# Patient Record
Sex: Male | Born: 1945 | Race: Black or African American | Hispanic: No | Smoking: Former smoker
Health system: Southern US, Community
[De-identification: ages and names within clinical notes are randomized; demographics above are authoritative.]

## PROBLEM LIST (undated history)

## (undated) DIAGNOSIS — K5190 Ulcerative colitis, unspecified, without complications: Secondary | ICD-10-CM

## (undated) DIAGNOSIS — E119 Type 2 diabetes mellitus without complications: Secondary | ICD-10-CM

## (undated) DIAGNOSIS — D509 Iron deficiency anemia, unspecified: Secondary | ICD-10-CM

## (undated) DIAGNOSIS — I10 Essential (primary) hypertension: Secondary | ICD-10-CM

## (undated) DIAGNOSIS — N189 Chronic kidney disease, unspecified: Secondary | ICD-10-CM

## (undated) DIAGNOSIS — M1990 Unspecified osteoarthritis, unspecified site: Secondary | ICD-10-CM

## (undated) DIAGNOSIS — S0990XA Unspecified injury of head, initial encounter: Secondary | ICD-10-CM

## (undated) DIAGNOSIS — T63331A Toxic effect of venom of brown recluse spider, accidental (unintentional), initial encounter: Secondary | ICD-10-CM

## (undated) DIAGNOSIS — J302 Other seasonal allergic rhinitis: Secondary | ICD-10-CM

## (undated) DIAGNOSIS — H5440 Blindness, one eye, unspecified eye: Secondary | ICD-10-CM

## (undated) DIAGNOSIS — E039 Hypothyroidism, unspecified: Secondary | ICD-10-CM

## (undated) DIAGNOSIS — Z8489 Family history of other specified conditions: Secondary | ICD-10-CM

## (undated) HISTORY — PX: VASCULAR SURGERY: SHX849

## (undated) HISTORY — PX: CARDIAC CATHETERIZATION: SHX172

## (undated) HISTORY — DX: Blindness, one eye, unspecified eye: H54.40

## (undated) HISTORY — PX: BACK SURGERY: SHX140

## (undated) HISTORY — PX: NASAL SINUS SURGERY: SHX719

## (undated) HISTORY — PX: COLONOSCOPY: SHX5424

## (undated) HISTORY — PX: APPENDECTOMY: SHX54

---
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
## 2013-01-16 DIAGNOSIS — T63331A Toxic effect of venom of brown recluse spider, accidental (unintentional), initial encounter: Secondary | ICD-10-CM

## 2013-01-16 HISTORY — DX: Toxic effect of venom of brown recluse spider, accidental (unintentional), initial encounter: T63.331A

## 2014-01-22 HISTORY — PX: SUBTOTAL COLECTOMY: SHX855

## 2014-01-26 HISTORY — PX: OTHER SURGICAL HISTORY: SHX169

## 2014-02-18 ENCOUNTER — Encounter (HOSPITAL_BASED_OUTPATIENT_CLINIC_OR_DEPARTMENT_OTHER): Payer: Medicare Other | Attending: Surgery

## 2014-02-18 DIAGNOSIS — Z9049 Acquired absence of other specified parts of digestive tract: Secondary | ICD-10-CM | POA: Insufficient documentation

## 2014-02-18 DIAGNOSIS — L97822 Non-pressure chronic ulcer of other part of left lower leg with fat layer exposed: Secondary | ICD-10-CM | POA: Diagnosis present

## 2014-02-18 DIAGNOSIS — M4628 Osteomyelitis of vertebra, sacral and sacrococcygeal region: Secondary | ICD-10-CM | POA: Diagnosis not present

## 2014-02-18 DIAGNOSIS — L89154 Pressure ulcer of sacral region, stage 4: Secondary | ICD-10-CM | POA: Diagnosis not present

## 2014-02-18 DIAGNOSIS — K5190 Ulcerative colitis, unspecified, without complications: Secondary | ICD-10-CM | POA: Diagnosis not present

## 2014-02-18 DIAGNOSIS — Z932 Ileostomy status: Secondary | ICD-10-CM | POA: Diagnosis not present

## 2014-02-18 DIAGNOSIS — E119 Type 2 diabetes mellitus without complications: Secondary | ICD-10-CM | POA: Diagnosis not present

## 2014-02-18 LAB — GLUCOSE, CAPILLARY: GLUCOSE-CAPILLARY: 153 mg/dL — AB (ref 70–99)

## 2014-02-19 NOTE — H&P (Signed)
NAME:  Gabriel Lynch, Gabriel Lynch             ACCOUNT NO.:  [id number]  MEDICAL RECORD NO.:  [id number]  LOCATION:  FOOT                         FACILITY:  [facility]  PHYSICIAN:  Christin Fudge, MD       DATE OF BIRTH:  1945/09/03  DATE OF ADMISSION:  02/18/2014 DATE OF DISCHARGE:                             HISTORY & PHYSICAL   This 69 year old gentleman comes for the first time to the [facility] for a consult recently been rehabbing in [place] in [place].  The patient comes with a chief complaint of having a large decubitus ulcer on the sacral area for about 2 months and a large lower extremity wound on the shin of the left leg for about 6 months.  HISTORY OF PRESENT ILLNESS:  The patient and his sister are the main historians and no papers were sent from the nursing home nor from his previous Psychologist, sport and exercise.  From what I can understand, about 6 months ago, the patient had on the left lower extremity what started off like a small insect bite or a spider bite, which rapidly progressed to a necrotic ulcer.  He was admitted to a hospital and had extensive debridement done of this area.  The patient was then seen in the [facility] locally and this may have been in [place], [place].  The patient continued to get wound care treatment as an outpatient and had hyperbaric treatment and was almost closed.  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  He also had some sacral decubitus problems for which he was operated, but none of these details are available.  All the surgery was approximately in the last month and a half.  The patient says he is now sent for rehab to a nursing home in [place] because his sister stays close by.  Other than that,  the patient is wheelchair bound and does not have any problems except some pain in his back and sides.  PAST MEDICAL HISTORY:  Suggestive of: 1. Ulcerative colitis. 2. Diabetes mellitus type 2. 3. Hypertension. 4. Iron-deficiency anemia. 5. Protein-calorie malnutrition. 6. Hypothyroidism.  PAST SURGICAL HISTORY:  He had a left leg stent with revascularization done in July 2015.  He also had his ileostomy and total colectomy done about a month and a half ago in [place].  ALLERGIES:  TO FLU VACCINES.  MEDICATIONS:  I have reviewed the list of his medications and they include Flomax, furosemide, glipizide, potassium, levothyroxine, amlodipine, metoprolol, MVI, Glucophage, ferrous gluconate, and oxycodone.  SOCIAL HISTORY:  Does not smoke or drink alcohol.  FAMILY HISTORY:  Nothing of significance.  REVIEW OF SYSTEMS:  Reveals that I have done a 12-point review of system and the only positive findings are that of having a painful wound on the left lower extremity and a large wound on the sacral region.  Other than that, he has no problems with eating and drinking and he passes his bowel movements okay in his ileostomy.  He does have some  mucus discharge from the anal canal.  He still has staples in the abdomen. Review of all other systems has been negative.  PHYSICAL EXAMINATION:  GENERAL:  On clinical examination, this is an elderly gentleman awake and alert, lying comfortably in bed. VITAL SIGNS:  Stable and he is afebrile.  He is 6 feet 1 inch in height, weight 151 pounds, afebrile, pulse 110, respirations 20, blood pressure of 141/78, and his blood glucose is 153. HEENT:  Oral cavity is normal.  Sclerae are normal. NECK:  Supple. CVS:  Normal heart sounds. RS:  Air entry equal at both bases. ABDOMEN:  Examination of the abdomen reveals that he still has plenty of staples in his midline wound and a few from laparoscopic trocar entry sites.  On the right lower  quadrant, he has an ileostomy, which is functioning well.  He has a Foley catheter into his penis and this is straining urine.  Other than that, abdomen is soft, nontender. RECTAL:  Deferred. EXTREMITIES:  Examination of the extremities reveals that on his left medial leg, he has an ulcer 4.8 x 1.7 x 0.1 cm in size.  On his leg lower down, he has got a small ulcer 0.6 x 0.8 x 0.1.  This is clean. The large ulcer has significant hypergranulation tissue.  On examination of the sacral region, he has a large pressure ulcer 7.5 x 6.5 x 4.4, and this is a stage IV pressure ulcer with palpable bone and necrotic slough at the base of the ulcer.  The rest of the ulcer has good granulation tissue and this is a nonhealing postsurgical scar.  No details of dressings are available.  The patient has had no other significant wounds open.  PROCEDURE NOTE:  As described to the patient, I have recommended the hypergranulation tissue should be treated with silver nitrate cauterization and he is agreeable.  Using a silver nitrate stick, I have cauterized the entire length of the ulcer on the left lower extremity and I have recommended a silver alginate with foam dressing for this wound.  As far as his sacral wound goes, there is no possibility of debridement as it is extremely tender and there is a lot of slough and this will be need to be debrided in the operating room.  I have recommended application of Kerlix at the present time and we will also apply Santyl to the base packet with Kerlix and recommend a negative pressure vacuum system drainage to be applied to this wound.  No other investigations are available at present time.  ASSESSMENT: 1. Hypergranulation tissue, left lower extremity from a large open     ulcer, which is possibly a nonhealing surgical wound.  I have     recommended silver nitrate and applied this and local dressing will     be applied there. 2. Large sacral decubitus ulcer stage  IV with bone and necrotic debris     at the base, possibly with osteomyelitis.  I have recommended that     the patient should be debrided in the operating room under general     anesthesia and this can be arranged either in [place] or at his     surgeon in [place]. 3. Status post recent surgery about a month ago with a total colectomy     and Hartmann's procedure with a right lower quadrant end-ileostomy.     The patient has several sutures and staples still intact in his     abdomen and these  have not been removed by his primary surgeon.  I     have recommended that they get in touch with his surgeons as soon     as possible and get this taken care of. 4. Ulcerative colitis. 5. Diabetes mellitus. The patient and his sister have had a thorough discussion with me.  They prefer to go back to their surgeon in [facility] for the debridement of his sacral decubitus ulcer.  I have also recommended that till that gets done at the nursing home, a wound VAC to be applied to the wound so that appropriate treatment can be done with the application of Santyl prior to application of the wound VAC.  The dressing for the left lower extremity has also been discussed with them.  If in any case they cannot get back to [facility], we can make arrangements for them to have this procedure done in our hospital at [facility], possibly with a referral to our plastic surgeons, Dr. Migdalia Dk or Dr. Iran Planas.  The patient and his sister have had all questions answered and they are agreeable about the treatment plan.          ______________________________ Christin Fudge, MD     EB/MEDQ  D:  02/18/2014  T:  02/19/2014  Job:  [id number]  cc:   [place]

## 2014-02-25 ENCOUNTER — Other Ambulatory Visit: Payer: Self-pay | Admitting: Certified Registered Nurse Anesthetist

## 2014-02-25 DIAGNOSIS — L89154 Pressure ulcer of sacral region, stage 4: Secondary | ICD-10-CM | POA: Diagnosis not present

## 2014-02-26 NOTE — H&P (Signed)
NAME:  Gabriel Lynch, Gabriel Lynch             ACCOUNT NO.:  [id number]  MEDICAL RECORD NO.:  [id number]  LOCATION:  FOOT                         FACILITY:  [facility]  PHYSICIAN:  Christin Fudge, MD       DATE OF BIRTH:  06/17/1945  DATE OF ADMISSION:  02/18/2014 DATE OF DISCHARGE:                             HISTORY & PHYSICAL   HISTORY OF PRESENT ILLNESS:  This pleasant 69 year old gentleman returns to see [place] with a left lower extremity ulceration and a large stage IV sacral decubitus ulceration of the sacral area.  We have some additional information since last week and most importantly, the patient has gone back for a visit to his surgeon, Dr. Carollee Sires at the [facility] at [place], [place].  Doctor saw him, removed his staples, and has agreed to release him from the present care.  The patient is going to follow up at the [facility] here.  Of note, the patient was admitted at that hospital between January 09, 2014, and January 29, 2014.  During that procedure, he had a flare-up of his ulcerative colitis and had a hand-assisted laparoscopic total colectomy with ileostomy done on January 22, 2014.  He also had a large infected sacral decubitus ulcer and was taken for debridement on January 26, 2014.  He has had transfusion and other supportive care as per the notes and details have been noted.  Other than that, the patient continues to rehab at the nursing home in [place] and is going to follow up with [place] in the [facility] here.  PHYSICAL EXAMINATION:  GENERAL:  On clinical examination today, he is awake and alert, lying comfortably in the bed. VITAL SIGNS:  He has a temperature of 99.3, pulse of 88, respirations of 17, blood pressure of 125/67, and his blood glucose was 114.  He is 6 feet 1 inch in height and 152 pounds in weight. ABDOMEN:  Examination reveals that his abdominal wound is now without staples and he has a functioning ileostomy.  He also has a  Foley catheter in situ. EXTREMITIES:  Examination of his extremities reveal that on the left lower extremity, he has an area 3.4 x 1.9 x 0.1 which is an open wound and shows hypergranulation.  He will need a procedure to be done on this.  On the sacral decubitus area, he has a large open wound 8.1 x 6.0 x 4.4, and has minimal slough and bone at the base of this.  He will need a procedure done here too.  PROCEDURE NOTE:  Under adequate local anesthesia, an appropriate time- out done, the left lower extremity hypergranulation tissue was treated with silver nitrate, chemical cauterization, and the dressing plans were for silver alginate and foam dressing and a light wrap.  As far as a sacral decubitus ulcer goes, the patient has bone visible and in view of that, I have done a debridement of the bone and done this with a rongeur.  The bone pieces have been sent both for culture and for pathology.  Hemostasis was achieved in this area with pressure and with silver nitrate.  Today, we will continue applying Santyl and a packing, but the patient  has a wound VAC available at the nursing home.  ASSESSMENT: 1. Sacral decubitus ulcer stage IV with bone at the base of it,     suspect osteomyelitis. 2. Left lower extremity ulceration with a large open wound nonhealing     with hypergranulation tissue. 3. Status post recent total colectomy and ileostomy. 4. Ulcerative colitis. 5. Diabetes mellitus.  PLAN AND RECOMMENDATIONS:  As noted above, we will continue applying silver alginate and a local dressing to his left lower extremity.  As far as his sacral decubitus goes, we will apply Santyl and then apply white foam over the bone and appropriate foam for the rest of his wound and apply a wound VAC.  He will continue to see [place] in the [facility] and when it is an appropriate time, we will refer him to the plastic surgeons.  The patient and his sister who is his caregiver understand the  treatment plan and agreeable.          ______________________________ Christin Fudge, MD     EB/MEDQ  D:  02/25/2014  T:  02/26/2014  Job:  [id number]  cc:   Wound Care Office

## 2014-03-04 DIAGNOSIS — A419 Sepsis, unspecified organism: Secondary | ICD-10-CM | POA: Diagnosis not present

## 2014-03-04 DIAGNOSIS — E872 Acidosis: Secondary | ICD-10-CM | POA: Diagnosis not present

## 2014-03-05 NOTE — H&P (Signed)
NAME:  Gabriel Lynch, Gabriel Lynch             ACCOUNT NO.:  [id number]  MEDICAL RECORD NO.:  [id number]  LOCATION:  FOOT                         FACILITY:  [facility]  PHYSICIAN:  Christin Fudge, MD       DATE OF BIRTH:  Jun 12, 1945  DATE OF ADMISSION:  02/18/2014 DATE OF DISCHARGE:                             HISTORY & PHYSICAL   This 69 year old gentleman comes for a followup regarding his sacral decubitus wound and his wound on his left lower extremity which has been treated by me a couple of weeks.  He is in a nursing home and as per his sister he is getting a little weak and sleeping a lot and she finds him less alert.  Other than that, details of his previous procedures have been noted in my dictation of February 25, 2014 and no fresh reports are available.  The patient does complain that it is difficult for him to sleep on his sides but he continues to do what he requires to do.  On clinical examination, he is awake and fairly alert but seems to be a little disoriented in his conversation.  He is 6 feet, 152 pounds.  He is afebrile, pulse is 102, respirations 20, and blood pressure 122/67. His blood glucose was 124.  On examination of his left lower extremity, he has a couple of areas and the cluster is 1.2 x 0.8 x 0.1. Superiorly, he has another which is 0.5 x 0.5 x 0.1.  This does not need debridement on his left lower extremity.  His sacral decubitus ulcer is 8.8 x 5.6 x 4.1, and has a lot of slough at the depths and lot of necrotic tissue.  This needs sharp debridement.  PROCEDURE:  After an appropriate time-out and application of lidocaine, all the necrotic tissue excised and this was approximately 3 cm x 2 cm. The post debridement measurement is 8.8 x 5.6 x 4.1.  There was brisk bleeding from some of the cut edges and this was controlled with pressure and with silver nitrate.  The patient tolerated the procedure with some complaints of pain on retracting his skin.  Other than  that, there is no other change.  Of note, last week, when he was here, the following investigations were done.  A bone biopsy from the region of his sacral wound showed acute osteomyelitis.  This was reported date of February 27, 2014.  As far as the culture taken from the wound, it did not grow any significant culture and sensitivity.  CLINICAL IMPRESSION: 1. 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 2. Left lower extremity open ulcer from nonhealing surgical wound     which now looks clean. 3. Status post total colectomy with end ileostomy. 4. Diabetes mellitus.  PLAN AND RECOMMENDATION:  We will continue dressing his left lower extremity with silver alginate and a light wrap.  As far as his sacral decubitus ulcer goes, we will continue with Santyl and then with appropriate white foam, we can use a wound VAC as before.  The patient also needs to be referred to an Infectious Disease specialist regarding his osteomyelitis of the sacrum.  This will be done and I have asked him to followup with [place] in 2 week's time after appropriate management of his Infectious Disease recommendation and continue with the wound VAC.  All questions have been answered, and he will see [place] back in 2 weeks.          ______________________________ Christin Fudge, MD     EB/MEDQ  D:  03/04/2014  T:  03/05/2014  Job:  [id number]

## 2014-03-06 ENCOUNTER — Inpatient Hospital Stay (HOSPITAL_COMMUNITY): Payer: Medicare Other

## 2014-03-06 ENCOUNTER — Encounter (HOSPITAL_COMMUNITY): Payer: Self-pay | Admitting: *Deleted

## 2014-03-06 ENCOUNTER — Inpatient Hospital Stay (HOSPITAL_COMMUNITY)
Admission: EM | Admit: 2014-03-06 | Discharge: 2014-03-10 | DRG: 853 | Disposition: A | Discharge status: Other Acute Inpatient Hospital | Payer: Medicare Other | Attending: Internal Medicine | Admitting: Internal Medicine

## 2014-03-06 ENCOUNTER — Emergency Department (HOSPITAL_COMMUNITY): Payer: Medicare Other

## 2014-03-06 DIAGNOSIS — E11649 Type 2 diabetes mellitus with hypoglycemia without coma: Secondary | ICD-10-CM | POA: Diagnosis not present

## 2014-03-06 DIAGNOSIS — R680 Hypothermia, not associated with low environmental temperature: Secondary | ICD-10-CM | POA: Diagnosis not present

## 2014-03-06 DIAGNOSIS — K8000 Calculus of gallbladder with acute cholecystitis without obstruction: Secondary | ICD-10-CM | POA: Diagnosis present

## 2014-03-06 DIAGNOSIS — K5190 Ulcerative colitis, unspecified, without complications: Secondary | ICD-10-CM | POA: Diagnosis present

## 2014-03-06 DIAGNOSIS — E039 Hypothyroidism, unspecified: Secondary | ICD-10-CM | POA: Diagnosis present

## 2014-03-06 DIAGNOSIS — L89154 Pressure ulcer of sacral region, stage 4: Secondary | ICD-10-CM | POA: Diagnosis present

## 2014-03-06 DIAGNOSIS — M869 Osteomyelitis, unspecified: Secondary | ICD-10-CM | POA: Diagnosis present

## 2014-03-06 DIAGNOSIS — E860 Dehydration: Secondary | ICD-10-CM | POA: Diagnosis present

## 2014-03-06 DIAGNOSIS — E875 Hyperkalemia: Secondary | ICD-10-CM

## 2014-03-06 DIAGNOSIS — T8089XA Other complications following infusion, transfusion and therapeutic injection, initial encounter: Secondary | ICD-10-CM | POA: Diagnosis not present

## 2014-03-06 DIAGNOSIS — L723 Sebaceous cyst: Secondary | ICD-10-CM | POA: Diagnosis present

## 2014-03-06 DIAGNOSIS — Y841 Kidney dialysis as the cause of abnormal reaction of the patient, or of later complication, without mention of misadventure at the time of the procedure: Secondary | ICD-10-CM | POA: Diagnosis not present

## 2014-03-06 DIAGNOSIS — N390 Urinary tract infection, site not specified: Secondary | ICD-10-CM | POA: Diagnosis present

## 2014-03-06 DIAGNOSIS — N179 Acute kidney failure, unspecified: Secondary | ICD-10-CM

## 2014-03-06 DIAGNOSIS — J9601 Acute respiratory failure with hypoxia: Secondary | ICD-10-CM | POA: Diagnosis present

## 2014-03-06 DIAGNOSIS — Z22322 Carrier or suspected carrier of Methicillin resistant Staphylococcus aureus: Secondary | ICD-10-CM

## 2014-03-06 DIAGNOSIS — W19XXXA Unspecified fall, initial encounter: Secondary | ICD-10-CM

## 2014-03-06 DIAGNOSIS — Z79899 Other long term (current) drug therapy: Secondary | ICD-10-CM | POA: Diagnosis not present

## 2014-03-06 DIAGNOSIS — A419 Sepsis, unspecified organism: Secondary | ICD-10-CM | POA: Diagnosis present

## 2014-03-06 DIAGNOSIS — G9340 Encephalopathy, unspecified: Secondary | ICD-10-CM | POA: Diagnosis present

## 2014-03-06 DIAGNOSIS — E872 Acidosis, unspecified: Secondary | ICD-10-CM | POA: Insufficient documentation

## 2014-03-06 DIAGNOSIS — Z932 Ileostomy status: Secondary | ICD-10-CM | POA: Diagnosis not present

## 2014-03-06 DIAGNOSIS — Z993 Dependence on wheelchair: Secondary | ICD-10-CM

## 2014-03-06 DIAGNOSIS — R6521 Severe sepsis with septic shock: Secondary | ICD-10-CM | POA: Diagnosis present

## 2014-03-06 DIAGNOSIS — D638 Anemia in other chronic diseases classified elsewhere: Secondary | ICD-10-CM | POA: Diagnosis present

## 2014-03-06 DIAGNOSIS — Y848 Other medical procedures as the cause of abnormal reaction of the patient, or of later complication, without mention of misadventure at the time of the procedure: Secondary | ICD-10-CM | POA: Diagnosis not present

## 2014-03-06 DIAGNOSIS — T829XXA Unspecified complication of cardiac and vascular prosthetic device, implant and graft, initial encounter: Secondary | ICD-10-CM | POA: Insufficient documentation

## 2014-03-06 DIAGNOSIS — R4182 Altered mental status, unspecified: Secondary | ICD-10-CM

## 2014-03-06 DIAGNOSIS — T82818A Embolism of vascular prosthetic devices, implants and grafts, initial encounter: Secondary | ICD-10-CM | POA: Diagnosis not present

## 2014-03-06 DIAGNOSIS — K810 Acute cholecystitis: Secondary | ICD-10-CM | POA: Diagnosis present

## 2014-03-06 DIAGNOSIS — T68XXXA Hypothermia, initial encounter: Secondary | ICD-10-CM

## 2014-03-06 DIAGNOSIS — N170 Acute kidney failure with tubular necrosis: Secondary | ICD-10-CM | POA: Diagnosis present

## 2014-03-06 DIAGNOSIS — M7989 Other specified soft tissue disorders: Secondary | ICD-10-CM | POA: Diagnosis present

## 2014-03-06 DIAGNOSIS — I10 Essential (primary) hypertension: Secondary | ICD-10-CM | POA: Diagnosis present

## 2014-03-06 HISTORY — DX: Ulcerative colitis, unspecified, without complications: K51.90

## 2014-03-06 HISTORY — DX: Iron deficiency anemia, unspecified: D50.9

## 2014-03-06 HISTORY — DX: Essential (primary) hypertension: I10

## 2014-03-06 HISTORY — DX: Type 2 diabetes mellitus without complications: E11.9

## 2014-03-06 HISTORY — DX: Hypothyroidism, unspecified: E03.9

## 2014-03-06 LAB — POCT I-STAT 7, (LYTES, BLD GAS, ICA,H+H)
ACID-BASE DEFICIT: 17 mmol/L — AB (ref 0.0–2.0)
BICARBONATE: 10.6 meq/L — AB (ref 20.0–24.0)
Calcium, Ion: 1.45 mmol/L — ABNORMAL HIGH (ref 1.13–1.30)
HEMATOCRIT: 25 % — AB (ref 39.0–52.0)
HEMOGLOBIN: 8.5 g/dL — AB (ref 13.0–17.0)
O2 Saturation: 96 %
Patient temperature: 83
Potassium: 5.4 mmol/L — ABNORMAL HIGH (ref 3.5–5.1)
Sodium: 130 mmol/L — ABNORMAL LOW (ref 135–145)
TCO2: 12 mmol/L (ref 0–100)
pCO2 arterial: 21.8 mmHg — ABNORMAL LOW (ref 35.0–45.0)
pH, Arterial: 7.243 — ABNORMAL LOW (ref 7.350–7.450)
pO2, Arterial: 68 mmHg — ABNORMAL LOW (ref 80.0–100.0)

## 2014-03-06 LAB — DIFFERENTIAL
BASOS PCT: 0 % (ref 0–1)
Basophils Absolute: 0 10*3/uL (ref 0.0–0.1)
EOS PCT: 0 % (ref 0–5)
Eosinophils Absolute: 0 10*3/uL (ref 0.0–0.7)
LYMPHS ABS: 2.1 10*3/uL (ref 0.7–4.0)
Lymphocytes Relative: 4 % — ABNORMAL LOW (ref 12–46)
MONOS PCT: 8 % (ref 3–12)
Monocytes Absolute: 4.2 10*3/uL — ABNORMAL HIGH (ref 0.1–1.0)
NEUTROS ABS: 46.3 10*3/uL — AB (ref 1.7–7.7)
Neutrophils Relative %: 88 % — ABNORMAL HIGH (ref 43–77)

## 2014-03-06 LAB — CBC
HCT: 23.1 % — ABNORMAL LOW (ref 39.0–52.0)
HEMATOCRIT: 30.8 % — AB (ref 39.0–52.0)
HEMOGLOBIN: 8.8 g/dL — AB (ref 13.0–17.0)
Hemoglobin: 7 g/dL — ABNORMAL LOW (ref 13.0–17.0)
MCH: 27.2 pg (ref 26.0–34.0)
MCH: 27.8 pg (ref 26.0–34.0)
MCHC: 28.6 g/dL — ABNORMAL LOW (ref 30.0–36.0)
MCHC: 30.3 g/dL (ref 30.0–36.0)
MCV: 95.4 fL (ref 78.0–100.0)
MCV: 91.7 fL (ref 78.0–100.0)
PLATELETS: 699 10*3/uL — AB (ref 150–400)
Platelets: 1036 10*3/uL (ref 150–400)
RBC: 3.23 MIL/uL — ABNORMAL LOW (ref 4.22–5.81)
RBC: 2.52 MIL/uL — ABNORMAL LOW (ref 4.22–5.81)
RDW: 17.4 % — ABNORMAL HIGH (ref 11.5–15.5)
RDW: 17 % — ABNORMAL HIGH (ref 11.5–15.5)
WBC: 52.6 10*3/uL (ref 4.0–10.5)
WBC: 49 10*3/uL — AB (ref 4.0–10.5)

## 2014-03-06 LAB — RAPID URINE DRUG SCREEN, HOSP PERFORMED
Amphetamines: NOT DETECTED
BENZODIAZEPINES: NOT DETECTED
Barbiturates: NOT DETECTED
Cocaine: NOT DETECTED
Opiates: NOT DETECTED
TETRAHYDROCANNABINOL: NOT DETECTED

## 2014-03-06 LAB — URINE MICROSCOPIC-ADD ON

## 2014-03-06 LAB — MRSA PCR SCREENING: MRSA BY PCR: POSITIVE — AB

## 2014-03-06 LAB — GLUCOSE, CAPILLARY
GLUCOSE-CAPILLARY: 154 mg/dL — AB (ref 70–99)
GLUCOSE-CAPILLARY: 121 mg/dL — AB (ref 70–99)
Glucose-Capillary: 303 mg/dL — ABNORMAL HIGH (ref 70–99)
Glucose-Capillary: 201 mg/dL — ABNORMAL HIGH (ref 70–99)
Glucose-Capillary: 103 mg/dL — ABNORMAL HIGH (ref 70–99)
Glucose-Capillary: 115 mg/dL — ABNORMAL HIGH (ref 70–99)
Glucose-Capillary: 81 mg/dL (ref 70–99)
Glucose-Capillary: 71 mg/dL (ref 70–99)

## 2014-03-06 LAB — RENAL FUNCTION PANEL
ALBUMIN: 2 g/dL — AB (ref 3.5–5.2)
Anion gap: 24 — ABNORMAL HIGH (ref 5–15)
BUN: 94 mg/dL — ABNORMAL HIGH (ref 6–23)
CO2: 13 mmol/L — ABNORMAL LOW (ref 19–32)
Calcium: 9.8 mg/dL (ref 8.4–10.5)
Chloride: 96 mmol/L (ref 96–112)
Creatinine, Ser: 6.07 mg/dL — ABNORMAL HIGH (ref 0.50–1.35)
GFR calc Af Amer: 10 mL/min — ABNORMAL LOW (ref >90)
GFR calc non Af Amer: 9 mL/min — ABNORMAL LOW (ref >90)
Glucose, Bld: 236 mg/dL — ABNORMAL HIGH (ref 70–99)
PHOSPHORUS: 7.6 mg/dL — AB (ref 2.3–4.6)
Potassium: 5.5 mmol/L — ABNORMAL HIGH (ref 3.5–5.1)
SODIUM: 133 mmol/L — AB (ref 135–145)

## 2014-03-06 LAB — BLOOD GAS, ARTERIAL
Acid-base deficit: 14 mmol/L — ABNORMAL HIGH (ref 0.0–2.0)
BICARBONATE: 11.6 meq/L — AB (ref 20.0–24.0)
Drawn by: 39899
FIO2: 0.3 %
MECHVT: 600 mL
O2 Saturation: 97.2 %
PEEP: 5 cmH2O
PH ART: 7.268 — AB (ref 7.350–7.450)
PO2 ART: 113 mmHg — AB (ref 80.0–100.0)
Patient temperature: 97.9
RATE: 18 resp/min
TCO2: 12.4 mmol/L (ref 0–100)
pCO2 arterial: 26.2 mmHg — ABNORMAL LOW (ref 35.0–45.0)

## 2014-03-06 LAB — COMPREHENSIVE METABOLIC PANEL
ALT: 20 U/L (ref 0–53)
AST: 27 U/L (ref 0–37)
Albumin: 2.6 g/dL — ABNORMAL LOW (ref 3.5–5.2)
Alkaline Phosphatase: 296 U/L — ABNORMAL HIGH (ref 39–117)
Anion gap: 25 — ABNORMAL HIGH (ref 5–15)
BUN: 118 mg/dL — ABNORMAL HIGH (ref 6–23)
CALCIUM: 10.7 mg/dL — AB (ref 8.4–10.5)
CO2: 7 mmol/L — CL (ref 19–32)
CREATININE: 8.59 mg/dL — AB (ref 0.50–1.35)
Chloride: 96 mmol/L (ref 96–112)
GFR calc Af Amer: 7 mL/min — ABNORMAL LOW (ref >90)
GFR, EST NON AFRICAN AMERICAN: 6 mL/min — AB (ref >90)
Glucose, Bld: 185 mg/dL — ABNORMAL HIGH (ref 70–99)
Potassium: >7.5 mmol/L (ref 3.5–5.1)
SODIUM: 128 mmol/L — AB (ref 135–145)
Total Bilirubin: 0.3 mg/dL (ref 0.3–1.2)
Total Protein: 7.6 g/dL (ref 6.0–8.3)

## 2014-03-06 LAB — I-STAT ARTERIAL BLOOD GAS, ED
ACID-BASE DEFICIT: 28 mmol/L — AB (ref 0.0–2.0)
Acid-base deficit: 28 mmol/L — ABNORMAL HIGH (ref 0.0–2.0)
BICARBONATE: 4.5 meq/L — AB (ref 20.0–24.0)
Bicarbonate: 5 mEq/L — ABNORMAL LOW (ref 20.0–24.0)
O2 SAT: 100 %
O2 SAT: 99 %
PCO2 ART: 33.4 mmHg — AB (ref 35.0–45.0)
PH ART: 6.785 — AB (ref 7.350–7.450)
PO2 ART: 540 mmHg — AB (ref 80.0–100.0)
TCO2: 6 mmol/L (ref 0–100)
TCO2: 5 mmol/L (ref 0–100)
pCO2 arterial: 21.9 mmHg — ABNORMAL LOW (ref 35.0–45.0)
pH, Arterial: 6.87 — CL (ref 7.350–7.450)
pO2, Arterial: 195 mmHg — ABNORMAL HIGH (ref 80.0–100.0)

## 2014-03-06 LAB — BASIC METABOLIC PANEL
ANION GAP: 19 — AB (ref 5–15)
Anion gap: 23 — ABNORMAL HIGH (ref 5–15)
BUN: 105 mg/dL — ABNORMAL HIGH (ref 6–23)
BUN: 70 mg/dL — AB (ref 6–23)
CO2: 9 mmol/L — AB (ref 19–32)
CO2: 19 mmol/L (ref 19–32)
Calcium: 10.8 mg/dL — ABNORMAL HIGH (ref 8.4–10.5)
Calcium: 8.6 mg/dL (ref 8.4–10.5)
Chloride: 105 mmol/L (ref 96–112)
Chloride: 90 mmol/L — ABNORMAL LOW (ref 96–112)
Creatinine, Ser: 6.75 mg/dL — ABNORMAL HIGH (ref 0.50–1.35)
Creatinine, Ser: 4.39 mg/dL — ABNORMAL HIGH (ref 0.50–1.35)
GFR calc Af Amer: 9 mL/min — ABNORMAL LOW (ref >90)
GFR calc Af Amer: 15 mL/min — ABNORMAL LOW (ref >90)
GFR calc non Af Amer: 8 mL/min — ABNORMAL LOW (ref >90)
GFR calc non Af Amer: 13 mL/min — ABNORMAL LOW (ref >90)
GLUCOSE: 439 mg/dL — AB (ref 70–99)
Glucose, Bld: 67 mg/dL — ABNORMAL LOW (ref 70–99)
Potassium: 6.2 mmol/L (ref 3.5–5.1)
Potassium: 4.2 mmol/L (ref 3.5–5.1)
SODIUM: 133 mmol/L — AB (ref 135–145)
SODIUM: 132 mmol/L — AB (ref 135–145)

## 2014-03-06 LAB — ETHANOL: Alcohol, Ethyl (B): <5 mg/dL (ref 0–9)

## 2014-03-06 LAB — LACTIC ACID, PLASMA
LACTIC ACID, VENOUS: 8.9 mmol/L — AB (ref 0.5–2.0)
Lactic Acid, Venous: 8.6 mmol/L (ref 0.5–2.0)

## 2014-03-06 LAB — I-STAT TROPONIN, ED: TROPONIN I, POC: 0.03 ng/mL (ref 0.00–0.08)

## 2014-03-06 LAB — URINALYSIS, ROUTINE W REFLEX MICROSCOPIC
BILIRUBIN URINE: NEGATIVE
Glucose, UA: 250 mg/dL — AB
KETONES UR: 15 mg/dL — AB
NITRITE: NEGATIVE
Protein, ur: >300 mg/dL — AB
Specific Gravity, Urine: 1.036 — ABNORMAL HIGH (ref 1.005–1.030)
UROBILINOGEN UA: 0.2 mg/dL (ref 0.0–1.0)
pH: 7 (ref 5.0–8.0)

## 2014-03-06 LAB — CBG MONITORING, ED
GLUCOSE-CAPILLARY: 254 mg/dL — AB (ref 70–99)
Glucose-Capillary: 358 mg/dL — ABNORMAL HIGH (ref 70–99)

## 2014-03-06 LAB — CARBOXYHEMOGLOBIN
Carboxyhemoglobin: 0.7 % (ref 0.5–1.5)
Methemoglobin: 1.6 % — ABNORMAL HIGH (ref 0.0–1.5)
O2 SAT: 72.2 %
TOTAL HEMOGLOBIN: 8.9 g/dL — AB (ref 13.5–18.0)

## 2014-03-06 LAB — I-STAT CG4 LACTIC ACID, ED
LACTIC ACID, VENOUS: 12.3 mmol/L — AB (ref 0.5–2.0)
LACTIC ACID, VENOUS: 10.7 mmol/L — AB (ref 0.5–2.0)

## 2014-03-06 LAB — TSH: TSH: 1.174 u[IU]/mL (ref 0.350–4.500)

## 2014-03-06 LAB — PROTIME-INR
INR: 1.54 — AB (ref 0.00–1.49)
Prothrombin Time: 18.6 seconds — ABNORMAL HIGH (ref 11.6–15.2)

## 2014-03-06 LAB — APTT: aPTT: 42 seconds — ABNORMAL HIGH (ref 24–37)

## 2014-03-06 LAB — ABO/RH: ABO/RH(D): A POS

## 2014-03-06 LAB — TROPONIN I
TROPONIN I: 0.05 ng/mL — AB (ref <0.031)
Troponin I: <0.03 ng/mL (ref <0.031)
Troponin I: 0.05 ng/mL — ABNORMAL HIGH (ref <0.031)

## 2014-03-06 LAB — PROCALCITONIN: PROCALCITONIN: 8.31 ng/mL

## 2014-03-06 MED ORDER — VASOPRESSIN 20 UNIT/ML IV SOLN
0.0300 [IU]/min | INTRAVENOUS | Status: DC
  Administered 2014-03-06 (×2): 0.03 [IU]/min via INTRAVENOUS
  Filled 2014-03-06 (×2): qty 2

## 2014-03-06 MED ORDER — DEXTROSE 50 % IV SOLN
50.0000 mL | Freq: Once | INTRAVENOUS | Status: AC
  Administered 2014-03-06: 50 mL via INTRAVENOUS
  Filled 2014-03-06: qty 50

## 2014-03-06 MED ORDER — INSULIN ASPART 100 UNIT/ML ~~LOC~~ SOLN
10.0000 [IU] | Freq: Once | SUBCUTANEOUS | Status: AC
  Administered 2014-03-06: 10 [IU] via INTRAVENOUS
  Filled 2014-03-06: qty 1

## 2014-03-06 MED ORDER — ROCURONIUM BROMIDE 50 MG/5ML IV SOLN
INTRAVENOUS | Status: DC | PRN
  Administered 2014-03-06: 80 mg via INTRAVENOUS

## 2014-03-06 MED ORDER — HEPARIN BOLUS VIA INFUSION (CRRT)
1000.0000 [IU] | INTRAVENOUS | Status: DC | PRN
  Administered 2014-03-06 (×6): 1000 [IU] via INTRAVENOUS_CENTRAL
  Filled 2014-03-06 (×7): qty 1000

## 2014-03-06 MED ORDER — INSULIN ASPART 100 UNIT/ML ~~LOC~~ SOLN
2.0000 [IU] | SUBCUTANEOUS | Status: DC
  Administered 2014-03-06: 6 [IU] via SUBCUTANEOUS

## 2014-03-06 MED ORDER — VANCOMYCIN HCL IN DEXTROSE 750-5 MG/150ML-% IV SOLN
750.0000 mg | INTRAVENOUS | Status: DC
  Administered 2014-03-07 – 2014-03-09 (×3): 750 mg via INTRAVENOUS
  Filled 2014-03-06 (×3): qty 150

## 2014-03-06 MED ORDER — PRISMASOL BGK 4/2.5 32-4-2.5 MEQ/L IV SOLN
INTRAVENOUS | Status: DC
  Administered 2014-03-07 – 2014-03-08 (×8): via INTRAVENOUS_CENTRAL
  Filled 2014-03-06 (×25): qty 5000

## 2014-03-06 MED ORDER — PIPERACILLIN-TAZOBACTAM 3.375 G IVPB: 3.3750 g | Freq: Once | INTRAVENOUS | Status: DC

## 2014-03-06 MED ORDER — DEXTROSE 50 % IV SOLN
50.0000 mL | Freq: Once | INTRAVENOUS | Status: AC
  Administered 2014-03-06: 50 mL via INTRAVENOUS

## 2014-03-06 MED ORDER — HEPARIN SODIUM (PORCINE) 5000 UNIT/ML IJ SOLN
5000.0000 [IU] | Freq: Three times a day (TID) | INTRAMUSCULAR | Status: DC
  Administered 2014-03-06 – 2014-03-07 (×5): 5000 [IU] via SUBCUTANEOUS
  Filled 2014-03-06 (×10): qty 1

## 2014-03-06 MED ORDER — CHLORHEXIDINE GLUCONATE 0.12 % MT SOLN
15.0000 mL | Freq: Two times a day (BID) | OROMUCOSAL | Status: DC
  Administered 2014-03-06 – 2014-03-08 (×4): 15 mL via OROMUCOSAL
  Filled 2014-03-06 (×4): qty 15

## 2014-03-06 MED ORDER — SODIUM CHLORIDE 0.9 % IV BOLUS (SEPSIS)
2000.0000 mL | Freq: Once | INTRAVENOUS | Status: AC
  Administered 2014-03-06: 2000 mL via INTRAVENOUS

## 2014-03-06 MED ORDER — SODIUM CHLORIDE 0.9 % IV SOLN: 250.0000 mL | INTRAVENOUS | Status: DC | PRN

## 2014-03-06 MED ORDER — SODIUM CHLORIDE 0.9 % IJ SOLN
250.0000 [IU]/h | INTRAMUSCULAR | Status: DC
  Administered 2014-03-06: 450 [IU]/h via INTRAVENOUS_CENTRAL
  Administered 2014-03-06: 1550 [IU]/h via INTRAVENOUS_CENTRAL
  Administered 2014-03-07: 2150 [IU]/h via INTRAVENOUS_CENTRAL
  Administered 2014-03-07 (×2): 1650 [IU]/h via INTRAVENOUS_CENTRAL
  Administered 2014-03-07: 1750 [IU]/h via INTRAVENOUS_CENTRAL
  Administered 2014-03-08: 2200 [IU]/h via INTRAVENOUS_CENTRAL
  Filled 2014-03-06 (×10): qty 2

## 2014-03-06 MED ORDER — VASOPRESSIN 20 UNIT/ML IV SOLN
0.2000 [IU]/min | INTRAVENOUS | Status: DC
  Filled 2014-03-06: qty 5

## 2014-03-06 MED ORDER — HEPARIN SODIUM (PORCINE) 1000 UNIT/ML DIALYSIS
1000.0000 [IU] | INTRAMUSCULAR | Status: DC | PRN
  Filled 2014-03-06: qty 6
  Filled 2014-03-06: qty 3

## 2014-03-06 MED ORDER — DEXTROSE 50 % IV SOLN
INTRAVENOUS | Status: AC
  Administered 2014-03-06: 50 mL via INTRAVENOUS
  Filled 2014-03-06: qty 50

## 2014-03-06 MED ORDER — SODIUM BICARBONATE 8.4 % IV SOLN
100.0000 meq | Freq: Once | INTRAVENOUS | Status: AC
  Administered 2014-03-06: 100 meq via INTRAVENOUS

## 2014-03-06 MED ORDER — LIDOCAINE HCL 1 % IJ SOLN
INTRAMUSCULAR | Status: AC
  Filled 2014-03-06: qty 20

## 2014-03-06 MED ORDER — PIPERACILLIN-TAZOBACTAM 3.375 G IVPB 30 MIN
3.3750 g | Freq: Once | INTRAVENOUS | Status: AC
Start: 2014-03-06 — End: 2014-03-06
  Administered 2014-03-06: 3.375 g via INTRAVENOUS
  Filled 2014-03-06: qty 50

## 2014-03-06 MED ORDER — ETOMIDATE 2 MG/ML IV SOLN
INTRAVENOUS | Status: DC | PRN
  Administered 2014-03-06: 10 mg via INTRAVENOUS

## 2014-03-06 MED ORDER — SODIUM CHLORIDE 0.9 % IV BOLUS (SEPSIS): 1000.0000 mL | Freq: Once | INTRAVENOUS | Status: DC

## 2014-03-06 MED ORDER — SODIUM BICARBONATE 8.4 % IV SOLN
50.0000 meq | Freq: Once | INTRAVENOUS | Status: AC
  Administered 2014-03-06: 50 meq via INTRAVENOUS

## 2014-03-06 MED ORDER — VASOPRESSIN 20 UNIT/ML IV SOLN: 0.0300 [IU]/min | INTRAVENOUS | Status: DC

## 2014-03-06 MED ORDER — SODIUM CHLORIDE 0.9 % IV BOLUS (SEPSIS)
1000.0000 mL | Freq: Once | INTRAVENOUS | Status: AC
  Administered 2014-03-06: 1000 mL via INTRAVENOUS

## 2014-03-06 MED ORDER — CEFEPIME HCL 2 G IJ SOLR
2.0000 g | Freq: Two times a day (BID) | INTRAMUSCULAR | Status: DC
  Administered 2014-03-06 – 2014-03-09 (×6): 2 g via INTRAVENOUS
  Filled 2014-03-06 (×7): qty 2

## 2014-03-06 MED ORDER — STERILE WATER FOR INJECTION IV SOLN
INTRAVENOUS | Status: DC
  Administered 2014-03-06 – 2014-03-07 (×3): via INTRAVENOUS_CENTRAL
  Filled 2014-03-06 (×7): qty 150

## 2014-03-06 MED ORDER — MUPIROCIN 2 % EX OINT
1.0000 "application " | TOPICAL_OINTMENT | Freq: Two times a day (BID) | CUTANEOUS | Status: DC
  Administered 2014-03-06 – 2014-03-10 (×9): 1 via NASAL
  Filled 2014-03-06 (×3): qty 22

## 2014-03-06 MED ORDER — PANTOPRAZOLE SODIUM 40 MG IV SOLR
40.0000 mg | Freq: Every day | INTRAVENOUS | Status: DC
  Administered 2014-03-06 – 2014-03-07 (×2): 40 mg via INTRAVENOUS
  Filled 2014-03-06 (×3): qty 40

## 2014-03-06 MED ORDER — HEPARIN SODIUM (PORCINE) 1000 UNIT/ML DIALYSIS
1000.0000 [IU] | INTRAMUSCULAR | Status: DC | PRN
  Filled 2014-03-06: qty 6

## 2014-03-06 MED ORDER — SODIUM CHLORIDE 0.9 % IV SOLN
INTRAVENOUS | Status: DC
  Administered 2014-03-06: 1.4 [IU]/h via INTRAVENOUS
  Filled 2014-03-06: qty 2.5

## 2014-03-06 MED ORDER — CHLORHEXIDINE GLUCONATE CLOTH 2 % EX PADS
6.0000 | MEDICATED_PAD | Freq: Every day | CUTANEOUS | Status: DC
  Administered 2014-03-06 – 2014-03-10 (×4): 6 via TOPICAL

## 2014-03-06 MED ORDER — HYDROCORTISONE NA SUCCINATE PF 100 MG IJ SOLR
100.0000 mg | Freq: Once | INTRAMUSCULAR | Status: AC
  Administered 2014-03-06: 100 mg via INTRAVENOUS
  Filled 2014-03-06: qty 2

## 2014-03-06 MED ORDER — CETYLPYRIDINIUM CHLORIDE 0.05 % MT LIQD
7.0000 mL | Freq: Four times a day (QID) | OROMUCOSAL | Status: DC
  Administered 2014-03-07 – 2014-03-08 (×7): 7 mL via OROMUCOSAL

## 2014-03-06 MED ORDER — STERILE WATER FOR INJECTION IV SOLN
INTRAVENOUS | Status: DC
  Administered 2014-03-06 – 2014-03-07 (×5): via INTRAVENOUS_CENTRAL
  Filled 2014-03-06 (×9): qty 150

## 2014-03-06 MED ORDER — SODIUM CHLORIDE 0.9 % IV SOLN: INTRAVENOUS | Status: DC

## 2014-03-06 MED ORDER — HEPARIN (PORCINE) 2000 UNITS/L FOR CRRT
INTRAVENOUS_CENTRAL | Status: DC | PRN
  Administered 2014-03-06: 16:00:00 via INTRAVENOUS_CENTRAL
  Filled 2014-03-06 (×2): qty 1000

## 2014-03-06 MED ORDER — PRISMASOL BGK 4/2.5 32-4-2.5 MEQ/L IV SOLN
INTRAVENOUS | Status: DC
Start: 2014-03-06 — End: 2014-03-06
  Administered 2014-03-06: via INTRAVENOUS_CENTRAL
  Filled 2014-03-06 (×4): qty 5000

## 2014-03-06 MED ORDER — SODIUM POLYSTYRENE SULFONATE 15 GM/60ML PO SUSP: 60.0000 g | Freq: Three times a day (TID) | ORAL | Status: AC

## 2014-03-06 MED ORDER — SODIUM CHLORIDE 0.9 % IV SOLN
1250.0000 mg | Freq: Once | INTRAVENOUS | Status: AC
  Administered 2014-03-06: 1250 mg via INTRAVENOUS
  Filled 2014-03-06: qty 1250

## 2014-03-06 MED ORDER — INSULIN ASPART 100 UNIT/ML ~~LOC~~ SOLN
10.0000 [IU] | Freq: Once | SUBCUTANEOUS | Status: AC
  Administered 2014-03-06: 10 [IU] via SUBCUTANEOUS

## 2014-03-06 MED ORDER — DEXTROSE 50 % IV SOLN
2.0000 | Freq: Once | INTRAVENOUS | Status: AC
  Administered 2014-03-06: 100 mL via INTRAVENOUS

## 2014-03-06 MED ORDER — DEXTROSE 50 % IV SOLN
INTRAVENOUS | Status: AC
  Filled 2014-03-06: qty 50

## 2014-03-06 MED ORDER — FENTANYL CITRATE 0.05 MG/ML IJ SOLN
50.0000 ug | INTRAMUSCULAR | Status: AC | PRN
Start: 2014-03-06 — End: 2014-03-06
  Administered 2014-03-06 (×3): 50 ug via INTRAVENOUS
  Filled 2014-03-06 (×2): qty 2

## 2014-03-06 MED ORDER — SODIUM BICARBONATE 8.4 % IV SOLN
INTRAVENOUS | Status: DC
  Administered 2014-03-06: 09:00:00 via INTRAVENOUS
  Filled 2014-03-06 (×3): qty 150

## 2014-03-06 MED ORDER — FENTANYL CITRATE 0.05 MG/ML IJ SOLN
50.0000 ug | INTRAMUSCULAR | Status: DC | PRN
  Administered 2014-03-06 – 2014-03-09 (×7): 50 ug via INTRAVENOUS
  Filled 2014-03-06 (×8): qty 2

## 2014-03-06 MED ORDER — CALCIUM CHLORIDE 10 % IV SOLN
1.0000 g | Freq: Once | INTRAVENOUS | Status: AC
  Administered 2014-03-06: 1 g via INTRAVENOUS

## 2014-03-06 MED ORDER — PRISMASOL BGK 0/2.5 32-2.5 MEQ/L IV SOLN
INTRAVENOUS | Status: DC
  Administered 2014-03-06 (×4): via INTRAVENOUS_CENTRAL
  Filled 2014-03-06 (×9): qty 5000

## 2014-03-06 MED ORDER — SODIUM BICARBONATE 8.4 % IV SOLN
INTRAVENOUS | Status: AC
  Administered 2014-03-06: 100 meq via INTRAVENOUS
  Filled 2014-03-06: qty 100

## 2014-03-06 MED ORDER — DEXTROSE 5 % IV SOLN
1.0000 g | Freq: Once | INTRAVENOUS | Status: AC
  Administered 2014-03-06: 1 g via INTRAVENOUS
  Filled 2014-03-06: qty 1

## 2014-03-06 MED ORDER — CALCIUM CHLORIDE 10 % IV SOLN
INTRAVENOUS | Status: DC | PRN
  Administered 2014-03-06: 2 g via INTRAVENOUS

## 2014-03-06 MED ORDER — NOREPINEPHRINE BITARTRATE 1 MG/ML IV SOLN
5.0000 ug/min | INTRAVENOUS | Status: DC
  Administered 2014-03-06 (×2): 20 ug/min via INTRAVENOUS
  Administered 2014-03-06: 8 ug/min via INTRAVENOUS
  Filled 2014-03-06 (×4): qty 4

## 2014-03-06 MED ORDER — HEPARIN (PORCINE) 2000 UNITS/L FOR CRRT
INTRAVENOUS_CENTRAL | Status: DC | PRN
  Administered 2014-03-06: 12:00:00 via INTRAVENOUS_CENTRAL
  Filled 2014-03-06 (×2): qty 1000

## 2014-03-06 MED ORDER — NOREPINEPHRINE BITARTRATE 1 MG/ML IV SOLN
0.0000 ug/min | Freq: Once | INTRAVENOUS | Status: AC
  Administered 2014-03-06: 4 ug/min via INTRAVENOUS

## 2014-03-06 MED ORDER — INSULIN ASPART 100 UNIT/ML ~~LOC~~ SOLN
0.0000 [IU] | SUBCUTANEOUS | Status: DC
  Administered 2014-03-07 – 2014-03-08 (×2): 2 [IU] via SUBCUTANEOUS
  Administered 2014-03-08: 3 [IU] via SUBCUTANEOUS
  Administered 2014-03-09: 5 [IU] via SUBCUTANEOUS
  Administered 2014-03-09: 3 [IU] via SUBCUTANEOUS
  Administered 2014-03-09 – 2014-03-10 (×3): 2 [IU] via SUBCUTANEOUS
  Administered 2014-03-10: 3 [IU] via SUBCUTANEOUS

## 2014-03-06 MED FILL — Medication: Qty: 1 | Status: AC

## 2014-03-06 NOTE — ED Notes (Signed)
Cancelled Code Stroke @ 0600

## 2014-03-06 NOTE — Progress Notes (Signed)
Vent changes made per CCM order. Will wait 15 minutes and obtain ABG.

## 2014-03-06 NOTE — ED Notes (Signed)
Code Stroke cancelled.  

## 2014-03-06 NOTE — Progress Notes (Signed)
Patients wound vac from home given to son Ruthann Cancer to take home

## 2014-03-06 NOTE — Progress Notes (Signed)
CRITICAL VALUE ALERT  Critical value received:  K 6.2   CO2 9  Date of notification:  03/06/2014  Time of notification:  4034  Critical value read back: yes  Nurse who received alert:  Audie Pinto  MD notified (1st page):  Elsworth Soho at bedsdie  Time of first page:    MD notified (2nd page):  Time of second page:  Responding MD:  Elsworth Soho at bedside  Time MD responded:  1100

## 2014-03-06 NOTE — Consult Note (Signed)
Gabriel Lynch [birth year]  [id number].   Primary Care MD: unknown Requesting MD: Dr. Kara Mead Chief Complaint/Reason for Consult: sacral decubitus ulcer and cholecystitis HPI: This is a 69 yo black male who is very critically ill.  He is intubated and unresponsive.  Therefore, most of the history is obtained from the various family members that are at the bedside.  The patient has a h/o UC for around 7 years.  He was seen by a GI doctor and placed on medications.  He has not followed up well with his GI doctor, per his son, but he has continued his medications.  In December, he got very sick with copious amounts of bloody stool.  He was admitted to his local hospital where he underwent a partial colectomy.  It sounds like he had a transverse and left colectomy with a right sided colostomy.  The son states, "they removed 2/3 of his colon and gave him a colostomy with hopes to reverse it at [facility] later."  However, his CT scan here reports this is a right-sided ileostomy and he is s/p subtotal colectomy (which would be more appropriate for UC).  This surgery was on 01/22/14.  4 days later he went back to the OR for debridement of his sacral decubitus ulcer.  On 01-30-14, he was transferred from his outside hospital to [place] for rehab.  He sounds like he was initially improving at rehab, but the sister states she thinks he has been a little confused for at least the last week.  His urine has been dark and smelling.  He was finally started on Cipro yesterday for a UTI.  He saw the wound clinic about a week ago and he had a VAC put on his sacral wound.  The sister states that evan at this time, his wound smelt and was "dark" looking.    The patient essentially became more disoriented today and then unresponsive.  He was brought to [facility] where he is intubated with a core temp of 87 degrees, septic shock, severely acidotic, etc.  He has essentially been pan scanned.  His CT of the abdomen reveals acute  cholecystitis.  The patient is no longer making urine and is now on CRRT.  He has elevated T-waves from severe hyperkalemia.  He also has a Stage IV sacral decubitus ulcer.  We have been asked to evaluate the patient for further recommendations.  ROS : unable to obtain due to unresponsive nature and intubation  History reviewed. No pertinent family history.  Past Medical History  Diagnosis Date  . Ulcerative colitis   . Diabetes mellitus without complication   . Hypertension   . Iron deficiency anemia   . Hypothyroidism     Past Surgical History  Procedure Laterality Date  . Back surgery      multiple surgeries per family  . 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 Appendectomy    . Debridement of sacral decubitus ulcer  01-26-14    Social History:  reports that he does not drink alcohol or use illicit drugs. His tobacco history is not on file.  Allergies:  Allergies  Allergen Reactions  . Influenza Vaccines Other (See Comments)    ON MAR    Medications Prior to Admission  Medication Sig Dispense Refill  . acetaminophen (TYLENOL) 325 MG tablet Take 650 mg by mouth every 4 (four) hours as needed for mild pain, fever or headache.    . Amino Acids-Protein Hydrolys (FEEDING SUPPLEMENT,  PRO-STAT SUGAR FREE 64,) LIQD Take 30 mLs by mouth 2 (two) times daily.    Marland Kitchen amLODipine (NORVASC) 5 MG tablet Take 5 mg by mouth daily.    . feeding supplement, GLUCERNA SHAKE, (GLUCERNA SHAKE) LIQD Take 237 mLs by mouth 2 (two) times daily between meals.    . ferrous gluconate (FERGON) 324 MG tablet Take 324 mg by mouth 3 (three) times daily.    . furosemide (LASIX) 20 MG tablet Take 20 mg by mouth daily.    Marland Kitchen glipiZIDE (GLUCOTROL) 10 MG tablet Take 10 mg by mouth daily before breakfast.    . levothyroxine (SYNTHROID, LEVOTHROID) 100 MCG tablet Take 100 mcg by mouth daily before breakfast.    . metFORMIN (GLUCOPHAGE) 1000 MG tablet Take 1,000 mg by mouth 2 (two) times daily with a meal.     . metoprolol succinate (TOPROL-XL) 100 MG 24 hr tablet Take 100 mg by mouth daily. Take with or immediately following a meal.    . Multiple Vitamin (MULTIVITAMIN WITH MINERALS) TABS tablet Take 1 tablet by mouth daily.    . Oxycodone HCl 10 MG TABS Take 1 mg by mouth every 4 (four) hours.    . potassium chloride (K-DUR,KLOR-CON) 10 MEQ tablet Take 10 mEq by mouth daily.    . tamsulosin (FLOMAX) 0.4 MG CAPS capsule Take 0.4 mg by mouth daily after breakfast.      Blood pressure 118/45, pulse 72, temperature 90.1 F (32.3 C), temperature source Oral, resp. rate 17, weight 133 lb 2.5 oz (60.4 kg), SpO2 100 %. Physical Exam: General: malnourished, very ill-appearing black male who is unresponsive HEENT: head is normocephalic, atraumatic.  Ears and nose without any masses or lesions.  Mouth is pink.  ETT in place Heart: regular, rate, and rhythm, but brady down in the 30s earlier in the day.  Normal s1,s2. No obvious murmurs, gallops, or rubs noted.  Palpable radial and pedal pulses bilaterally Lungs: CTAB, no wheezes, rhonchi, or rales noted.  Respiratory effort nonlabored Abd: soft, NT, ND, absent BS, no masses, hernias, or organomegaly, right sided ileostomy in place with no output, bag just changed though.  Stoma is pink and viable MS: all 4 extremities are symmetrical with no cyanosis, clubbing, or edema.  He does have some wounds on his left leg.  Dressings were not removed by myself Skin: cool and dry with no masses, lesions, or rashes, stage IV sacral decubitus ulcer that is about 9x8cm.  His sacrum is exposed.  This wound is 100% necrotic.  The muscle is necrotic, the tissue at the base and around his bone is all necrotic.  It is malodorous.   Psych: unresponsive, unsedated on the vent    Results for orders placed or performed during the hospital encounter of 03/06/14 (from the past 48 hour(s))  Ethanol     Status: None   Collection Time: 03/06/14  5:54 AM  Result Value Ref Range    Alcohol, Ethyl (B) <5 0 - 9 mg/dL    Comment:        LOWEST DETECTABLE LIMIT FOR SERUM ALCOHOL IS 11 mg/dL FOR MEDICAL PURPOSES ONLY   I-Stat Troponin, ED (not at [facility])     Status: None   Collection Time: 03/06/14  5:54 AM  Result Value Ref Range   Troponin i, poc 0.03 0.00 - 0.08 ng/mL   Comment 3            Comment: Due to the release kinetics of cTnI, a negative result  within the first hours of the onset of symptoms does not rule out myocardial infarction with certainty. If myocardial infarction is still suspected, repeat the test at appropriate intervals.   Protime-INR     Status: Abnormal   Collection Time: 03/06/14  5:54 AM  Result Value Ref Range   Prothrombin Time 18.6 (H) 11.6 - 15.2 seconds   INR 1.54 (H) 0.00 - 1.49  APTT     Status: Abnormal   Collection Time: 03/06/14  5:54 AM  Result Value Ref Range   aPTT 42 (H) 24 - 37 seconds    Comment:        IF BASELINE aPTT IS ELEVATED, SUGGEST PATIENT RISK ASSESSMENT BE USED TO DETERMINE APPROPRIATE ANTICOAGULANT THERAPY.   CBC     Status: Abnormal   Collection Time: 03/06/14  5:54 AM  Result Value Ref Range   WBC 52.6 (HH) 4.0 - 10.5 K/uL    Comment: WHITE COUNT CONFIRMED ON SMEAR CRITICAL RESULT CALLED TO, READ BACK BY AND VERIFIED WITH: JAMES CAMP,RN AT 3790 03/06/14 BY ZBEECH.    RBC 3.23 (L) 4.22 - 5.81 MIL/uL   Hemoglobin 8.8 (L) 13.0 - 17.0 g/dL   HCT 30.8 (L) 39.0 - 52.0 %   MCV 95.4 78.0 - 100.0 fL   MCH 27.2 26.0 - 34.0 pg   MCHC 28.6 (L) 30.0 - 36.0 g/dL   RDW 17.4 (H) 11.5 - 15.5 %   Platelets 1036 (HH) 150 - 400 K/uL    Comment: PLATELET COUNT CONFIRMED BY SMEAR CRITICAL RESULT CALLED TO, READ BACK BY AND VERIFIED WITH: JAMES CAMP,RN AT 2409 03/06/14 BY ZBEECH.   Differential     Status: Abnormal   Collection Time: 03/06/14  5:54 AM  Result Value Ref Range   Neutrophils Relative % 88 (H) 43 - 77 %   Lymphocytes Relative 4 (L) 12 - 46 %   Monocytes Relative 8 3 - 12 %   Eosinophils Relative 0 0  - 5 %   Basophils Relative 0 0 - 1 %   Neutro Abs 46.3 (H) 1.7 - 7.7 K/uL   Lymphs Abs 2.1 0.7 - 4.0 K/uL   Monocytes Absolute 4.2 (H) 0.1 - 1.0 K/uL   Eosinophils Absolute 0.0 0.0 - 0.7 K/uL   Basophils Absolute 0.0 0.0 - 0.1 K/uL   RBC Morphology POLYCHROMASIA PRESENT    WBC Morphology MILD LEFT SHIFT (1-5% METAS, OCC MYELO, OCC BANDS)   Comprehensive metabolic panel     Status: Abnormal   Collection Time: 03/06/14  5:54 AM  Result Value Ref Range   Sodium 128 (L) 135 - 145 mmol/L   Potassium >7.5 (HH) 3.5 - 5.1 mmol/L    Comment: NO VISIBLE HEMOLYSIS REPEATED TO VERIFY CRITICAL RESULT CALLED TO, READ BACK BY AND VERIFIED WITH: B.MICHAELSON,RN 03/06/14 0712 BY BSLADE    Chloride 96 96 - 112 mmol/L   CO2 7 (LL) 19 - 32 mmol/L    Comment: REPEATED TO VERIFY CRITICAL RESULT CALLED TO, READ BACK BY AND VERIFIED WITH: B.MICHAELSON,RN 03/06/14 0712 BY BSLADE    Glucose, Bld 185 (H) 70 - 99 mg/dL   BUN 118 (H) 6 - 23 mg/dL   Creatinine, Ser 8.59 (H) 0.50 - 1.35 mg/dL   Calcium 10.7 (H) 8.4 - 10.5 mg/dL   Total Protein 7.6 6.0 - 8.3 g/dL   Albumin 2.6 (L) 3.5 - 5.2 g/dL   AST 27 0 - 37 U/L   ALT 20 0 - 53 U/L  Alkaline Phosphatase 296 (H) 39 - 117 U/L   Total Bilirubin 0.3 0.3 - 1.2 mg/dL   GFR calc non Af Amer 6 (L) >90 mL/min   GFR calc Af Amer 7 (L) >90 mL/min    Comment: (NOTE) The eGFR has been calculated using the CKD EPI equation. This calculation has not been validated in all clinical situations. eGFR's persistently <90 mL/min signify possible Chronic Kidney Disease.    Anion gap 25 (H) 5 - 15  Type and screen     Status: None   Collection Time: 03/06/14  5:54 AM  Result Value Ref Range   ABO/RH(D) A POS    Antibody Screen NEG    Sample Expiration 03/09/2014   ABO/Rh     Status: None   Collection Time: 03/06/14  5:54 AM  Result Value Ref Range   ABO/RH(D) A POS   I-Stat CG4 Lactic Acid, ED     Status: Abnormal   Collection Time: 03/06/14  5:55 AM  Result Value  Ref Range   Lactic Acid, Venous 12.30 (HH) 0.5 - 2.0 mmol/L   Comment NOTIFIED PHYSICIAN   CBG monitoring, ED     Status: Abnormal   Collection Time: 03/06/14  6:47 AM  Result Value Ref Range   Glucose-Capillary 254 (H) 70 - 99 mg/dL  I-Stat arterial blood gas, ED     Status: Abnormal   Collection Time: 03/06/14  6:49 AM  Result Value Ref Range   pH, Arterial 6.785 (LL) 7.350 - 7.450   pCO2 arterial 33.4 (L) 35.0 - 45.0 mmHg   pO2, Arterial 540.0 (H) 80.0 - 100.0 mmHg   Bicarbonate 5.0 (L) 20.0 - 24.0 mEq/L   TCO2 6 0 - 100 mmol/L   O2 Saturation 100.0 %   Acid-base deficit 28.0 (H) 0.0 - 2.0 mmol/L   Sample type ARTERIAL    Comment NOTIFIED PHYSICIAN   TSH     Status: None   Collection Time: 03/06/14  7:21 AM  Result Value Ref Range   TSH 1.174 0.350 - 4.500 uIU/mL  CBC     Status: Abnormal   Collection Time: 03/06/14  7:38 AM  Result Value Ref Range   WBC 49.0 (H) 4.0 - 10.5 K/uL    Comment: REPEATED TO VERIFY   RBC 2.52 (L) 4.22 - 5.81 MIL/uL   Hemoglobin 7.0 (L) 13.0 - 17.0 g/dL    Comment: REPEATED TO VERIFY   HCT 23.1 (L) 39.0 - 52.0 %   MCV 91.7 78.0 - 100.0 fL   MCH 27.8 26.0 - 34.0 pg   MCHC 30.3 30.0 - 36.0 g/dL   RDW 17.0 (H) 11.5 - 15.5 %   Platelets 699 (H) 150 - 400 K/uL    Comment: SPECIMEN CHECKED FOR CLOTS REPEATED TO VERIFY   Basic metabolic panel     Status: Abnormal   Collection Time: 03/06/14  7:38 AM  Result Value Ref Range   Sodium 133 (L) 135 - 145 mmol/L   Potassium 6.2 (HH) 3.5 - 5.1 mmol/L    Comment: REPEATED TO VERIFY CRITICAL RESULT CALLED TO, READ BACK BY AND VERIFIED WITH: S.HARDY,RN 03/06/14 1040 BY BSLADE    Chloride 105 96 - 112 mmol/L   CO2 9 (LL) 19 - 32 mmol/L    Comment: REPEATED TO VERIFY CRITICAL RESULT CALLED TO, READ BACK BY AND VERIFIED WITH: S.HARDY,RN 03/06/14 1040 BY BSLADE    Glucose, Bld 439 (H) 70 - 99 mg/dL   BUN 105 (H) 6 - 23  mg/dL   Creatinine, Ser 6.75 (H) 0.50 - 1.35 mg/dL   Calcium 10.8 (H) 8.4 - 10.5  mg/dL   GFR calc non Af Amer 8 (L) >90 mL/min   GFR calc Af Amer 9 (L) >90 mL/min    Comment: (NOTE) The eGFR has been calculated using the CKD EPI equation. This calculation has not been validated in all clinical situations. eGFR's persistently <90 mL/min signify possible Chronic Kidney Disease.    Anion gap 19 (H) 5 - 15  Troponin I     Status: None   Collection Time: 03/06/14  7:38 AM  Result Value Ref Range   Troponin I <0.03 <0.031 ng/mL    Comment:        NO INDICATION OF MYOCARDIAL INJURY.   Lactic acid, plasma     Status: Abnormal   Collection Time: 03/06/14  7:39 AM  Result Value Ref Range   Lactic Acid, Venous 8.6 (HH) 0.5 - 2.0 mmol/L    Comment: REPEATED TO VERIFY CRITICAL RESULT CALLED TO, READ BACK BY AND VERIFIED WITH: S.HARDY,RN 03/06/14 1122 BY BSLADE RESULTS CONFIRMED BY MANUAL DILUTION   I-Stat CG4 Lactic Acid, ED     Status: Abnormal   Collection Time: 03/06/14  7:44 AM  Result Value Ref Range   Lactic Acid, Venous 10.70 (HH) 0.5 - 2.0 mmol/L   Comment NOTIFIED PHYSICIAN   Procalcitonin     Status: None   Collection Time: 03/06/14  8:00 AM  Result Value Ref Range   Procalcitonin 8.31 ng/mL    Comment:        Interpretation: PCT > 2 ng/mL: Systemic infection (sepsis) is likely, unless other causes are known. (NOTE)         ICU PCT Algorithm               Non ICU PCT Algorithm    ----------------------------     ------------------------------         PCT < 0.25 ng/mL                 PCT < 0.1 ng/mL     Stopping of antibiotics            Stopping of antibiotics       strongly encouraged.               strongly encouraged.    ----------------------------     ------------------------------       PCT level decrease by               PCT < 0.25 ng/mL       >= 80% from peak PCT       OR PCT 0.25 - 0.5 ng/mL          Stopping of antibiotics                                             encouraged.     Stopping of antibiotics           encouraged.     ----------------------------     ------------------------------       PCT level decrease by              PCT >= 0.25 ng/mL       < 80% from peak PCT        AND PCT >= 0.5 ng/mL  Continuing antibiotics                                               encouraged.       Continuing antibiotics            encouraged.    ----------------------------     ------------------------------     PCT level increase compared          PCT > 0.5 ng/mL         with peak PCT AND          PCT >= 0.5 ng/mL             Escalation of antibiotics                                          strongly encouraged.      Escalation of antibiotics        strongly encouraged.   I-Stat arterial blood gas, ED     Status: Abnormal   Collection Time: 03/06/14  8:11 AM  Result Value Ref Range   pH, Arterial 6.870 (LL) 7.350 - 7.450   pCO2 arterial 21.9 (L) 35.0 - 45.0 mmHg   pO2, Arterial 195.0 (H) 80.0 - 100.0 mmHg   Bicarbonate 4.5 (L) 20.0 - 24.0 mEq/L   TCO2 5 0 - 100 mmol/L   O2 Saturation 99.0 %   Acid-base deficit 28.0 (H) 0.0 - 2.0 mmol/L   Patient temperature 30.5 C    Collection site RADIAL, ALLEN'S TEST ACCEPTABLE    Drawn by RT    Sample type ARTERIAL    Comment NOTIFIED PHYSICIAN   CBG monitoring, ED     Status: Abnormal   Collection Time: 03/06/14  8:12 AM  Result Value Ref Range   Glucose-Capillary 358 (H) 70 - 99 mg/dL  MRSA PCR Screening     Status: Abnormal   Collection Time: 03/06/14  9:41 AM  Result Value Ref Range   MRSA by PCR POSITIVE (A) NEGATIVE    Comment:        The GeneXpert MRSA Assay (FDA approved for NASAL specimens only), is one component of a comprehensive MRSA colonization surveillance program. It is not intended to diagnose MRSA infection nor to guide or monitor treatment for MRSA infections. RESULT CALLED TO, READ BACK BY AND VERIFIED WITH: S. HARDY RN 11:40 03/06/14 ([email or web address])   Urine Drug Screen     Status: None   Collection Time: 03/06/14 11:00 AM  Result  Value Ref Range   Opiates NONE DETECTED NONE DETECTED   Cocaine NONE DETECTED NONE DETECTED   Benzodiazepines NONE DETECTED NONE DETECTED   Amphetamines NONE DETECTED NONE DETECTED   Tetrahydrocannabinol NONE DETECTED NONE DETECTED   Barbiturates NONE DETECTED NONE DETECTED    Comment:        DRUG SCREEN FOR MEDICAL PURPOSES ONLY.  IF CONFIRMATION IS NEEDED FOR ANY PURPOSE, NOTIFY LAB WITHIN 5 DAYS.        LOWEST DETECTABLE LIMITS FOR URINE DRUG SCREEN Drug Class       Cutoff (ng/mL) Amphetamine      1000 Barbiturate      200 Benzodiazepine   779 Tricyclics       390 Opiates  300 Cocaine          300 THC              50   Urinalysis, Routine w reflex microscopic     Status: Abnormal   Collection Time: 03/06/14 11:00 AM  Result Value Ref Range   Color, Urine YELLOW YELLOW    Comment: URINALYSIS PERFORMED ON SUPERNATANT LESS THAN 10 mL OF URINE SUBMITTED URINE IS GROSSLY BLOODY    APPearance CLOUDY (A) CLEAR   Specific Gravity, Urine 1.036 (H) 1.005 - 1.030   pH 7.0 5.0 - 8.0   Glucose, UA 250 (A) NEGATIVE mg/dL   Hgb urine dipstick LARGE (A) NEGATIVE   Bilirubin Urine NEGATIVE NEGATIVE   Ketones, ur 15 (A) NEGATIVE mg/dL   Protein, ur >300 (A) NEGATIVE mg/dL   Urobilinogen, UA 0.2 0.0 - 1.0 mg/dL   Nitrite NEGATIVE NEGATIVE   Leukocytes, UA SMALL (A) NEGATIVE  Urine microscopic-add on     Status: Abnormal   Collection Time: 03/06/14 11:00 AM  Result Value Ref Range   Squamous Epithelial / LPF RARE RARE   WBC, UA TOO NUMEROUS TO COUNT <3 WBC/hpf   RBC / HPF TOO NUMEROUS TO COUNT <3 RBC/hpf   Bacteria, UA MANY (A) RARE  Glucose, capillary     Status: Abnormal   Collection Time: 03/06/14 11:16 AM  Result Value Ref Range   Glucose-Capillary 303 (H) 70 - 99 mg/dL   Ct Abdomen Pelvis Wo Contrast  03/06/2014   CLINICAL DATA:  Patient unresponsive and intubated. Concern for sepsis with low body temperature. History of ulcerative colitis.  EXAM: CT CHEST, ABDOMEN  AND PELVIS WITHOUT CONTRAST  TECHNIQUE: Multidetector CT imaging of the chest, abdomen and pelvis was performed following the standard protocol without IV contrast.  COMPARISON:  Chest x-ray ant [phone] hr  FINDINGS: CT CHEST FINDINGS  Endotracheal tube extends into the mid trachea. A nasogastric tube extends into the stomach. There is no evidence of pneumothorax with lungs demonstrating mild COPD and bleb formation in the peripheral lungs bilaterally. Lower lung zones demonstrate atelectasis and probable component of bilateral lower lobe mucous plugging in the posterior lower lobe bronchi. No pleural fluid is identified.  The heart size is normal. No pericardial effusion. The thoracic aorta shows atherosclerotic plaque without aneurysmal disease. A tiny amount of calcified plaque is suspected in the distribution of the LAD and distal left circumflex coronary arteries. The thyroid gland is unremarkable. No evidence of pneumomediastinum or mediastinal mass. No lymphadenopathy identified. Bony structures are unremarkable.  CT ABDOMEN AND PELVIS FINDINGS  Acute cholecystitis is suspected with significant amount of fluid surrounding the gallbladder. Although the gallbladder is not overtly distended, there may be subtle underlying gallstones. Some free fluid tracks around the medial edge of the liver and extends up to the liver dome. There are inflammatory changes in the region of the gastrohepatic ligament and also in the left upper quadrant immediately inferior to the stomach. This appearance is nonspecific. Recommend correlation with laboratory evidence of pancreatitis. The pancreas itself appears fairly unremarkable by unenhanced CT.  There is evidence of an ileostomy in the right lower abdomen. The patient appears to be status post prior subtotal colectomy with only a rectum remaining. There is no evidence of bowel perforation or focal abscess. No free intraperitoneal air identified.  Small bowel loops are of normal  caliber. No masses or lymphadenopathy identified in the abdomen or pelvis. No hernias are seen. The bladder contains a Foley catheter  with some increased density seen surrounding the Foley catheter balloon. This may be consistent with some hole line in the bladder. Correlation suggested with urinary tract. Underlying bladder lesion cannot be excluded by unenhanced CT.  IMPRESSION: 1. Chest demonstrates mucous plug formation and atelectasis in both posterior lower lobes. There is underlying COPD. No pneumothorax is identified. 2. Mild coronary atherosclerotic disease suspected with calcified plaque in the distal LAD and left circumflex territory. 3. Evidence of probable acute cholecystitis with free fluid surrounding the gallbladder. Cholelithiasis is suspected. Ultrasound correlation may be helpful. Some free fluid tracks around the liver. 4. Nonspecific inflammatory changes in the region of the gastrohepatic ligament and inferior to the stomach. Some of this may be postoperative in nature given evidence of prior subtotal colectomy. Recommend correlation with any laboratory evidence of pancreatitis. 5. No evidence of bowel perforation or obstruction. Status post subtotal colectomy. Right-sided ileostomy site appears uncomplicated. 6. High density in the bladder adjacent to the Foley catheter balloon. This may be reflective of blood given that IV contrast was not administered. Underlying bladder lesion cannot be excluded.   Electronically Signed   By: Aletta Edouard M.D.   On: 03/06/2014 09:22   Ct Head Wo Contrast  03/06/2014   ADDENDUM REPORT: 03/06/2014 09:18  ADDENDUM: On axial slice 56 series 902, there is a 1.6 x 1.3 cm mass in the superficial soft tissues at the level of the angle of the mandible on the left. Suspect sebaceous cyst, although a focal lymph node could present in this manner.   Electronically Signed   By: Lowella Grip III M.D.   On: 03/06/2014 09:18   03/06/2014   CLINICAL DATA:   Patient unresponsive  EXAM: CT HEAD WITHOUT CONTRAST  CT CERVICAL SPINE WITHOUT CONTRAST  TECHNIQUE: Multidetector CT imaging of the head and cervical spine was performed following the standard protocol without intravenous contrast. Multiplanar CT image reconstructions of the cervical spine were also generated.  COMPARISON:  None.  FINDINGS: CT HEAD FINDINGS  The ventricles are normal in size and configuration. There is no appreciable intracranial mass, hemorrhage, extra-axial fluid collection, or midline shift. There is a probable prominent prevascular space just lateral to the inferior aspect of the posterior limb of the left internal capsule. Gray-white compartments appear normal. No acute infarct apparent. Bony calvarium appears intact. The mastoid air cells are clear. The patient has had an antrostomy on the left. There is mucosal thickening in the inferior left maxillary antrum. There is a well-circumscribed rounded bony defect in the upper anterior hard palate. 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 There is evidence of previous surgery involving the anterior aspect of the right maxillary antrum wall.  CT CERVICAL SPINE FINDINGS  There is no fracture or spondylolisthesis. Prevertebral soft tissues and predental space regions are normal. Patient is intubated.  There is moderate disc space narrowing at C4-5, C5-6, and C6-7. There is mild narrowing at C3-4. There are central disc protrusions causing impression on the ventral cord at C3-4, C4-5, and C5-6. A smaller central disc protrusion is noted at C2-3. There is a degree of spinal stenosis due to the central disc protrusions at C3-4, C4-5, and C5-6. No erosive change or bony destruction.  IMPRESSION: CT head: No intracranial mass, hemorrhage, or extra-axial fluid collection. No focal gray-white compartment lesions/acute appearing infarct. Postoperative change in the face. Prosthetic right globe. Well-circumscribed bony defect anterior hard palate. Etiology  for this defect is uncertain. It should be amenable to visual inspection.  CT cervical spine: Multifocal osteoarthritic change. There is a degree of spinal stenosis due to central disc protrusion at C3-4, C4-5, and C5-6. No fracture or spondylolisthesis. No erosive change or bony destruction.  Electronically Signed: By: Lowella Grip III M.D. On: 03/06/2014 09:14   Ct Chest Wo Contrast  03/06/2014   CLINICAL DATA:  Patient unresponsive and intubated. Concern for sepsis with low body temperature. History of ulcerative colitis.  EXAM: CT CHEST, ABDOMEN AND PELVIS WITHOUT CONTRAST  TECHNIQUE: Multidetector CT imaging of the chest, abdomen and pelvis was performed following the standard protocol without IV contrast.  COMPARISON:  Chest x-ray ant [phone] [id number]  FINDINGS: CT CHEST FINDINGS  Endotracheal tube extends into the mid trachea. A nasogastric tube extends into the stomach. There is no evidence of pneumothorax with lungs demonstrating mild COPD and bleb formation in the peripheral lungs bilaterally. Lower lung zones demonstrate atelectasis and probable component of bilateral lower lobe mucous plugging in the posterior lower lobe bronchi. No pleural fluid is identified.  The heart size is normal. No pericardial effusion. The thoracic aorta shows atherosclerotic plaque without aneurysmal disease. A tiny amount of calcified plaque is suspected in the distribution of the LAD and distal left circumflex coronary arteries. The thyroid gland is unremarkable. No evidence of pneumomediastinum or mediastinal mass. No lymphadenopathy identified. Bony structures are unremarkable.  CT ABDOMEN AND PELVIS FINDINGS  Acute cholecystitis is suspected with significant amount of fluid surrounding the gallbladder. Although the gallbladder is not overtly distended, there may be subtle underlying gallstones. Some free fluid tracks around the medial edge of the liver and extends up to the liver dome. There are inflammatory changes in the  region of the gastrohepatic ligament and also in the left upper quadrant immediately inferior to the stomach. This appearance is nonspecific. Recommend correlation with laboratory evidence of pancreatitis. The pancreas itself appears fairly unremarkable by unenhanced CT.  There is evidence of an ileostomy in the right lower abdomen. The patient appears to be status post prior subtotal colectomy with only a rectum remaining. There is no evidence of bowel perforation or focal abscess. No free intraperitoneal air identified.  Small bowel loops are of normal caliber. No masses or lymphadenopathy identified in the abdomen or pelvis. No hernias are seen. The bladder contains a Foley catheter with some increased density seen surrounding the Foley catheter balloon. This may be consistent with some hole line in the bladder. Correlation suggested with urinary tract. Underlying bladder lesion cannot be excluded by unenhanced CT.  IMPRESSION: 1. Chest demonstrates mucous plug formation and atelectasis in both posterior lower lobes. There is underlying COPD. No pneumothorax is identified. 2. Mild coronary atherosclerotic disease suspected with calcified plaque in the distal LAD and left circumflex territory. 3. Evidence of probable acute cholecystitis with free fluid surrounding the gallbladder. Cholelithiasis is suspected. Ultrasound correlation may be helpful. Some free fluid tracks around the liver. 4. Nonspecific inflammatory changes in the region of the gastrohepatic ligament and inferior to the stomach. Some of this may be postoperative in nature given evidence of prior subtotal colectomy. Recommend correlation with any laboratory evidence of pancreatitis. 5. No evidence of bowel perforation or obstruction. Status post subtotal colectomy. Right-sided ileostomy site appears uncomplicated. 6. High density in the bladder adjacent to the Foley catheter balloon. This may be reflective of blood given that IV contrast was not  administered. Underlying bladder lesion cannot be excluded.   Electronically Signed   By: Aletta Edouard M.D.   On: 03/06/2014 09:22  Ct Cervical Spine Wo Contrast  03/06/2014   ADDENDUM REPORT: 03/06/2014 09:18  ADDENDUM: On axial slice 56 series 373, there is a 1.6 x 1.3 cm mass in the superficial soft tissues at the level of the angle of the mandible on the left. Suspect sebaceous cyst, although a focal lymph node could present in this manner.   Electronically Signed   By: Lowella Grip III M.D.   On: 03/06/2014 09:18   03/06/2014   CLINICAL DATA:  Patient unresponsive  EXAM: CT HEAD WITHOUT CONTRAST  CT CERVICAL SPINE WITHOUT CONTRAST  TECHNIQUE: Multidetector CT imaging of the head and cervical spine was performed following the standard protocol without intravenous contrast. Multiplanar CT image reconstructions of the cervical spine were also generated.  COMPARISON:  None.  FINDINGS: CT HEAD FINDINGS  The ventricles are normal in size and configuration. There is no appreciable intracranial mass, hemorrhage, extra-axial fluid collection, or midline shift. There is a probable prominent prevascular space just lateral to the inferior aspect of the posterior limb of the left internal capsule. Gray-white compartments appear normal. No acute infarct apparent. Bony calvarium appears intact. The mastoid air cells are clear. The patient has had an antrostomy on the left. There is mucosal thickening in the inferior left maxillary antrum. There is a well-circumscribed rounded bony defect in the upper anterior hard palate. 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 There is evidence of previous surgery involving the anterior aspect of the right maxillary antrum wall.  CT CERVICAL SPINE FINDINGS  There is no fracture or spondylolisthesis. Prevertebral soft tissues and predental space regions are normal. Patient is intubated.  There is moderate disc space narrowing at C4-5, C5-6, and C6-7. There is mild narrowing at  C3-4. There are central disc protrusions causing impression on the ventral cord at C3-4, C4-5, and C5-6. A smaller central disc protrusion is noted at C2-3. There is a degree of spinal stenosis due to the central disc protrusions at C3-4, C4-5, and C5-6. No erosive change or bony destruction.  IMPRESSION: CT head: No intracranial mass, hemorrhage, or extra-axial fluid collection. No focal gray-white compartment lesions/acute appearing infarct. Postoperative change in the face. Prosthetic right globe. Well-circumscribed bony defect anterior hard palate. Etiology for this defect is uncertain. It should be amenable to visual inspection.  CT cervical spine: Multifocal osteoarthritic change. There is a degree of spinal stenosis due to central disc protrusion at C3-4, C4-5, and C5-6. No fracture or spondylolisthesis. No erosive change or bony destruction.  Electronically Signed: By: Lowella Grip III M.D. On: 03/06/2014 09:14   Dg Chest Port 1 View  03/06/2014   CLINICAL DATA:  Central catheter placement  EXAM: PORTABLE CHEST - 1 VIEW  COMPARISON:  Chest radiograph and chest CT obtained earlier in the day  FINDINGS: Central catheter tip is in the superior vena cava. Endotracheal tube tip is 4.7 cm above the carina. Nasogastric tube tip and side port are in the stomach. No pneumothorax. There is patchy bibasilar atelectatic change. Elsewhere lungs are clear. Heart size and pulmonary vascularity are normal. No adenopathy.  IMPRESSION: Tube and catheter positions as described without pneumothorax. Patchy bibasilar atelectatic change. Lungs elsewhere clear. No change in cardiac silhouette.   Electronically Signed   By: Lowella Grip III M.D.   On: 03/06/2014 10:01   Dg Chest Portable 1 View  03/06/2014   CLINICAL DATA:  ET and OG tube placement.  EXAM: PORTABLE CHEST - 1 VIEW  COMPARISON:  None.  FINDINGS: Endotracheal  tube is at the level of the clavicular heads. Orogastric tube extends into the stomach. The  lungs are expanded. There is a deep costophrenic angle on the right, suggesting a small pneumothorax but this is not confirmed by identification of the pleural line. No large pneumothorax is evident. No effusion is evident.  IMPRESSION: ET and OG tube as described.  Questionable small right pneumothorax.   Electronically Signed   By: Andreas Newport M.D.   On: 03/06/2014 06:44       Assessment/Plan 1. Septic shock, likely multifactorial 2. Acute cholecystitis 3. ? UTI (per sister from results at rehab) 4. 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 ARF, on CRRT, no urine output 6. Hyperkalemia 7. H/o UC, recent surgery 5 weeks ago 8. H/o HTN 9. DM 10. Hypothermia  11. POD 43, s/p subtotal colectomy with ileostomy 12. Lower extremity wound from spider bite  Plan: 1. This patient is critically ill with hypothermia, temp 87.  He likely has multiple sources of infection.  He was just recently diagnosed with a UTI at the nursing facility.  He has acute cholecystitis as well as necrotic sacral wound with presumed osteomyelitis secondary to exposed bone.  All of these things are likely contributing his his overall poor state.  IR has been consulted to see if they can place a percutaneous cholecystostomy drain.  Agree with broad spectrum abx therapy given the multiple possible sources.  His is severely acidotic and his K is finally starting to come down.  When he is stable enough, per CCM, he can have a perc chole drain placed, unless IR felt this could be done at the bedside.  I discussed this with the family.  I also explained to them that this may not make him 100% better because of the possibility of multiple sources.  They understand that.   2. At some point, the patient may benefit from debridement of his sacral wound.   However, he is so critically ill at this point, that will have to be addressed later if he survives.  It is impossible to tell if just the  visible tissue is necrotic or if he may have more necrosis that tracks further.  For now, just WD dressing changes to this wound.  A VAC should NOT be replaced back to this wound! 3. Thank you for this consultation.  We will follow along with you.  I think this patient has a very poor prognosis based off of how critically ill he is currently, but the family would like to continue resuscitative efforts at this time to see if he responds.  I think this is appropriate.    Sterling Ucci E 03/06/2014, 12:35 PM Pager: [phone]

## 2014-03-06 NOTE — Progress Notes (Signed)
Stopped by to see how things were going with the CRRT- already clotted times one- getting bicarb pre and post filter as well as peripherally- will stop peripheral bicarb drip.  ABG improved- K improved also.  will check labs this afternoon and see if any adjustments are needed in prescription.    Gabriel Lynch A

## 2014-03-06 NOTE — Progress Notes (Signed)
Code stroke called by EMS.   LKW was Wednesday morning, however presumably was seen Thursday by someone who administered medications.   On arrival, patient was hypotensive, bradycardic. He moved bilateral arms purposefully. R eye prosthetic, left eye with full eom and small but reactive pupil.    Markedly elevated lactate and potassium. Low SaO2 prior to being placed on NRB. At this time, the patient is not a tpa candidate nor IR candidate. He is currently being intubated. I feel that his AMS is likely due to his general medical condition.  If there remains concern for his neurological status following his initial assessment, please call for formal consult.   Roland Rack, MD [facility] [phone]  If 7pm- 7am, please page neurology on call as listed in [place].

## 2014-03-06 NOTE — ED Notes (Signed)
Pt. Is from [facility] was unresponsive and facility called EMS. On EMS arrival pt. Was hypotensive 76/52. HR was NSR and SB. CBG 183. Spo2 72% on RA, pt put on NRB Spo2 93%.

## 2014-03-06 NOTE — Progress Notes (Addendum)
CRITICAL VALUE ALERT  Critical value received:  Lactic Acid 8.6  Date of notification:  03/06/2014  Time of notification:  1122  Critical value read back: yes  Nurse who received alert:  Audie Pinto  MD notified (1st page):  Elsworth Soho at bedside  Time of first page:    MD notified (2nd page):  Time of second page:  Responding MD:  Elsworth Soho at bedside  Time MD responded:  1130

## 2014-03-06 NOTE — Progress Notes (Signed)
Hypoglycemic Event  CBG: cbg-44  Treatment: D50 IV 50 mL  Symptoms: None  Follow-up CBG: Time:2320 CBG Result:[id number]-107  Possible Reasons for Event: Medication regimen: pt was on insulin gtt  Comments/MD notified:per protocol    Dillard Essex  Remember to initiate Hypoglycemia Order Set & complete

## 2014-03-06 NOTE — Progress Notes (Addendum)
Clinical Social Work Department BRIEF PSYCHOSOCIAL ASSESSMENT 03/06/2014  Patient:  BAXTER, GONZALEZ     Account Number:  [id number]     Admit date:  03/06/2014  Clinical Social Worker:  Forest Gleason  Date/Time:  03/06/2014 07:50 AM  Referred by:  Physician  Date Referred:  03/06/2014 Referred for  Psychosocial assessment  SNF Placement   Other Referral:   Patient is a resident at [facility] type:  Family Other interview type:   Review of chart and chaplain    PSYCHOSOCIAL DATA Living Status:  FACILITY Admitted from facility:  [facility] Level of care:  [facility] Primary support name:  Son: Legrand Como, sister Kennyth Lose Primary support relationship to patient:  FAMILY Degree of support available:   Multiple family members in the ED with patient.  Has a family who is employed with [employer].  Strong support of patient as all present at time of patient arriving to ED. Aware of his health and care.    CURRENT CONCERNS Current Concerns  Post-Acute Placement   Other Concerns:   If patient will return to SNF?    SOCIAL WORK ASSESSMENT / PLAN LCSW was made aware by staff in ED that patient is from a SNF. Patient has resided at [facility] per family and was brought into ED after being unresponsive with ?stroke.    Patient is currently intubated and was unable to participate in assessment. Family gave all history and met with chaplian early this morning. Review chart and notes and will contact [facility] to alert day staff of patient in hospital and being admitted.  Patient originally from [place], had multiple medical problems to which he was moved to [place] to be closer to family. Place at [facility] in January 2016.   Family reported frustrations of facility not providing quick efficent care for patient and wonder if this could have been avoided. LCSW will follow up with facility. patient being admitted to ICU from ED. LCSW  will transfer case to CSW on unit once bed assigned and patient transfered.   Assessment/plan status:  Psychosocial Support/Ongoing Assessment of Needs Other assessment/ plan:   ?return to current SNF or follow up with new SNF do to family concerns.   Information/referral to community resources:   FL2 updated    PATIENT'S/FAMILY'S RESPONSE TO PLAN OF CARE: Patient and family agreeable with plan of care and per report happy to know patient is being admitted and taken care of in ICU. Family voices frustration with SNF and adequate care given as they feel patient has been progressively getting worse and SNF did not act quick enough. Emotional support given to family and discussion of new bed search could be completed for patient if meeting criteria for insurance benefits.  Patient has medicare and needs a three day qualifying stay.  Family appreciative of support and information.  Will follow up with unit CSW.     Lane Hacker, MSW Clinical Social Work: Emergency Room [phone]

## 2014-03-06 NOTE — Procedures (Signed)
Successful perc cholecystostomy No comp Stable Gs/cx sent Keep to gravity bag

## 2014-03-06 NOTE — Progress Notes (Signed)
Patient transported from ED to 2M08 on vent. No complications noted.

## 2014-03-06 NOTE — Progress Notes (Signed)
   03/06/14 0600  Clinical Encounter Type  Visited With Patient  Visit Type Initial;Critical Care;ED  Stress Factors  Family Stress Factors Health changes;Lack of knowledge   Chaplain was paged to the ED at 6:19 AM. Chaplain was notified that the family of the patient were in the ED lobby. Chaplain introduced himself to the family. One of the patient's family members, a sister-in-law identified herself as a Chartered certified accountant at [employer]. The patient's family was anxious to know about the condition of the patient. Chaplain escorted family to a consultation room. Chaplain was present while physician consulted with family regarding the condition of the patient. Patient is critically ill and family was informed that he will be transferred to an ICU. Patient's family was relieved to have knowledge about the patient's condition and became much more calm. Family was thankful that the patient is being provided the intensive care that he needs. Family did express a little frustration with the prior facility that had been caring for the patient and they think the facility waited until things got to bad to transfer the patient to the hospital. Family is now in consult B waiting to visit with the patient. Chaplain will continue to provide emotional and spiritual support for patient and patient's family as needed. Dajanay Northrup R, Chaplain  7:00 AM

## 2014-03-06 NOTE — ED Notes (Signed)
Claudine Mouton, MD notified of abnormal lab test results

## 2014-03-06 NOTE — ED Provider Notes (Signed)
CSN: [id number]     Arrival date & time 03/06/14  0548 History   First MD Initiated Contact with Patient 03/06/14 [phone]     Chief Complaint  Patient presents with  . Code Stroke     (Consider location/radiation/quality/duration/timing/severity/associated sxs/prior Treatment) HPI Gabriel Lynch is a 69yo male PMH of ulceratice colitis, DC, HTN, hypothyroid presenting today with AMS.  Patient was last seen normal 48 hours ago at nursing facility.  Unable to provide any further history to EMS.  Patient can not provide his own history due to AMS.   Past Medical History  Diagnosis Date  . Ulcerative colitis   . Diabetes mellitus without complication   . Hypertension   . Iron deficiency anemia   . Hypothyroidism    History reviewed. No pertinent past surgical history. History reviewed. No pertinent family history. History  Substance Use Topics  . Smoking status: Not on file  . Smokeless tobacco: Not on file  . Alcohol Use: Not on file    Review of Systems  Unable to perform ROS: Mental status change      Allergies  Influenza vaccines  Home Medications   Prior to Admission medications   Medication Sig Start Date End Date Taking? Authorizing Provider  acetaminophen (TYLENOL) 325 MG tablet Take 650 mg by mouth every 4 (four) hours as needed for mild pain, fever or headache.   Yes Historical Provider, MD  Amino Acids-Protein Hydrolys (FEEDING SUPPLEMENT, PRO-STAT SUGAR FREE 64,) LIQD Take 30 mLs by mouth 2 (two) times daily.   Yes Historical Provider, MD  amLODipine (NORVASC) 5 MG tablet Take 5 mg by mouth daily.   Yes Historical Provider, MD  feeding supplement, GLUCERNA SHAKE, (GLUCERNA SHAKE) LIQD Take 237 mLs by mouth 2 (two) times daily between meals.   Yes Historical Provider, MD  ferrous gluconate (FERGON) 324 MG tablet Take 324 mg by mouth 3 (three) times daily.   Yes Historical Provider, MD  furosemide (LASIX) 20 MG tablet Take 20 mg by mouth daily.   Yes Historical  Provider, MD  glipiZIDE (GLUCOTROL) 10 MG tablet Take 10 mg by mouth daily before breakfast.   Yes Historical Provider, MD  levothyroxine (SYNTHROID, LEVOTHROID) 100 MCG tablet Take 100 mcg by mouth daily before breakfast.   Yes Historical Provider, MD  metFORMIN (GLUCOPHAGE) 1000 MG tablet Take 1,000 mg by mouth 2 (two) times daily with a meal.   Yes Historical Provider, MD  metoprolol succinate (TOPROL-XL) 100 MG 24 hr tablet Take 100 mg by mouth daily. Take with or immediately following a meal.   Yes Historical Provider, MD  Multiple Vitamin (MULTIVITAMIN WITH MINERALS) TABS tablet Take 1 tablet by mouth daily.   Yes Historical Provider, MD  Oxycodone HCl 10 MG TABS Take 1 mg by mouth every 4 (four) hours.   Yes Historical Provider, MD  potassium chloride (K-DUR,KLOR-CON) 10 MEQ tablet Take 10 mEq by mouth daily.   Yes Historical Provider, MD  tamsulosin (FLOMAX) 0.4 MG CAPS capsule Take 0.4 mg by mouth daily after breakfast.   Yes Historical Provider, MD   BP 114/41 mmHg  Pulse 60  Temp(Src)  (Rectal)  Resp 15  Wt 139 lb (63.05 kg)  SpO2 100% Physical Exam  Constitutional: He is oriented to person, place, and time. Vital signs are normal. He appears well-developed and well-nourished.  Non-toxic appearance. He does not appear ill. No distress.  HENT:  Head: Normocephalic and atraumatic.  Nose: Nose normal.  Mouth/Throat: Oropharynx is clear and moist.  No oropharyngeal exudate.  Eyes: Conjunctivae and EOM are normal. Pupils are equal, round, and reactive to light. No scleral icterus.  Neck: Normal range of motion. Neck supple. No tracheal deviation, no edema, no erythema and normal range of motion present. No thyroid mass and no thyromegaly present.  Cardiovascular: Normal rate, regular rhythm, S1 normal, S2 normal, normal heart sounds, intact distal pulses and normal pulses.  Exam reveals no gallop and no friction rub.   No murmur heard. Pulses:      Radial pulses are 2+ on the right  side, and 2+ on the left side.       Dorsalis pedis pulses are 2+ on the right side, and 2+ on the left side.  Pulmonary/Chest: Effort normal and breath sounds normal. No respiratory distress. He has no wheezes. He has no rhonchi. He has no rales.  Abdominal: Soft. Normal appearance and bowel sounds are normal. He exhibits no distension, no ascites and no mass. There is no hepatosplenomegaly. There is no tenderness. There is no rebound, no guarding and no CVA tenderness.  Musculoskeletal: Normal range of motion. He exhibits no edema or tenderness.  Lymphadenopathy:    He has no cervical adenopathy.  Neurological: He is alert and oriented to person, place, and time. He has normal strength. No cranial nerve deficit or sensory deficit.  Skin: Skin is warm, dry and intact. No petechiae and no rash noted. He is not diaphoretic. No erythema. No pallor.  Psychiatric: He has a normal mood and affect. His behavior is normal. Judgment normal.  Nursing note and vitals reviewed.   ED Course  Procedures (including critical care time) Labs Review Labs Reviewed  PROTIME-INR - Abnormal; Notable for the following:    Prothrombin Time 18.6 (*)    INR 1.54 (*)    All other components within normal limits  APTT - Abnormal; Notable for the following:    aPTT 42 (*)    All other components within normal limits  CBC - Abnormal; Notable for the following:    WBC 52.6 (*)    RBC 3.23 (*)    Hemoglobin 8.8 (*)    HCT 30.8 (*)    MCHC 28.6 (*)    RDW 17.4 (*)    Platelets 1036 (*)    All other components within normal limits  DIFFERENTIAL - Abnormal; Notable for the following:    Neutrophils Relative % 88 (*)    Lymphocytes Relative 4 (*)    Neutro Abs 46.3 (*)    Monocytes Absolute 4.2 (*)    All other components within normal limits  COMPREHENSIVE METABOLIC PANEL - Abnormal; Notable for the following:    Sodium 128 (*)    Potassium >7.5 (*)    CO2 7 (*)    Glucose, Bld 185 (*)    BUN 118 (*)     Creatinine, Ser 8.59 (*)    Calcium 10.7 (*)    Albumin 2.6 (*)    Alkaline Phosphatase 296 (*)    GFR calc non Af Amer 6 (*)    GFR calc Af Amer 7 (*)    Anion gap 25 (*)    All other components within normal limits  I-STAT CG4 LACTIC ACID, ED - Abnormal; Notable for the following:    Lactic Acid, Venous 12.30 (*)    All other components within normal limits  CBG MONITORING, ED - Abnormal; Notable for the following:    Glucose-Capillary 254 (*)    All other components within normal  limits  I-STAT ARTERIAL BLOOD GAS, ED - Abnormal; Notable for the following:    pH, Arterial 6.785 (*)    pCO2 arterial 33.4 (*)    pO2, Arterial 540.0 (*)    Bicarbonate 5.0 (*)    Acid-base deficit 28.0 (*)    All other components within normal limits  CULTURE, BLOOD (ROUTINE X 2)  CULTURE, BLOOD (ROUTINE X 2)  ETHANOL  URINE RAPID DRUG SCREEN (HOSP PERFORMED)  URINALYSIS, ROUTINE W REFLEX MICROSCOPIC  PATHOLOGIST SMEAR REVIEW  I-STAT CHEM 8, ED  I-STAT TROPOININ, ED  I-STAT TROPOININ, ED  I-STAT CHEM 8, ED  I-STAT CG4 LACTIC ACID, ED  TYPE AND SCREEN    Imaging Review Dg Chest Portable 1 View  03/06/2014   CLINICAL DATA:  ET and OG tube placement.  EXAM: PORTABLE CHEST - 1 VIEW  COMPARISON:  None.  FINDINGS: Endotracheal tube is at the level of the clavicular heads. Orogastric tube extends into the stomach. The lungs are expanded. There is a deep costophrenic angle on the right, suggesting a small pneumothorax but this is not confirmed by identification of the pleural line. No large pneumothorax is evident. No effusion is evident.  IMPRESSION: ET and OG tube as described.  Questionable small right pneumothorax.   Electronically Signed   By: Andreas Newport M.D.   On: 03/06/2014 06:44     EKG Interpretation   Date/Time:  Friday March 06 2014 06:02:51 EST Ventricular Rate:  51 PR Interval:  235 QRS Duration: 146 QT Interval:  486 QTC Calculation: 448 R Axis:   76 Text  Interpretation:  Hyperkalemia Confirmed by Glynn Octave  [phone]) on 03/06/2014 6:15:44 AM      MDM   Final diagnoses:  Fall  Hyperkalemia    Patient presents to the ED with AMS.  He was initially taken to the resus bay for intubation prior to CT.  Monitor reveals HR in the 20s, wide complex, peaked Twaves, consistent with hyperkalemia.  He was immediately given calcium, insulin, dextrose for treatment.  2L IVF given, HR rose to 60s.  At this time I believe intubation is safe.  He was intubated without difficulty, broad spectrum antibiotics given.  ICU and nephrology were called for admission and consultation.  Bear hugger applied for hypothermia to 27.2 C.  PH is 6.7, lactate is >12, WBC>50.  I spoke with family that the patient is in a very critical place and may not survive this admission.  Levophed and vasopressin was added for BP support with GOAL MAP>65.  Will repeat lactate and potassium levels after fluids.     CRITICAL CARE Performed by: Everlene Balls   Total critical care time: 65min.  Critical care time was exclusive of separately billable procedures and treating other patients.  Critical care was necessary to treat or prevent imminent or life-threatening deterioration.  Critical care was time spent personally by me on the following activities: development of treatment plan with patient and/or surrogate as well as nursing, discussions with consultants, evaluation of patient's response to treatment, examination of patient, obtaining history from patient or surrogate, ordering and performing treatments and interventions, ordering and review of laboratory studies, ordering and review of radiographic studies, pulse oximetry and re-evaluation of patient's condition.    INTUBATION Performed by: Everlene Balls  Required items: required blood products, implants, devices, and special equipment available Patient identity confirmed: provided demographic data and hospital-assigned  identification number Time out: Immediately prior to procedure a "time out" was called to verify the correct  patient, procedure, equipment, support staff and site/side marked as required.  Indications: Altered Mental Status  Intubation method: Direct Laryngoscopy   Preoxygenation: 100% BVM  and 15L [place] Sedatives: 10mg  Etomidate Paralytic: 100mg  rocuronium  Tube Size: 8-0 cuffed  Post-procedure assessment: chest rise and ETCO2 monitor Breath sounds: equal and absent over the epigastrium Tube secured with: ETT holder Chest x-ray interpreted by radiologist and me.  Chest x-ray findings: endotracheal tube in appropriate position  Patient tolerated the procedure well with no immediate complications.     Everlene Balls, MD 03/06/14 [phone]

## 2014-03-06 NOTE — H&P (Addendum)
PULMONARY / CRITICAL CARE MEDICINE   Name: Gabriel Lynch MRN: [id number] DOB: [birth year]    ADMISSION DATE:  03/06/2014  REFERRING MD :  EDP  CHIEF COMPLAINT:  Brought in unresponsive  INITIAL PRESENTATION: 69 year old, nursing home resident, with ulcerative colitis/colostomy and wound VAC to sacral wound, BIBEMS unresponsive, with septic shock and AKI  STUDIES:  CT abdomen 2/19  SIGNIFICANT EVENTS: 2/19 ETT >>   HISTORY OF PRESENT ILLNESS:  69 year old, diabetic, hypertensive, ulcerative colitis-had multiple surgeries in [facility] ultimately requiring colostomy, also had a spider bite with skin and soft tissue wound requiring wound VAC,Was transferred to [facility] care in January 2016-to be closer to family. He also has history of left LE brown recluse spider bite 06/2013 necessitating wound vac/hyperbaric treatment. Was apparently wheelchair bound and participating in rehabilitation, became lethargic over the past week, diagnosed with UTI and Cipro started 2/17 but became unresponsive on 2/19 and brought by EMS to ED. Was extremely hypertensive requiring Levophed, initial labs showed hyperkalemia, bradycardia-treated with calcium gluconate, D50 insulin and sodium bicarbonate, repeat labs pending, lactate of 13. History obtained from speaking to family members   PAST MEDICAL HISTORY :   has a past medical history of Ulcerative colitis; Diabetes mellitus without complication; Hypertension; Iron deficiency anemia; and Hypothyroidism.  has no past surgical history on file. Prior to Admission medications   Medication Sig Start Date End Date Taking? Authorizing Provider  acetaminophen (TYLENOL) 325 MG tablet Take 650 mg by mouth every 4 (four) hours as needed for mild pain, fever or headache.   Yes Historical Provider, MD  Amino Acids-Protein Hydrolys (FEEDING SUPPLEMENT, PRO-STAT SUGAR FREE 64,) LIQD Take 30 mLs by mouth 2 (two) times daily.   Yes Historical Provider,  MD  amLODipine (NORVASC) 5 MG tablet Take 5 mg by mouth daily.   Yes Historical Provider, MD  feeding supplement, GLUCERNA SHAKE, (GLUCERNA SHAKE) LIQD Take 237 mLs by mouth 2 (two) times daily between meals.   Yes Historical Provider, MD  ferrous gluconate (FERGON) 324 MG tablet Take 324 mg by mouth 3 (three) times daily.   Yes Historical Provider, MD  furosemide (LASIX) 20 MG tablet Take 20 mg by mouth daily.   Yes Historical Provider, MD  glipiZIDE (GLUCOTROL) 10 MG tablet Take 10 mg by mouth daily before breakfast.   Yes Historical Provider, MD  levothyroxine (SYNTHROID, LEVOTHROID) 100 MCG tablet Take 100 mcg by mouth daily before breakfast.   Yes Historical Provider, MD  metFORMIN (GLUCOPHAGE) 1000 MG tablet Take 1,000 mg by mouth 2 (two) times daily with a meal.   Yes Historical Provider, MD  metoprolol succinate (TOPROL-XL) 100 MG 24 hr tablet Take 100 mg by mouth daily. Take with or immediately following a meal.   Yes Historical Provider, MD  Multiple Vitamin (MULTIVITAMIN WITH MINERALS) TABS tablet Take 1 tablet by mouth daily.   Yes Historical Provider, MD  Oxycodone HCl 10 MG TABS Take 1 mg by mouth every 4 (four) hours.   Yes Historical Provider, MD  potassium chloride (K-DUR,KLOR-CON) 10 MEQ tablet Take 10 mEq by mouth daily.   Yes Historical Provider, MD  tamsulosin (FLOMAX) 0.4 MG CAPS capsule Take 0.4 mg by mouth daily after breakfast.   Yes Historical Provider, MD   Allergies  Allergen Reactions  . Influenza Vaccines Other (See Comments)    ON MAR    FAMILY HISTORY:  has no family status information on file.  SOCIAL HISTORY:    REVIEW OF SYSTEMS:  Unable to  obtain  SUBJECTIVE:   VITAL SIGNS: Pulse Rate:  [60-72] 72 (02/19 0749) Resp:  [13-33] 19 (02/19 0745) BP: (86-119)/(37-99) 112/41 mmHg (02/19 0749) SpO2:  [94 %-100 %] 100 % (02/19 0749) FiO2 (%):  [40 %-100 %] 40 % (02/19 0749) Weight:  [63.05 kg (139 lb)] 63.05 kg (139 lb) (02/19 0652) HEMODYNAMICS:    VENTILATOR SETTINGS: Vent Mode:  [-] PRVC FiO2 (%):  [40 %-100 %] 40 % Set Rate:  [15 bmp-18 bmp] 18 bmp Vt Set:  [500 mL-600 mL] 600 mL PEEP:  [5 cmH20] 5 cmH20 Plateau Pressure:  [16 cmH20-17 cmH20] 17 cmH20 INTAKE / OUTPUT:  Intake/Output Summary (Last 24 hours) at 03/06/14 0805 Last data filed at 03/06/14 1027  Gross per 24 hour  Intake   2050 ml  Output      0 ml  Net   2050 ml    PHYSICAL EXAMINATION: General:  Chronically ill-appearing , intubated , sedated  Neuro:  Unresponsive  HEENT:  Right glass eye , left pupil 3 mm not reactive to light , no JVD  Cardiovascular:  S1-S2 bradycardia  Lungs:  Decreased breath sounds bilateral , air entry in bilateral lung fields  Abdomen:  Soft , colostomy with dark brown output  Musculoskeletal:  Wound VAC over left thigh , left shin with chronic appearing clean wound , some erythema , no discharge  Skin:  Flaky scaling over both feet  LABS:  CBC  Recent Labs Lab 03/06/14 0554  WBC 52.6*  HGB 8.8*  HCT 30.8*  PLT 1036*   Coag's  Recent Labs Lab 03/06/14 0554  APTT 42*  INR 1.54*   BMET  Recent Labs Lab 03/06/14 0554  NA 128*  K >7.5*  CL 96  CO2 7*  BUN 118*  CREATININE 8.59*  GLUCOSE 185*   Electrolytes  Recent Labs Lab 03/06/14 0554  CALCIUM 10.7*   Sepsis Markers  Recent Labs Lab 03/06/14 0555 03/06/14 0744  LATICACIDVEN 12.30* 10.70*   ABG  Recent Labs Lab 03/06/14 0649  PHART 6.785*  PCO2ART 33.4*  PO2ART 540.0*   Liver Enzymes  Recent Labs Lab 03/06/14 0554  AST 27  ALT 20  ALKPHOS 296*  BILITOT 0.3  ALBUMIN 2.6*   Cardiac Enzymes No results for input(s): TROPONINI, PROBNP in the last 168 hours. Glucose  Recent Labs Lab 03/06/14 0647  GLUCAP 254*    Imaging No results found.   ASSESSMENT / PLAN:  PULMONARY OETT2/19  A:Acute respiratory failure due to shock  P:   vent bundle  Repeat ABG   CARDIOVASCULAR CVL2/19 >> A: Septic shock  P:  3 L fluid  given in ED  Repeat lactate for clearance  Add vasopressin to Levophed drip  Central line will be obtained   RENAL A:  AKI Hyperkalemia  Severe metabolic acidosis  P:   O53 insulin /calcium /sodium bicarbonate given  Repeat potassium pending  Start Kayexalate  Bicarbonate drip at 125 an hour   GASTROINTESTINAL A:  Ulcerative colitis , colostomy  P:   Nothing by mouth  HEMATOLOGIC A:  Leukemoid reaction  Anemia of chronic illness  P:  Follow  INFECTIOUS A:  Septic shock -source is possibly UTI , wounds  CT abdomen pending  P:   BCx2 2/19  UC 2/19  Sputum2/19  Abx: Cefepime/vancomycin, start date2/19 >>  ENDOCRINE A:  Risk for hypoglycemia   P:   SSI order set  NEUROLOGIC A:  Acute encephalopathy -related to shock and metabolic  P:  RASS goal: 0 intermittent fentanyl  FAMILY  - Updates: Brother and sister at bedside, they desire for resuscitation  - Inter-disciplinary family meet or Palliative Care meeting due by:  2/26    TODAY'S SUMMARY: Septic shock, AKI with hyperkalemia- source could be UTI, abdomen or wounds-CT scan pending    The patient is critically ill with multiple organ systems failure and requires high complexity decision making for assessment and support, frequent evaluation and titration of therapies, application of advanced monitoring technologies and extensive interpretation of multiple databases. Critical Care Time devoted to patient care services described in this note independent of APP time is 55 minutes.    Rigoberto Noel MD  03/06/2014, 8:05 AM

## 2014-03-06 NOTE — Progress Notes (Addendum)
INITIAL NUTRITION ASSESSMENT  DOCUMENTATION CODES Per approved criteria  -Not Applicable   INTERVENTION:  If unable to extubate patient within next 24 hours and supportive care to continue, recommend initiate TF via OGT with Vital AF 1.2 at 25 ml/h and Prostat 30 ml BID on day 1; on day 2, d/c Prostat and increase to goal rate of 60 ml/h (1440 ml per day) to provide 1728 kcals, 108 gm protein, 1168 ml free water daily.  NUTRITION DIAGNOSIS: Inadequate oral intake related to inability to eat as evidenced by NPO status.   Goal: Intake to meet >90% of estimated nutrition needs.  Monitor:  TF initiation/tolerance, weight trend, labs, vent status.  Reason for Assessment: VDRF  69 y.o. male  Admitting Dx: septic shock and AKI  ASSESSMENT: Patient brought to ED by EMS on 2/19 unresponsive, with septic shock, AKI, and cholecystitis. He has a history of ulcerative colitis, colostomy, spider bite in June 2015 necessitating wound VAC. Wheelchair bound at [facility].  Discussed patient in ICU rounds today. Patient is not stable enough to start TF at this time. Unable to complete nutrition focused physical exam at this time. Patient is at nutrition risk given 80% of ideal weight and wounds requiring increased protein and calorie needs to support healing. Currently on CRRT. S/P percutaneous cholecystostomy today.   Patient is currently intubated on ventilator support MV: 12.8 L/min Temp (24hrs), Avg:90.1 F (32.3 C), Min:90.1 F (32.3 C), Max:90.1 F (32.3 C)  Propofol: none   Height: Ht Readings from Last 1 Encounters:  03/06/14 5\' 10"  (1.778 m)    Weight: Wt Readings from Last 1 Encounters:  03/06/14 133 lb 2.5 oz (60.4 kg)    Ideal Body Weight: 75.5 kg  % Ideal Body Weight: 80%  Wt Readings from Last 10 Encounters:  03/06/14 133 lb 2.5 oz (60.4 kg)    Usual Body Weight: unknown  % Usual Body Weight: N/A  BMI:  Body mass index is 19.11 kg/(m^2).  Estimated Nutritional  Needs: Kcal: 1700 Protein: 90-110 gm Fluid: 2 L  Skin: stage 4 sacral pressure ulcer, spider bite to LLE   Diet Order: Diet NPO time specified  EDUCATION NEEDS: -Education not appropriate at this time   Intake/Output Summary (Last 24 hours) at 03/06/14 1326 Last data filed at 03/06/14 1300  Gross per 24 hour  Intake   2050 ml  Output    101 ml  Net   1949 ml    Last BM: unknown   Labs:   Recent Labs Lab 03/06/14 0554 03/06/14 0738 03/06/14 1157  NA 128* 133* 130*  K >7.5* 6.2* 5.4*  CL 96 105  --   CO2 7* 9*  --   BUN 118* 105*  --   CREATININE 8.59* 6.75*  --   CALCIUM 10.7* 10.8*  --   GLUCOSE 185* 439*  --     CBG (last 3)   Recent Labs  03/06/14 0647 03/06/14 0812 03/06/14 1116  GLUCAP 254* 358* 303*    Scheduled Meds: . ceFEPime (MAXIPIME) IV  2 g Intravenous Q12H  . Chlorhexidine Gluconate Cloth  6 each Topical Q0600  . heparin  5,000 Units Subcutaneous 3 times per day  . insulin aspart  2-6 Units Subcutaneous 6 times per day  . lidocaine      . mupirocin ointment  1 application Nasal BID  . pantoprazole (PROTONIX) IV  40 mg Intravenous QHS  . sodium chloride  1,000 mL Intravenous Once  . sodium polystyrene  60 g Oral TID  . [START ON 03/07/2014] vancomycin  750 mg Intravenous Q24H    Continuous Infusions: . sodium chloride    . sodium chloride    . norepinephrine (LEVOPHED) Adult infusion 15 mcg/min (03/06/14 1245)  . dialysate (PRISMASATE) 2,000 mL/hr at 03/06/14 1230  . sodium bicarbonate (isotonic) 1000 mL infusion 125 mL/hr at 03/06/14 1230  . sodium bicarbonate (isotonic) 1000 mL infusion 200 mL/hr at 03/06/14 1230  . vasopressin (PITRESSIN) infusion - *FOR SHOCK* 0.03 Units/min (03/06/14 1000)    Past Medical History  Diagnosis Date  . Ulcerative colitis   . Diabetes mellitus without complication   . Hypertension   . Iron deficiency anemia   . Hypothyroidism     Past Surgical History  Procedure Laterality Date  . Back  surgery      multiple surgeries per family  . 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 Appendectomy    . Debridement of sacral decubitus ulcer  01-26-14    Molli Barrows, RD, LDN, [place] Pager [phone] After Hours Pager [phone]

## 2014-03-06 NOTE — Care Management Note (Addendum)
    Page 1 of 2   03/11/2014     8:24:43 AM CARE MANAGEMENT NOTE 03/11/2014  Patient:  Gabriel Lynch, Gabriel Lynch   Account Number:  [id number]  Date Initiated:  03/06/2014  Documentation initiated by:  Elissa Hefty  Subjective/Objective Assessment:   adm w unresponsive, vent     Action/Plan:   from nsg facility   Anticipated DC Date:  03/10/2014   Anticipated DC Plan:  SKILLED NURSING FACILITY  In-house referral  Clinical Social Worker      DC Planning Services  CM consult      Choice offered to / List presented to:             Status of service:  Completed, signed off Medicare Important Message given?  YES (If response is "NO", the following Medicare IM given date fields will be blank) Date Medicare IM given:  03/09/2014 Medicare IM given by:  [facility] Date Additional Medicare IM given:   Additional Medicare IM given by:    Discharge Disposition:  LONG TERM ACUTE CARE (LTAC)  Per UR Regulation:  Reviewed for med. necessity/level of care/duration of stay  If discussed at [place] of Stay Meetings, dates discussed:    Comments:  Contact:  Korey, Prashad Son [phone]      Juliet Rude [phone]  03/10/14 [place], BSN [phone] NCM spoke with patients wife , informed her [facility] and [facility] has bed offers for her spouse, she stated she wants him to go to [facility].  NCM informed Tommy with [facility], he states he can take patient today.  Patient is for dc to [facility] today.  03-09-14 2:15pm Luz Lex, RNBSN [phone] From SNF.  SW aware.  Sister does not want to return to this facility.  is eligible for [facility] benefits. Has benefits for both [facility] and [facility].  Surgery wants to see patient again tomorrow to access sacral wound.  [facility] has a Psychologist, sport and exercise on site 3 days a week and available for consult.  Talked with sister about options, would like her brother to go to an [facility] but agreeable to return to SNF as second choice, but a different SNF.  SW  updated on this also.

## 2014-03-06 NOTE — Progress Notes (Signed)
   03/06/14 0800  Clinical Encounter Type  Visited With Patient and family together;Health care provider  Visit Type Follow-up;Critical Care;ED  Stress Factors  Family Stress Factors Health changes   Chaplain was paged to the ED at 7:34 AM and notified that the patient's family was at bedside and that additional support was recommended. When chaplain arrived, patient's family were talking through the past week with the nurse. Patient remains critically ill but it appears will be transferred to an ICU bed soon. Patient went to a CT scan. Family is still in the patient's room. Family is still upset about the situation and noted that the patient was functioning pretty normally over a week ago. Patient's sister is having the hardest time at the moment and seems overwhelmed. Chaplain is going to ask one of the day chaplains to follow up with family. Gar Ponto, Chaplain  8:33 AM

## 2014-03-06 NOTE — Progress Notes (Signed)
Inpatient Diabetes Program Recommendations  AACE/ADA: New Consensus Statement on Inpatient Glycemic Control (2013)  Target Ranges:  Prepandial:   less than 140 mg/dL      Peak postprandial:   less than 180 mg/dL (1-2 hours)      Critically ill patients:  140 - 180 mg/dL   Results for REYNOLD, MANTELL (MRN [id number]) as of 03/06/2014 14:33  Ref. Range 03/06/2014 06:47 03/06/2014 08:12 03/06/2014 11:16  Glucose-Capillary Latest Range: 70-99 mg/dL 254 (H) 358 (H) 303 (H)    Diabetes history: DM Outpatient Diabetes medications: Glipizide 10 mg Daily, Metformin 1,000 mg BID Current orders for Inpatient glycemic control: Novolog 2-6 units Q4hrs  Inpatient Diabetes Program Recommendations Insulin - IV drip/GlucoStabilizer: Patient's glucose this am 358 mg/dl and the latest 303 at 1116. Please start Phase 2 of the ICU Glycemic Control order set.  Thanks,  Tama Headings RN, MSN, [facility] Inpatient Diabetes Coordinator Team Pager [phone]

## 2014-03-06 NOTE — ED Notes (Signed)
Unable to obtain temperature at this time, pt intubated, rectal temp reading 87.9. Will place a temp foley.

## 2014-03-06 NOTE — Progress Notes (Signed)
He was transferred to the ICU, hemodialysis catheter was placed and CRRT initiated. Potassium improved to 6.2 -hyperkalemic T waves persist on monitor. CT abdomen was suggestive of acute cholecystitis. No pneumothorax  Sacral wound was examined-appeared to be bone deep with necrotic edges. Wound care and surgery consulted. Based on discussion, he will need percutaneous cholecystostomy. Interventional radiology was involved. Would like his metabolic parameters to improve somewhat before transport to radiology. Detailed update given to family including sons- high mortality explained, they would like to continue resuscitative measures for now.  Additional critical care time x 35 minutes  Rigoberto Noel. MD

## 2014-03-06 NOTE — ED Notes (Signed)
MD at bedside. 

## 2014-03-06 NOTE — ED Notes (Signed)
Accompanying pt to ct

## 2014-03-06 NOTE — Consult Note (Addendum)
HPI: I was asked by Dr. Claudine Mouton to see Gabriel Lynch who is a 69 y.o. male brought in from [facility] with altered mental status and found to have be hypothermic, hypotensive, bradycardic to 20, wide complex rhythm and peaked T waves; was treated with calcium, insulin, dextrose and bicarbonate with improvement in HR to 60.  Potassium was reported at>7.8 and creatinine 8.59, pH 6.78.  Renal requested to assist in care.  Of note, pt has a history of ulcerative colitis with treatment including colostomy, wounds requiring wound vac and recent brown recluse spider bite to LLE.    Past Medical History  Diagnosis Date  . Ulcerative colitis   . Diabetes mellitus without complication   . Hypertension   . Iron deficiency anemia   . Hypothyroidism    History reviewed. No pertinent past surgical history. Social History:  has no tobacco, alcohol, and drug history on file. Allergies:  Allergies  Allergen Reactions  . Influenza Vaccines Other (See Comments)    ON MAR   History reviewed. No pertinent family history.  Medications: Not available   ROS: Cannot obtain Blood pressure 110/41, pulse 60, resp. rate 15, weight 63.05 kg (139 lb), SpO2 100 %.  General appearance: unresponsive and intubated  thin  Head: Normocephalic, without obvious abnormality, atraumatic Resp: clear to auscultation bilaterally Chest wall: no tenderness Cardio: regular rate and rhythm, S1, S2 normal, no murmur, click, rub or gallop GI: NE Extremities: thin, bandage at LLE Skin: reduced skin turgor Neurologic:unresponsive  Results for orders placed or performed during the hospital encounter of 03/06/14 (from the past 48 hour(s))  Ethanol     Status: None   Collection Time: 03/06/14  5:54 AM  Result Value Ref Range   Alcohol, Ethyl (B) <5 0 - 9 mg/dL    Comment:        LOWEST DETECTABLE LIMIT FOR SERUM ALCOHOL IS 11 mg/dL FOR MEDICAL PURPOSES ONLY   I-Stat Troponin, ED (not at [facility])     Status: None   Collection Time: 03/06/14  5:54 AM  Result Value Ref Range   Troponin i, poc 0.03 0.00 - 0.08 ng/mL   Comment 3            Comment: Due to the release kinetics of cTnI, a negative result within the first hours of the onset of symptoms does not rule out myocardial infarction with certainty. If myocardial infarction is still suspected, repeat the test at appropriate intervals.   Protime-INR     Status: Abnormal   Collection Time: 03/06/14  5:54 AM  Result Value Ref Range   Prothrombin Time 18.6 (H) 11.6 - 15.2 seconds   INR 1.54 (H) 0.00 - 1.49  APTT     Status: Abnormal   Collection Time: 03/06/14  5:54 AM  Result Value Ref Range   aPTT 42 (H) 24 - 37 seconds    Comment:        IF BASELINE aPTT IS ELEVATED, SUGGEST PATIENT RISK ASSESSMENT BE USED TO DETERMINE APPROPRIATE ANTICOAGULANT THERAPY.   CBC     Status: Abnormal   Collection Time: 03/06/14  5:54 AM  Result Value Ref Range   WBC 52.6 (HH) 4.0 - 10.5 K/uL    Comment: WHITE COUNT CONFIRMED ON SMEAR CRITICAL RESULT CALLED TO, READ BACK BY AND VERIFIED WITH: JAMES CAMP,RN AT 4193 03/06/14 BY ZBEECH.    RBC 3.23 (L) 4.22 - 5.81 MIL/uL   Hemoglobin 8.8 (L) 13.0 - 17.0 g/dL   HCT 30.8 (  L) 39.0 - 52.0 %   MCV 95.4 78.0 - 100.0 fL   MCH 27.2 26.0 - 34.0 pg   MCHC 28.6 (L) 30.0 - 36.0 g/dL   RDW 17.4 (H) 11.5 - 15.5 %   Platelets 1036 (HH) 150 - 400 K/uL    Comment: PLATELET COUNT CONFIRMED BY SMEAR CRITICAL RESULT CALLED TO, READ BACK BY AND VERIFIED WITH: JAMES CAMP,RN AT 3500 03/06/14 BY ZBEECH.   Differential     Status: Abnormal   Collection Time: 03/06/14  5:54 AM  Result Value Ref Range   Neutrophils Relative % 88 (H) 43 - 77 %   Lymphocytes Relative 4 (L) 12 - 46 %   Monocytes Relative 8 3 - 12 %   Eosinophils Relative 0 0 - 5 %   Basophils Relative 0 0 - 1 %   Neutro Abs 46.3 (H) 1.7 - 7.7 K/uL   Lymphs Abs 2.1 0.7 - 4.0 K/uL   Monocytes Absolute 4.2 (H) 0.1 - 1.0 K/uL   Eosinophils Absolute 0.0 0.0 - 0.7  K/uL   Basophils Absolute 0.0 0.0 - 0.1 K/uL   RBC Morphology POLYCHROMASIA PRESENT    WBC Morphology MILD LEFT SHIFT (1-5% METAS, OCC MYELO, OCC BANDS)   Comprehensive metabolic panel     Status: Abnormal   Collection Time: 03/06/14  5:54 AM  Result Value Ref Range   Sodium 128 (L) 135 - 145 mmol/L   Potassium >7.5 (HH) 3.5 - 5.1 mmol/L    Comment: NO VISIBLE HEMOLYSIS REPEATED TO VERIFY CRITICAL RESULT CALLED TO, READ BACK BY AND VERIFIED WITH: B.MICHAELSON,RN 03/06/14 0712 BY BSLADE    Chloride 96 96 - 112 mmol/L   CO2 7 (LL) 19 - 32 mmol/L    Comment: REPEATED TO VERIFY CRITICAL RESULT CALLED TO, READ BACK BY AND VERIFIED WITH: B.MICHAELSON,RN 03/06/14 0712 BY BSLADE    Glucose, Bld 185 (H) 70 - 99 mg/dL   BUN 118 (H) 6 - 23 mg/dL   Creatinine, Ser 8.59 (H) 0.50 - 1.35 mg/dL   Calcium 10.7 (H) 8.4 - 10.5 mg/dL   Total Protein 7.6 6.0 - 8.3 g/dL   Albumin 2.6 (L) 3.5 - 5.2 g/dL   AST 27 0 - 37 U/L   ALT 20 0 - 53 U/L   Alkaline Phosphatase 296 (H) 39 - 117 U/L   Total Bilirubin 0.3 0.3 - 1.2 mg/dL   GFR calc non Af Amer 6 (L) >90 mL/min   GFR calc Af Amer 7 (L) >90 mL/min    Comment: (NOTE) The eGFR has been calculated using the CKD EPI equation. This calculation has not been validated in all clinical situations. eGFR's persistently <90 mL/min signify possible Chronic Kidney Disease.    Anion gap 25 (H) 5 - 15  Type and screen     Status: None   Collection Time: 03/06/14  5:54 AM  Result Value Ref Range   ABO/RH(D) A POS    Antibody Screen NEG    Sample Expiration 03/09/2014   I-Stat CG4 Lactic Acid, ED     Status: Abnormal   Collection Time: 03/06/14  5:55 AM  Result Value Ref Range   Lactic Acid, Venous 12.30 (HH) 0.5 - 2.0 mmol/L   Comment NOTIFIED PHYSICIAN   CBG monitoring, ED     Status: Abnormal   Collection Time: 03/06/14  6:47 AM  Result Value Ref Range   Glucose-Capillary 254 (H) 70 - 99 mg/dL  I-Stat arterial blood gas, ED  Status: Abnormal    Collection Time: 03/06/14  6:49 AM  Result Value Ref Range   pH, Arterial 6.785 (LL) 7.350 - 7.450   pCO2 arterial 33.4 (L) 35.0 - 45.0 mmHg   pO2, Arterial 540.0 (H) 80.0 - 100.0 mmHg   Bicarbonate 5.0 (L) 20.0 - 24.0 mEq/L   TCO2 6 0 - 100 mmol/L   O2 Saturation 100.0 %   Acid-base deficit 28.0 (H) 0.0 - 2.0 mmol/L   Sample type ARTERIAL    Comment NOTIFIED PHYSICIAN    Dg Chest Portable 1 View  03/06/2014   CLINICAL DATA:  ET and OG tube placement.  EXAM: PORTABLE CHEST - 1 VIEW  COMPARISON:  None.  FINDINGS: Endotracheal tube is at the level of the clavicular heads. Orogastric tube extends into the stomach. The lungs are expanded. There is a deep costophrenic angle on the right, suggesting a small pneumothorax but this is not confirmed by identification of the pleural line. No large pneumothorax is evident. No effusion is evident.  IMPRESSION: ET and OG tube as described.  Questionable small right pneumothorax.   Electronically Signed   By: Andreas Newport M.D.   On: 03/06/2014 06:44    Assessment:  1 Severe hyperkalemia 2  Renal Failure, Acute v chronic 3 Dehydration 4 Lactic acidosis 5 Sepsis Plan: 1 If family and team agreeable we will support with CRRT as appropriate.  CCM will need to place HD catheter 2 Kayexalate via NGT pending that decision 3 Volume expansion with bicarbonate 4 Review CT abdomen when available Jordane Hisle C   03/06/2014, 7:38 AM

## 2014-03-06 NOTE — Progress Notes (Signed)
Filter clotted X2 Dicussed with Dr Moshe Cipro. Plans to start heparin and prime with heparinized saline

## 2014-03-06 NOTE — Progress Notes (Signed)
ANTIBIOTIC CONSULT NOTE - INITIAL  Pharmacy Consult:  Vancomycin / Cefepime Indication:  Sepsis / osteomyelitis  Allergies  Allergen Reactions  . Influenza Vaccines Other (See Comments)    ON MAR    Patient Measurements: Height: 5\' 10"  (177.8 cm) Weight: 133 lb 2.5 oz (60.4 kg) IBW/kg (Calculated) : 73  Vital Signs: Temp: 90.1 F (32.3 C) (02/19 1215) Temp Source: Oral (02/19 1215) BP: 155/61 mmHg (02/19 1231) Pulse Rate: 65 (02/19 1231) Intake/Output from previous day: 02/18 0701 - 02/19 0700 In: 2000 [I.V.:2000] Out: -  Intake/Output from this shift: Total I/O In: 50 [IV Piggyback:50] Out: 67 [Other:67]  Labs:  Recent Labs  03/06/14 0554 03/06/14 0738 03/06/14 1157  WBC 52.6* 49.0*  --   HGB 8.8* 7.0* 8.5*  PLT 1036* 699*  --   CREATININE 8.59* 6.75*  --    Estimated Creatinine Clearance: 9.1 mL/min (by C-G formula based on Cr of 6.75). No results for input(s): VANCOTROUGH, VANCOPEAK, VANCORANDOM, GENTTROUGH, GENTPEAK, GENTRANDOM, TOBRATROUGH, TOBRAPEAK, TOBRARND, AMIKACINPEAK, AMIKACINTROU, AMIKACIN in the last 72 hours.   Microbiology: Recent Results (from the past 720 hour(s))  MRSA PCR Screening     Status: Abnormal   Collection Time: 03/06/14  9:41 AM  Result Value Ref Range Status   MRSA by PCR POSITIVE (A) NEGATIVE Final    Comment:        The GeneXpert MRSA Assay (FDA approved for NASAL specimens only), is one component of a comprehensive MRSA colonization surveillance program. It is not intended to diagnose MRSA infection nor to guide or monitor treatment for MRSA infections. RESULT CALLED TO, READ BACK BY AND VERIFIED WITH: S. HARDY RN 11:40 03/06/14 ([email or web address])     Medical History: Past Medical History  Diagnosis Date  . Ulcerative colitis   . Diabetes mellitus without complication   . Hypertension   . Iron deficiency anemia   . Hypothyroidism       Assessment: 88 YOM admitted after found unresponsive at nursing home.  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  Of note, he was recently diagnosed with a UTI and his infectious history consist of a spider bite that required the placement of a wound VAC to his sacrum.  He also has AKI and is initiated on CRRT today.  Noted patient received cefepime 1gm and vancomycin 1250mg  IV x 1 around 0630, and Zosyn 3.375gm IV x 1 around 1300 today.  Vanc 2/19 >> Cefepime 2/19 >>  2/19 MRSA PCR - positive 2/19 BCx x2 -   Goal of Therapy:  Vancomycin trough level 15-20 mcg/ml   Plan:  - Vanc 750mg  IV Q24H - Cefepime 2gm IV Q12H - Monitor CRRT tolerance/interruption, clinical progress, vanc level as indicated - F/U updated height and weight (data in EPIC are estimates) - F/U resume home meds (Synthroid)   Airyn Ellzey D. Mina Marble, PharmD, BCPS Pager:  [phone] 03/06/2014, 1:08 PM

## 2014-03-06 NOTE — Procedures (Signed)
Central Venous Hemodialysis Catheter Insertion Procedure Note Gabriel Lynch [id number] 1945-03-15  Procedure: Insertion of Central Venous Hemodialysis Catheter Indications: dialysis   Procedure Details Consent: Unable to obtain consent because of emergent medical necessity. Time Out: Verified patient identification, verified procedure, site/side was marked, verified correct patient position, special equipment/implants available, medications/allergies/relevent history reviewed, required imaging and test results available.  Performed Real time [place] used to ID and cannulate the vessel  Maximum sterile technique was used including antiseptics, cap, gloves, gown, hand hygiene, mask and sheet. Skin prep: Chlorhexidine; local anesthetic administered A antimicrobial bonded/coated triple lumen catheter was placed in the right internal jugular vein using the Seldinger technique.  Evaluation Blood flow good Complications: No apparent complications Patient did tolerate procedure well. Chest X-ray ordered to verify placement.  CXR: normal.  Gabriel Lynch,PETE 03/06/2014, 10:15 AM

## 2014-03-06 NOTE — Consult Note (Addendum)
WOC wound consult note Reason for Consult: Consult requested for left leg and sacrum wound and ostomy.  Pt had a brown recluse bite to left leg prior to admission, according to the EMR Left calf with dry healing full thickness wounds.  9X2X.1cm and 6X3X.1cm.  Both are red without odor or drainage. Plan: Foam dressing to protect and promote healing.  Pt was admitted with a freedom Vac and dressing intact to sacrum wound from another facility.  Large amt strong foul odor noted, even when dressing is intact.  Freedom Vac with large amt thick brown drainage with strong foul odor.   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  Very strong foul odor when wound is uncovered, and large amt brown thick drainage inside wound bed.  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  Wound bed is 10% dark red, 90% necrotic.   Pressure Ulcer POA: Yes Periwound: Intact skin surrounding. Dressing procedure/placement/frequency: Recommend surgical consult if aggressive plan of care is desired for possible debridement.  This wound is NOT appropriate for Vac therapy R/T the extensive amt nonviable tissue in the wound bed. Moist gauze dressing to assist with absorbing drainage.  CCM at bedside to assess wound and discuss plan of care.  Recommend X-ray to R/O osteomyelitis since pt has exposed bone and is septic.  Pt is on a Sport low-airloss bed to reduce pressure.  No family at bedside to discuss the plan of care. Instructed bedside nurse to have family return the Freedom Vac to patient's facility when they come to visit.  WOC ostomy consult note Stoma type/location:  Stoma to RLQ from previous surgery several months ago. Stomal assessment/size: Stoma red and viable, slightly above skin level, 1 inch. Peristomal assessment: Intact skin surrounding Output 30cc dark brown  semi-formed stool in pouch Ostomy pouching: Applied one piece pouch.  Supplies ordered to bedside for staff nurse use.  Please re-consult if further assistance is needed.  Thank-you,  Julien Girt MSN, [place], [place], [place], [place]

## 2014-03-06 NOTE — Consult Note (Signed)
Reason for consult: percutaneous cholecystostomy Referring Physician(s): CCM  History of Present Illness: Gabriel Lynch is a 69 y.o. male, nursing home resident,  with history of DM,HTN, UC with prior colostomy, sacral wound,  left LE brown recluse spider bite 06/2013 necessitating wound vac/hyperbaric treatment and recently treated UTI who is now admitted with increased lethargy, encephalopathy, resp/renal failure currently intubated/sedated and on CRRT, septic shock (WBC 49K) and imaging findings of acute cholecystitis with free fluid surrounding GB and tracking around liver. Request now received for percutaneous cholecystostomy.   Past Medical History  Diagnosis Date  . Ulcerative colitis   . Diabetes mellitus without complication   . Hypertension   . Iron deficiency anemia   . Hypothyroidism     History reviewed. No pertinent past surgical history.  Allergies: Influenza vaccines  Medications: Prior to Admission medications   Medication Sig Start Date End Date Taking? Authorizing Provider  acetaminophen (TYLENOL) 325 MG tablet Take 650 mg by mouth every 4 (four) hours as needed for mild pain, fever or headache.   Yes Historical Provider, MD  Amino Acids-Protein Hydrolys (FEEDING SUPPLEMENT, PRO-STAT SUGAR FREE 64,) LIQD Take 30 mLs by mouth 2 (two) times daily.   Yes Historical Provider, MD  amLODipine (NORVASC) 5 MG tablet Take 5 mg by mouth daily.   Yes Historical Provider, MD  feeding supplement, GLUCERNA SHAKE, (GLUCERNA SHAKE) LIQD Take 237 mLs by mouth 2 (two) times daily between meals.   Yes Historical Provider, MD  ferrous gluconate (FERGON) 324 MG tablet Take 324 mg by mouth 3 (three) times daily.   Yes Historical Provider, MD  furosemide (LASIX) 20 MG tablet Take 20 mg by mouth daily.   Yes Historical Provider, MD  glipiZIDE (GLUCOTROL) 10 MG tablet Take 10 mg by mouth daily before breakfast.   Yes Historical Provider, MD  levothyroxine (SYNTHROID, LEVOTHROID) 100  MCG tablet Take 100 mcg by mouth daily before breakfast.   Yes Historical Provider, MD  metFORMIN (GLUCOPHAGE) 1000 MG tablet Take 1,000 mg by mouth 2 (two) times daily with a meal.   Yes Historical Provider, MD  metoprolol succinate (TOPROL-XL) 100 MG 24 hr tablet Take 100 mg by mouth daily. Take with or immediately following a meal.   Yes Historical Provider, MD  Multiple Vitamin (MULTIVITAMIN WITH MINERALS) TABS tablet Take 1 tablet by mouth daily.   Yes Historical Provider, MD  Oxycodone HCl 10 MG TABS Take 1 mg by mouth every 4 (four) hours.   Yes Historical Provider, MD  potassium chloride (K-DUR,KLOR-CON) 10 MEQ tablet Take 10 mEq by mouth daily.   Yes Historical Provider, MD  tamsulosin (FLOMAX) 0.4 MG CAPS capsule Take 0.4 mg by mouth daily after breakfast.   Yes Historical Provider, MD    History reviewed. No pertinent family history.  History   Social History  . Marital Status: Unknown    Spouse Name: N/A  . Number of Children: N/A  . Years of Education: N/A   Social History Main Topics  . Smoking status: Not on file  . Smokeless tobacco: Not on file  . Alcohol Use: Not on file  . Drug Use: Not on file  . Sexual Activity: Not on file   Other Topics Concern  . None   Social History Narrative  . None      Review of Systems: A 12 point ROS discussed and pertinent positives are indicated in the HPI above.  All other systems are negative.  Review of Systems   see  above  Vital Signs: BP 118/45 mmHg  Pulse 72  Temp(Src)  (Rectal)  Resp 17  Wt 133 lb 2.5 oz (60.4 kg)  SpO2 100%  Physical Exam pt intubated/sedated, unresponsive; rt glass eye; on CRRT via rt IJ cath; chest- dim BS bases; heart- brady with spiked T waves on EKG; abd- soft,+BS, intact rt ostomy; ext- LLE wound covered with tegaderm bandage; no sig edema  Imaging: Ct Abdomen Pelvis Wo Contrast  03/06/2014   CLINICAL DATA:  Patient unresponsive and intubated. Concern for sepsis with low body  temperature. History of ulcerative colitis.  EXAM: CT CHEST, ABDOMEN AND PELVIS WITHOUT CONTRAST  TECHNIQUE: Multidetector CT imaging of the chest, abdomen and pelvis was performed following the standard protocol without IV contrast.  COMPARISON:  Chest x-ray ant [phone]  FINDINGS: CT CHEST FINDINGS  Endotracheal tube extends into the mid trachea. A nasogastric tube extends into the stomach. There is no evidence of pneumothorax with lungs demonstrating mild COPD and bleb formation in the peripheral lungs bilaterally. Lower lung zones demonstrate atelectasis and probable component of bilateral lower lobe mucous plugging in the posterior lower lobe bronchi. No pleural fluid is identified.  The heart size is normal. No pericardial effusion. The thoracic aorta shows atherosclerotic plaque without aneurysmal disease. A tiny amount of calcified plaque is suspected in the distribution of the LAD and distal left circumflex coronary arteries. The thyroid gland is unremarkable. No evidence of pneumomediastinum or mediastinal mass. No lymphadenopathy identified. Bony structures are unremarkable.  CT ABDOMEN AND PELVIS FINDINGS  Acute cholecystitis is suspected with significant amount of fluid surrounding the gallbladder. Although the gallbladder is not overtly distended, there may be subtle underlying gallstones. Some free fluid tracks around the medial edge of the liver and extends up to the liver dome. There are inflammatory changes in the region of the gastrohepatic ligament and also in the left upper quadrant immediately inferior to the stomach. This appearance is nonspecific. Recommend correlation with laboratory evidence of pancreatitis. The pancreas itself appears fairly unremarkable by unenhanced CT.  There is evidence of an ileostomy in the right lower abdomen. The patient appears to be status post prior subtotal colectomy with only a rectum remaining. There is no evidence of bowel perforation or focal abscess. No  free intraperitoneal air identified.  Small bowel loops are of normal caliber. No masses or lymphadenopathy identified in the abdomen or pelvis. No hernias are seen. The bladder contains a Foley catheter with some increased density seen surrounding the Foley catheter balloon. This may be consistent with some hole line in the bladder. Correlation suggested with urinary tract. Underlying bladder lesion cannot be excluded by unenhanced CT.  IMPRESSION: 1. Chest demonstrates mucous plug formation and atelectasis in both posterior lower lobes. There is underlying COPD. No pneumothorax is identified. 2. Mild coronary atherosclerotic disease suspected with calcified plaque in the distal LAD and left circumflex territory. 3. Evidence of probable acute cholecystitis with free fluid surrounding the gallbladder. Cholelithiasis is suspected. Ultrasound correlation may be helpful. Some free fluid tracks around the liver. 4. Nonspecific inflammatory changes in the region of the gastrohepatic ligament and inferior to the stomach. Some of this may be postoperative in nature given evidence of prior subtotal colectomy. Recommend correlation with any laboratory evidence of pancreatitis. 5. No evidence of bowel perforation or obstruction. Status post subtotal colectomy. Right-sided ileostomy site appears uncomplicated. 6. High density in the bladder adjacent to the Foley catheter balloon. This may be reflective of blood given that IV  contrast was not administered. Underlying bladder lesion cannot be excluded.   Electronically Signed   By: Aletta Edouard M.D.   On: 03/06/2014 09:22   Ct Head Wo Contrast  03/06/2014   ADDENDUM REPORT: 03/06/2014 09:18  ADDENDUM: On axial slice 56 series 366, there is a 1.6 x 1.3 cm mass in the superficial soft tissues at the level of the angle of the mandible on the left. Suspect sebaceous cyst, although a focal lymph node could present in this manner.   Electronically Signed   By: Lowella Grip  III M.D.   On: 03/06/2014 09:18   03/06/2014   CLINICAL DATA:  Patient unresponsive  EXAM: CT HEAD WITHOUT CONTRAST  CT CERVICAL SPINE WITHOUT CONTRAST  TECHNIQUE: Multidetector CT imaging of the head and cervical spine was performed following the standard protocol without intravenous contrast. Multiplanar CT image reconstructions of the cervical spine were also generated.  COMPARISON:  None.  FINDINGS: CT HEAD FINDINGS  The ventricles are normal in size and configuration. There is no appreciable intracranial mass, hemorrhage, extra-axial fluid collection, or midline shift. There is a probable prominent prevascular space just lateral to the inferior aspect of the posterior limb of the left internal capsule. Gray-white compartments appear normal. No acute infarct apparent. Bony calvarium appears intact. The mastoid air cells are clear. The patient has had an antrostomy on the left. There is mucosal thickening in the inferior left maxillary antrum. There is a well-circumscribed rounded bony defect in the upper anterior hard palate. 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 There is evidence of previous surgery involving the anterior aspect of the right maxillary antrum wall.  CT CERVICAL SPINE FINDINGS  There is no fracture or spondylolisthesis. Prevertebral soft tissues and predental space regions are normal. Patient is intubated.  There is moderate disc space narrowing at C4-5, C5-6, and C6-7. There is mild narrowing at C3-4. There are central disc protrusions causing impression on the ventral cord at C3-4, C4-5, and C5-6. A smaller central disc protrusion is noted at C2-3. There is a degree of spinal stenosis due to the central disc protrusions at C3-4, C4-5, and C5-6. No erosive change or bony destruction.  IMPRESSION: CT head: No intracranial mass, hemorrhage, or extra-axial fluid collection. No focal gray-white compartment lesions/acute appearing infarct. Postoperative change in the face. Prosthetic right  globe. Well-circumscribed bony defect anterior hard palate. Etiology for this defect is uncertain. It should be amenable to visual inspection.  CT cervical spine: Multifocal osteoarthritic change. There is a degree of spinal stenosis due to central disc protrusion at C3-4, C4-5, and C5-6. No fracture or spondylolisthesis. No erosive change or bony destruction.  Electronically Signed: By: Lowella Grip III M.D. On: 03/06/2014 09:14   Ct Chest Wo Contrast  03/06/2014   CLINICAL DATA:  Patient unresponsive and intubated. Concern for sepsis with low body temperature. History of ulcerative colitis.  EXAM: CT CHEST, ABDOMEN AND PELVIS WITHOUT CONTRAST  TECHNIQUE: Multidetector CT imaging of the chest, abdomen and pelvis was performed following the standard protocol without IV contrast.  COMPARISON:  Chest x-ray ant [phone] hr  FINDINGS: CT CHEST FINDINGS  Endotracheal tube extends into the mid trachea. A nasogastric tube extends into the stomach. There is no evidence of pneumothorax with lungs demonstrating mild COPD and bleb formation in the peripheral lungs bilaterally. Lower lung zones demonstrate atelectasis and probable component of bilateral lower lobe mucous plugging in the posterior lower lobe bronchi. No pleural fluid is identified.  The heart  size is normal. No pericardial effusion. The thoracic aorta shows atherosclerotic plaque without aneurysmal disease. A tiny amount of calcified plaque is suspected in the distribution of the LAD and distal left circumflex coronary arteries. The thyroid gland is unremarkable. No evidence of pneumomediastinum or mediastinal mass. No lymphadenopathy identified. Bony structures are unremarkable.  CT ABDOMEN AND PELVIS FINDINGS  Acute cholecystitis is suspected with significant amount of fluid surrounding the gallbladder. Although the gallbladder is not overtly distended, there may be subtle underlying gallstones. Some free fluid tracks around the medial edge of the liver  and extends up to the liver dome. There are inflammatory changes in the region of the gastrohepatic ligament and also in the left upper quadrant immediately inferior to the stomach. This appearance is nonspecific. Recommend correlation with laboratory evidence of pancreatitis. The pancreas itself appears fairly unremarkable by unenhanced CT.  There is evidence of an ileostomy in the right lower abdomen. The patient appears to be status post prior subtotal colectomy with only a rectum remaining. There is no evidence of bowel perforation or focal abscess. No free intraperitoneal air identified.  Small bowel loops are of normal caliber. No masses or lymphadenopathy identified in the abdomen or pelvis. No hernias are seen. The bladder contains a Foley catheter with some increased density seen surrounding the Foley catheter balloon. This may be consistent with some hole line in the bladder. Correlation suggested with urinary tract. Underlying bladder lesion cannot be excluded by unenhanced CT.  IMPRESSION: 1. Chest demonstrates mucous plug formation and atelectasis in both posterior lower lobes. There is underlying COPD. No pneumothorax is identified. 2. Mild coronary atherosclerotic disease suspected with calcified plaque in the distal LAD and left circumflex territory. 3. Evidence of probable acute cholecystitis with free fluid surrounding the gallbladder. Cholelithiasis is suspected. Ultrasound correlation may be helpful. Some free fluid tracks around the liver. 4. Nonspecific inflammatory changes in the region of the gastrohepatic ligament and inferior to the stomach. Some of this may be postoperative in nature given evidence of prior subtotal colectomy. Recommend correlation with any laboratory evidence of pancreatitis. 5. No evidence of bowel perforation or obstruction. Status post subtotal colectomy. Right-sided ileostomy site appears uncomplicated. 6. High density in the bladder adjacent to the Foley catheter  balloon. This may be reflective of blood given that IV contrast was not administered. Underlying bladder lesion cannot be excluded.   Electronically Signed   By: Aletta Edouard M.D.   On: 03/06/2014 09:22   Ct Cervical Spine Wo Contrast  03/06/2014   ADDENDUM REPORT: 03/06/2014 09:18  ADDENDUM: On axial slice 56 series 782, there is a 1.6 x 1.3 cm mass in the superficial soft tissues at the level of the angle of the mandible on the left. Suspect sebaceous cyst, although a focal lymph node could present in this manner.   Electronically Signed   By: Lowella Grip III M.D.   On: 03/06/2014 09:18   03/06/2014   CLINICAL DATA:  Patient unresponsive  EXAM: CT HEAD WITHOUT CONTRAST  CT CERVICAL SPINE WITHOUT CONTRAST  TECHNIQUE: Multidetector CT imaging of the head and cervical spine was performed following the standard protocol without intravenous contrast. Multiplanar CT image reconstructions of the cervical spine were also generated.  COMPARISON:  None.  FINDINGS: CT HEAD FINDINGS  The ventricles are normal in size and configuration. There is no appreciable intracranial mass, hemorrhage, extra-axial fluid collection, or midline shift. There is a probable prominent prevascular space just lateral to the inferior aspect of the  posterior limb of the left internal capsule. Gray-white compartments appear normal. No acute infarct apparent. Bony calvarium appears intact. The mastoid air cells are clear. The patient has had an antrostomy on the left. There is mucosal thickening in the inferior left maxillary antrum. There is a well-circumscribed rounded bony defect in the upper anterior hard palate. 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 There is evidence of previous surgery involving the anterior aspect of the right maxillary antrum wall.  CT CERVICAL SPINE FINDINGS  There is no fracture or spondylolisthesis. Prevertebral soft tissues and predental space regions are normal. Patient is intubated.  There is moderate  disc space narrowing at C4-5, C5-6, and C6-7. There is mild narrowing at C3-4. There are central disc protrusions causing impression on the ventral cord at C3-4, C4-5, and C5-6. A smaller central disc protrusion is noted at C2-3. There is a degree of spinal stenosis due to the central disc protrusions at C3-4, C4-5, and C5-6. No erosive change or bony destruction.  IMPRESSION: CT head: No intracranial mass, hemorrhage, or extra-axial fluid collection. No focal gray-white compartment lesions/acute appearing infarct. Postoperative change in the face. Prosthetic right globe. Well-circumscribed bony defect anterior hard palate. Etiology for this defect is uncertain. It should be amenable to visual inspection.  CT cervical spine: Multifocal osteoarthritic change. There is a degree of spinal stenosis due to central disc protrusion at C3-4, C4-5, and C5-6. No fracture or spondylolisthesis. No erosive change or bony destruction.  Electronically Signed: By: Lowella Grip III M.D. On: 03/06/2014 09:14   Dg Chest Port 1 View  03/06/2014   CLINICAL DATA:  Central catheter placement  EXAM: PORTABLE CHEST - 1 VIEW  COMPARISON:  Chest radiograph and chest CT obtained earlier in the day  FINDINGS: Central catheter tip is in the superior vena cava. Endotracheal tube tip is 4.7 cm above the carina. Nasogastric tube tip and side port are in the stomach. No pneumothorax. There is patchy bibasilar atelectatic change. Elsewhere lungs are clear. Heart size and pulmonary vascularity are normal. No adenopathy.  IMPRESSION: Tube and catheter positions as described without pneumothorax. Patchy bibasilar atelectatic change. Lungs elsewhere clear. No change in cardiac silhouette.   Electronically Signed   By: Lowella Grip III M.D.   On: 03/06/2014 10:01   Dg Chest Portable 1 View  03/06/2014   CLINICAL DATA:  ET and OG tube placement.  EXAM: PORTABLE CHEST - 1 VIEW  COMPARISON:  None.  FINDINGS: Endotracheal tube is at the level  of the clavicular heads. Orogastric tube extends into the stomach. The lungs are expanded. There is a deep costophrenic angle on the right, suggesting a small pneumothorax but this is not confirmed by identification of the pleural line. No large pneumothorax is evident. No effusion is evident.  IMPRESSION: ET and OG tube as described.  Questionable small right pneumothorax.   Electronically Signed   By: Andreas Newport M.D.   On: 03/06/2014 06:44    Labs:  CBC:  Recent Labs  03/06/14 0554 03/06/14 0738  WBC 52.6* 49.0*  HGB 8.8* 7.0*  HCT 30.8* 23.1*  PLT 1036* 699*    COAGS:  Recent Labs  03/06/14 0554  INR 1.54*  APTT 42*    BMP:  Recent Labs  03/06/14 0554 03/06/14 0738  NA 128* 133*  K >7.5* 6.2*  CL 96 105  CO2 7* 9*  GLUCOSE 185* 439*  BUN 118* 105*  CALCIUM 10.7* 10.8*  CREATININE 8.59* 6.75*  GFRNONAA 6*  8*  GFRAA 7* 9*    LIVER FUNCTION TESTS:  Recent Labs  03/06/14 0554  BILITOT 0.3  AST 27  ALT 20  ALKPHOS 296*  PROT 7.6  ALBUMIN 2.6*    TUMOR MARKERS: No results for input(s): AFPTM, CEA, CA199, CHROMGRNA in the last 8760 hours. Assessment/Plan:  Gabriel Lynch is a 69 y.o. male, nursing home resident,  with history of DM,HTN, UC with prior colostomy, sacral wound, left LE brown recluse spider bite 06/2013 necessitating wound vac/hyperbaric treatment and recently treated UTI who is now admitted with increased lethargy, anemia, encephalopathy, resp/renal failure currently intubated/sedated and on CRRT, septic shock (WBC 49K/HGB 7.0) and imaging findings of acute cholecystitis with free fluid surrounding GB and tracking around liver. Request now received for percutaneous cholecystostomy.  Imaging studies have been reviewed by Dr. Annamaria Boots. Details/risks of procedure (including but not limited to internal bleeding, worsening infection/sepsis, and death) d/w pt's family with their understanding and consent. Paracentesis may be performed if needed.   Due to critical condition of pt may be necessary to perform procedure bedside once cleared by CCM.                                              Signed: Autumn Messing 03/06/2014, 11:37 AM   I spent a total of 20 minutes face to face in clinical consultation, greater than 50% of which was counseling/coordinating care for percutaneous cholecystostomy

## 2014-03-07 ENCOUNTER — Inpatient Hospital Stay (HOSPITAL_COMMUNITY): Payer: Medicare Other

## 2014-03-07 DIAGNOSIS — E872 Acidosis: Secondary | ICD-10-CM

## 2014-03-07 DIAGNOSIS — K810 Acute cholecystitis: Secondary | ICD-10-CM

## 2014-03-07 DIAGNOSIS — N179 Acute kidney failure, unspecified: Secondary | ICD-10-CM

## 2014-03-07 LAB — RENAL FUNCTION PANEL
ALBUMIN: 2 g/dL — AB (ref 3.5–5.2)
ALBUMIN: 2 g/dL — AB (ref 3.5–5.2)
ANION GAP: 25 — AB (ref 5–15)
Anion gap: 15 (ref 5–15)
BUN: 55 mg/dL — AB (ref 6–23)
BUN: 41 mg/dL — ABNORMAL HIGH (ref 6–23)
CO2: 19 mmol/L (ref 19–32)
CO2: 25 mmol/L (ref 19–32)
CREATININE: 3.66 mg/dL — AB (ref 0.50–1.35)
Calcium: 8 mg/dL — ABNORMAL LOW (ref 8.4–10.5)
Calcium: 7.3 mg/dL — ABNORMAL LOW (ref 8.4–10.5)
Chloride: 91 mmol/L — ABNORMAL LOW (ref 96–112)
Chloride: 93 mmol/L — ABNORMAL LOW (ref 96–112)
Creatinine, Ser: 2.75 mg/dL — ABNORMAL HIGH (ref 0.50–1.35)
GFR calc Af Amer: 18 mL/min — ABNORMAL LOW (ref >90)
GFR calc Af Amer: 26 mL/min — ABNORMAL LOW (ref >90)
GFR calc non Af Amer: 22 mL/min — ABNORMAL LOW (ref >90)
GFR, EST NON AFRICAN AMERICAN: 16 mL/min — AB (ref >90)
Glucose, Bld: 49 mg/dL — ABNORMAL LOW (ref 70–99)
Glucose, Bld: 116 mg/dL — ABNORMAL HIGH (ref 70–99)
PHOSPHORUS: 4.6 mg/dL (ref 2.3–4.6)
PHOSPHORUS: 3.5 mg/dL (ref 2.3–4.6)
Potassium: 4.2 mmol/L (ref 3.5–5.1)
Potassium: 4 mmol/L (ref 3.5–5.1)
SODIUM: 135 mmol/L (ref 135–145)
SODIUM: 133 mmol/L — AB (ref 135–145)

## 2014-03-07 LAB — CBC
HCT: 23.5 % — ABNORMAL LOW (ref 39.0–52.0)
HEMOGLOBIN: 7.6 g/dL — AB (ref 13.0–17.0)
MCH: 27.4 pg (ref 26.0–34.0)
MCHC: 32.3 g/dL (ref 30.0–36.0)
MCV: 84.8 fL (ref 78.0–100.0)
PLATELETS: 602 10*3/uL — AB (ref 150–400)
RBC: 2.77 MIL/uL — ABNORMAL LOW (ref 4.22–5.81)
RDW: 16.6 % — ABNORMAL HIGH (ref 11.5–15.5)
WBC: 37 10*3/uL — ABNORMAL HIGH (ref 4.0–10.5)

## 2014-03-07 LAB — BLOOD GAS, ARTERIAL
ACID-BASE DEFICIT: 7.8 mmol/L — AB (ref 0.0–2.0)
BICARBONATE: 15.9 meq/L — AB (ref 20.0–24.0)
FIO2: 0.3 %
O2 Saturation: 97.6 %
PATIENT TEMPERATURE: 98.6
PCO2 ART: 25.1 mmHg — AB (ref 35.0–45.0)
PEEP/CPAP: 5 cmH2O
RATE: 18 resp/min
TCO2: 16.6 mmol/L (ref 0–100)
VT: 600 mL
pH, Arterial: 7.416 (ref 7.350–7.450)
pO2, Arterial: 122 mmHg — ABNORMAL HIGH (ref 80.0–100.0)

## 2014-03-07 LAB — POCT ACTIVATED CLOTTING TIME
ACTIVATED CLOTTING TIME: 134 s
ACTIVATED CLOTTING TIME: 147 s
ACTIVATED CLOTTING TIME: 171 s
ACTIVATED CLOTTING TIME: 190 s
ACTIVATED CLOTTING TIME: 190 s
ACTIVATED CLOTTING TIME: 190 s
ACTIVATED CLOTTING TIME: 202 s
Activated Clotting Time: 140 seconds
Activated Clotting Time: 128 seconds
Activated Clotting Time: 140 seconds
Activated Clotting Time: 159 seconds
Activated Clotting Time: 146 seconds
Activated Clotting Time: 208 seconds
Activated Clotting Time: 190 seconds
Activated Clotting Time: 165 seconds
Activated Clotting Time: 171 seconds
Activated Clotting Time: 165 seconds
Activated Clotting Time: 165 seconds
Activated Clotting Time: 171 seconds

## 2014-03-07 LAB — GLUCOSE, CAPILLARY
GLUCOSE-CAPILLARY: 107 mg/dL — AB (ref 70–99)
GLUCOSE-CAPILLARY: 44 mg/dL — AB (ref 70–99)
GLUCOSE-CAPILLARY: 192 mg/dL — AB (ref 70–99)
GLUCOSE-CAPILLARY: 120 mg/dL — AB (ref 70–99)
GLUCOSE-CAPILLARY: 118 mg/dL — AB (ref 70–99)
Glucose-Capillary: 105 mg/dL — ABNORMAL HIGH (ref 70–99)
Glucose-Capillary: 47 mg/dL — ABNORMAL LOW (ref 70–99)
Glucose-Capillary: 84 mg/dL (ref 70–99)
Glucose-Capillary: 118 mg/dL — ABNORMAL HIGH (ref 70–99)

## 2014-03-07 LAB — APTT: aPTT: >200 seconds (ref 24–37)

## 2014-03-07 LAB — LACTIC ACID, PLASMA: LACTIC ACID, VENOUS: 9.3 mmol/L — AB (ref 0.5–2.0)

## 2014-03-07 LAB — PHOSPHORUS: PHOSPHORUS: 4.4 mg/dL (ref 2.3–4.6)

## 2014-03-07 LAB — MAGNESIUM: Magnesium: 2.1 mg/dL (ref 1.5–2.5)

## 2014-03-07 LAB — T4, FREE: Free T4: 0.66 ng/dL — ABNORMAL LOW (ref 0.80–1.80)

## 2014-03-07 LAB — LIPASE, BLOOD: LIPASE: 193 U/L — AB (ref 11–59)

## 2014-03-07 MED ORDER — WHITE PETROLATUM GEL
Status: AC
Start: 2014-03-07 — End: 2014-03-07
  Administered 2014-03-07: 0.2
  Filled 2014-03-07: qty 1

## 2014-03-07 MED ORDER — DEXTROSE 50 % IV SOLN
INTRAVENOUS | Status: AC
  Filled 2014-03-07: qty 50

## 2014-03-07 MED ORDER — SODIUM CHLORIDE 0.9 % IV BOLUS (SEPSIS): 500.0000 mL | Freq: Once | INTRAVENOUS | Status: DC

## 2014-03-07 MED ORDER — PRISMASOL BGK 4/2.5 32-4-2.5 MEQ/L IV SOLN
INTRAVENOUS | Status: DC
  Administered 2014-03-07 – 2014-03-08 (×3): via INTRAVENOUS_CENTRAL
  Filled 2014-03-07 (×4): qty 5000

## 2014-03-07 MED ORDER — SODIUM CHLORIDE 0.9 % IV SOLN
INTRAVENOUS | Status: AC
  Administered 2014-03-07: 08:00:00 via INTRAVENOUS

## 2014-03-07 MED ORDER — SODIUM CHLORIDE 0.9 % IV SOLN
25.0000 ug/h | INTRAVENOUS | Status: DC
  Administered 2014-03-07: 50 ug/h via INTRAVENOUS
  Filled 2014-03-07: qty 50

## 2014-03-07 MED ORDER — METRONIDAZOLE IN NACL 5-0.79 MG/ML-% IV SOLN
500.0000 mg | Freq: Three times a day (TID) | INTRAVENOUS | Status: DC
  Administered 2014-03-07 – 2014-03-09 (×6): 500 mg via INTRAVENOUS
  Filled 2014-03-07 (×8): qty 100

## 2014-03-07 MED ORDER — DEXTROSE 50 % IV SOLN
50.0000 mL | Freq: Once | INTRAVENOUS | Status: AC
  Administered 2014-03-07: 50 mL via INTRAVENOUS

## 2014-03-07 MED ORDER — PRISMASOL BGK 4/2.5 32-4-2.5 MEQ/L IV SOLN
INTRAVENOUS | Status: DC
  Administered 2014-03-07 – 2014-03-08 (×2): via INTRAVENOUS_CENTRAL
  Filled 2014-03-07 (×3): qty 5000

## 2014-03-07 MED ORDER — STERILE WATER FOR INJECTION IV SOLN
INTRAVENOUS | Status: DC
  Administered 2014-03-07: 17:00:00 via INTRAVENOUS_CENTRAL
  Filled 2014-03-07 (×4): qty 150

## 2014-03-07 NOTE — Progress Notes (Signed)
Hypoglycemic Event  CBG: cbg-47  Treatment: D50 IV 50 mL  Symptoms: None  Follow-up CBG: [id number] CBG Result:[id number]-192  Possible Reasons for Event: Inadequate meal intake  Comments/MD notified:per protocol    Dillard Essex  Remember to initiate Hypoglycemia Order Set & complete

## 2014-03-07 NOTE — Progress Notes (Signed)
Subjective:  Events noted- diagnosed with cholecystitis- s/p perc cholecystostomy tube- has actually made pretty good strides overnight- off pressors/acidosis improved- minimal UOP still   Objective Vital signs in last 24 hours: Filed Vitals:   03/07/14 0430 03/07/14 0500 03/07/14 0600 03/07/14 0700  BP: 153/65 104/63 117/56 108/63  Pulse: 116   87  Temp: 97.2 F (36.2 C) 97.4 F (36.3 C) 96.7 F (35.9 C) 96.3 F (35.7 C)  TempSrc:      Resp: 23 18 18 18   Height:      Weight:      SpO2: 97% 100% 100% 100%   Weight change: -2.65 kg (-5 lb 13.5 oz)  Intake/Output Summary (Last 24 hours) at 03/07/14 4562 Last data filed at 03/07/14 0700  Gross per 24 hour  Intake 2736.34 ml  Output   1726 ml  Net 1010.34 ml    Assessment/ Plan: Pt is a 69 y.o. yo male NH resident with ulcerative colitis/colostomy and wound vac to deep sacral decub who was admitted on 03/06/2014 with  Septic shock- cholecystitis and AKI  Assessment/Plan: 1. Renal- AKI- oliguric- due to septic shock/ATN- requiring CRRT - will change fluid pre filter to dialysate but continue post filter bicarb for now but may be able to stop soon- hyperkalemia resolved and metabolic acidosis much better 2. Sepsis- appears to be due to cholecystitis- s/p chole tube and maxipime/zosyn and vanc 3. Anemia- consider transfusion- per CCM 4. HTN/volume- actually feel like is a little dry- will run some IVF outside of CRRT  Orian Amberg A    Labs: Basic Metabolic Panel:  Recent Labs Lab 03/06/14 1500 03/06/14 2220 03/07/14 0404  NA 133* 132* 135  K 5.5* 4.2 4.2  CL 96 90* 91*  CO2 13* 19 19  GLUCOSE 236* 67* 49*  BUN 94* 70* 55*  CREATININE 6.07* 4.39* 3.66*  CALCIUM 9.8 8.6 8.0*  PHOS 7.6*  --  4.4  4.6   Liver Function Tests:  Recent Labs Lab 03/06/14 0554 03/06/14 1500 03/07/14 0404  AST 27  --   --   ALT 20  --   --   ALKPHOS 296*  --   --   BILITOT 0.3  --   --   PROT 7.6  --   --   ALBUMIN 2.6*  2.0* 2.0*   No results for input(s): LIPASE, AMYLASE in the last 168 hours. No results for input(s): AMMONIA in the last 168 hours. CBC:  Recent Labs Lab 03/06/14 0554 03/06/14 0738 03/06/14 1157 03/07/14 0404  WBC 52.6* 49.0*  --  37.0*  NEUTROABS 46.3*  --   --   --   HGB 8.8* 7.0* 8.5* 7.6*  HCT 30.8* 23.1* 25.0* 23.5*  MCV 95.4 91.7  --  84.8  PLT 1036* 699*  --  602*   Cardiac Enzymes:  Recent Labs Lab 03/06/14 0738 03/06/14 1339 03/06/14 2220  TROPONINI <0.03 0.05* 0.05*   CBG:  Recent Labs Lab 03/06/14 2315 03/06/14 2319 03/07/14 03/07/14 0353 03/07/14 0417  GLUCAP 38* 107* 105* 47* 192*    Iron Studies: No results for input(s): IRON, TIBC, TRANSFERRIN, FERRITIN in the last 72 hours. Studies/Results: Ct Abdomen Pelvis Wo Contrast  03/06/2014   CLINICAL DATA:  Patient unresponsive and intubated. Concern for sepsis with low body temperature. History of ulcerative colitis.  EXAM: CT CHEST, ABDOMEN AND PELVIS WITHOUT CONTRAST  TECHNIQUE: Multidetector CT imaging of the chest, abdomen and pelvis was performed following the standard protocol without IV contrast.  COMPARISON:  Chest x-ray ant [phone]  FINDINGS: CT CHEST FINDINGS  Endotracheal tube extends into the mid trachea. A nasogastric tube extends into the stomach. There is no evidence of pneumothorax with lungs demonstrating mild COPD and bleb formation in the peripheral lungs bilaterally. Lower lung zones demonstrate atelectasis and probable component of bilateral lower lobe mucous plugging in the posterior lower lobe bronchi. No pleural fluid is identified.  The heart size is normal. No pericardial effusion. The thoracic aorta shows atherosclerotic plaque without aneurysmal disease. A tiny amount of calcified plaque is suspected in the distribution of the LAD and distal left circumflex coronary arteries. The thyroid gland is unremarkable. No evidence of pneumomediastinum or mediastinal mass. No lymphadenopathy  identified. Bony structures are unremarkable.  CT ABDOMEN AND PELVIS FINDINGS  Acute cholecystitis is suspected with significant amount of fluid surrounding the gallbladder. Although the gallbladder is not overtly distended, there may be subtle underlying gallstones. Some free fluid tracks around the medial edge of the liver and extends up to the liver dome. There are inflammatory changes in the region of the gastrohepatic ligament and also in the left upper quadrant immediately inferior to the stomach. This appearance is nonspecific. Recommend correlation with laboratory evidence of pancreatitis. The pancreas itself appears fairly unremarkable by unenhanced CT.  There is evidence of an ileostomy in the right lower abdomen. The patient appears to be status post prior subtotal colectomy with only a rectum remaining. There is no evidence of bowel perforation or focal abscess. No free intraperitoneal air identified.  Small bowel loops are of normal caliber. No masses or lymphadenopathy identified in the abdomen or pelvis. No hernias are seen. The bladder contains a Foley catheter with some increased density seen surrounding the Foley catheter balloon. This may be consistent with some hole line in the bladder. Correlation suggested with urinary tract. Underlying bladder lesion cannot be excluded by unenhanced CT.  IMPRESSION: 1. Chest demonstrates mucous plug formation and atelectasis in both posterior lower lobes. There is underlying COPD. No pneumothorax is identified. 2. Mild coronary atherosclerotic disease suspected with calcified plaque in the distal LAD and left circumflex territory. 3. Evidence of probable acute cholecystitis with free fluid surrounding the gallbladder. Cholelithiasis is suspected. Ultrasound correlation may be helpful. Some free fluid tracks around the liver. 4. Nonspecific inflammatory changes in the region of the gastrohepatic ligament and inferior to the stomach. Some of this may be  postoperative in nature given evidence of prior subtotal colectomy. Recommend correlation with any laboratory evidence of pancreatitis. 5. No evidence of bowel perforation or obstruction. Status post subtotal colectomy. Right-sided ileostomy site appears uncomplicated. 6. High density in the bladder adjacent to the Foley catheter balloon. This may be reflective of blood given that IV contrast was not administered. Underlying bladder lesion cannot be excluded.   Electronically Signed   By: Aletta Edouard M.D.   On: 03/06/2014 09:22   Ct Head Wo Contrast  03/06/2014   ADDENDUM REPORT: 03/06/2014 09:18  ADDENDUM: On axial slice 56 series 349, there is a 1.6 x 1.3 cm mass in the superficial soft tissues at the level of the angle of the mandible on the left. Suspect sebaceous cyst, although a focal lymph node could present in this manner.   Electronically Signed   By: Lowella Grip III M.D.   On: 03/06/2014 09:18   03/06/2014   CLINICAL DATA:  Patient unresponsive  EXAM: CT HEAD WITHOUT CONTRAST  CT CERVICAL SPINE WITHOUT CONTRAST  TECHNIQUE: Multidetector CT  imaging of the head and cervical spine was performed following the standard protocol without intravenous contrast. Multiplanar CT image reconstructions of the cervical spine were also generated.  COMPARISON:  None.  FINDINGS: CT HEAD FINDINGS  The ventricles are normal in size and configuration. There is no appreciable intracranial mass, hemorrhage, extra-axial fluid collection, or midline shift. There is a probable prominent prevascular space just lateral to the inferior aspect of the posterior limb of the left internal capsule. Gray-white compartments appear normal. No acute infarct apparent. Bony calvarium appears intact. The mastoid air cells are clear. The patient has had an antrostomy on the left. There is mucosal thickening in the inferior left maxillary antrum. There is a well-circumscribed rounded bony defect in the upper anterior hard palate.  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 There is evidence of previous surgery involving the anterior aspect of the right maxillary antrum wall.  CT CERVICAL SPINE FINDINGS  There is no fracture or spondylolisthesis. Prevertebral soft tissues and predental space regions are normal. Patient is intubated.  There is moderate disc space narrowing at C4-5, C5-6, and C6-7. There is mild narrowing at C3-4. There are central disc protrusions causing impression on the ventral cord at C3-4, C4-5, and C5-6. A smaller central disc protrusion is noted at C2-3. There is a degree of spinal stenosis due to the central disc protrusions at C3-4, C4-5, and C5-6. No erosive change or bony destruction.  IMPRESSION: CT head: No intracranial mass, hemorrhage, or extra-axial fluid collection. No focal gray-white compartment lesions/acute appearing infarct. Postoperative change in the face. Prosthetic right globe. Well-circumscribed bony defect anterior hard palate. Etiology for this defect is uncertain. It should be amenable to visual inspection.  CT cervical spine: Multifocal osteoarthritic change. There is a degree of spinal stenosis due to central disc protrusion at C3-4, C4-5, and C5-6. No fracture or spondylolisthesis. No erosive change or bony destruction.  Electronically Signed: By: Lowella Grip III M.D. On: 03/06/2014 09:14   Ct Chest Wo Contrast  03/06/2014   CLINICAL DATA:  Patient unresponsive and intubated. Concern for sepsis with low body temperature. History of ulcerative colitis.  EXAM: CT CHEST, ABDOMEN AND PELVIS WITHOUT CONTRAST  TECHNIQUE: Multidetector CT imaging of the chest, abdomen and pelvis was performed following the standard protocol without IV contrast.  COMPARISON:  Chest x-ray ant [phone]  FINDINGS: CT CHEST FINDINGS  Endotracheal tube extends into the mid trachea. A nasogastric tube extends into the stomach. There is no evidence of pneumothorax with lungs demonstrating mild COPD and bleb formation in  the peripheral lungs bilaterally. Lower lung zones demonstrate atelectasis and probable component of bilateral lower lobe mucous plugging in the posterior lower lobe bronchi. No pleural fluid is identified.  The heart size is normal. No pericardial effusion. The thoracic aorta shows atherosclerotic plaque without aneurysmal disease. A tiny amount of calcified plaque is suspected in the distribution of the LAD and distal left circumflex coronary arteries. The thyroid gland is unremarkable. No evidence of pneumomediastinum or mediastinal mass. No lymphadenopathy identified. Bony structures are unremarkable.  CT ABDOMEN AND PELVIS FINDINGS  Acute cholecystitis is suspected with significant amount of fluid surrounding the gallbladder. Although the gallbladder is not overtly distended, there may be subtle underlying gallstones. Some free fluid tracks around the medial edge of the liver and extends up to the liver dome. There are inflammatory changes in the region of the gastrohepatic ligament and also in the left upper quadrant immediately inferior to the stomach. This appearance  is nonspecific. Recommend correlation with laboratory evidence of pancreatitis. The pancreas itself appears fairly unremarkable by unenhanced CT.  There is evidence of an ileostomy in the right lower abdomen. The patient appears to be status post prior subtotal colectomy with only a rectum remaining. There is no evidence of bowel perforation or focal abscess. No free intraperitoneal air identified.  Small bowel loops are of normal caliber. No masses or lymphadenopathy identified in the abdomen or pelvis. No hernias are seen. The bladder contains a Foley catheter with some increased density seen surrounding the Foley catheter balloon. This may be consistent with some hole line in the bladder. Correlation suggested with urinary tract. Underlying bladder lesion cannot be excluded by unenhanced CT.  IMPRESSION: 1. Chest demonstrates mucous plug  formation and atelectasis in both posterior lower lobes. There is underlying COPD. No pneumothorax is identified. 2. Mild coronary atherosclerotic disease suspected with calcified plaque in the distal LAD and left circumflex territory. 3. Evidence of probable acute cholecystitis with free fluid surrounding the gallbladder. Cholelithiasis is suspected. Ultrasound correlation may be helpful. Some free fluid tracks around the liver. 4. Nonspecific inflammatory changes in the region of the gastrohepatic ligament and inferior to the stomach. Some of this may be postoperative in nature given evidence of prior subtotal colectomy. Recommend correlation with any laboratory evidence of pancreatitis. 5. No evidence of bowel perforation or obstruction. Status post subtotal colectomy. Right-sided ileostomy site appears uncomplicated. 6. High density in the bladder adjacent to the Foley catheter balloon. This may be reflective of blood given that IV contrast was not administered. Underlying bladder lesion cannot be excluded.   Electronically Signed   By: Aletta Edouard M.D.   On: 03/06/2014 09:22   Ct Cervical Spine Wo Contrast  03/06/2014   ADDENDUM REPORT: 03/06/2014 09:18  ADDENDUM: On axial slice 56 series 161, there is a 1.6 x 1.3 cm mass in the superficial soft tissues at the level of the angle of the mandible on the left. Suspect sebaceous cyst, although a focal lymph node could present in this manner.   Electronically Signed   By: Lowella Grip III M.D.   On: 03/06/2014 09:18   03/06/2014   CLINICAL DATA:  Patient unresponsive  EXAM: CT HEAD WITHOUT CONTRAST  CT CERVICAL SPINE WITHOUT CONTRAST  TECHNIQUE: Multidetector CT imaging of the head and cervical spine was performed following the standard protocol without intravenous contrast. Multiplanar CT image reconstructions of the cervical spine were also generated.  COMPARISON:  None.  FINDINGS: CT HEAD FINDINGS  The ventricles are normal in size and configuration.  There is no appreciable intracranial mass, hemorrhage, extra-axial fluid collection, or midline shift. There is a probable prominent prevascular space just lateral to the inferior aspect of the posterior limb of the left internal capsule. Gray-white compartments appear normal. No acute infarct apparent. Bony calvarium appears intact. The mastoid air cells are clear. The patient has had an antrostomy on the left. There is mucosal thickening in the inferior left maxillary antrum. There is a well-circumscribed rounded bony defect in the upper anterior hard palate. 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 There is evidence of previous surgery involving the anterior aspect of the right maxillary antrum wall.  CT CERVICAL SPINE FINDINGS  There is no fracture or spondylolisthesis. Prevertebral soft tissues and predental space regions are normal. Patient is intubated.  There is moderate disc space narrowing at C4-5, C5-6, and C6-7. There is mild narrowing at C3-4. There are central disc protrusions causing  impression on the ventral cord at C3-4, C4-5, and C5-6. A smaller central disc protrusion is noted at C2-3. There is a degree of spinal stenosis due to the central disc protrusions at C3-4, C4-5, and C5-6. No erosive change or bony destruction.  IMPRESSION: CT head: No intracranial mass, hemorrhage, or extra-axial fluid collection. No focal gray-white compartment lesions/acute appearing infarct. Postoperative change in the face. Prosthetic right globe. Well-circumscribed bony defect anterior hard palate. Etiology for this defect is uncertain. It should be amenable to visual inspection.  CT cervical spine: Multifocal osteoarthritic change. There is a degree of spinal stenosis due to central disc protrusion at C3-4, C4-5, and C5-6. No fracture or spondylolisthesis. No erosive change or bony destruction.  Electronically Signed: By: Lowella Grip III M.D. On: 03/06/2014 09:14   Ir Perc Cholecystostomy  03/06/2014    CLINICAL DATA:  Acute calculus cholecystitis  EXAM: Ultrasound percutaneous transhepatic cholecystostomy (portable)  Date:  2/19/20162/19/2016 1:52 pm  Radiologist:  Jerilynn Mages. Daryll Brod, MD  Guidance:  Ultrasound  FLUOROSCOPY TIME:  None.  MEDICATIONS AND MEDICAL HISTORY: 1% lidocaine locally. Patient is already sedated and receiving broad spectrum IV antibiotics daily.  ANESTHESIA/SEDATION: None.  CONTRAST:  None.  COMPLICATIONS: None immediate  PROCEDURE: Informed consent was obtained from the patient following explanation of the procedure, risks, benefits and alternatives. The patient understands, agrees and consents for the procedure. All questions were addressed. A time out was performed.  Maximal barrier sterile technique utilized including caps, mask, sterile gowns, sterile gloves, large sterile drape, hand hygiene, and Betadine.  Previous imaging reviewed.  At the bedside, ultrasound was performed of the right upper quadrant. The gallbladder was localized just inferior to the right subcostal margin. There is diffuse wall thickening and edema. Gallbladder is not significantly distended. Large gallstone noted. Under sterile conditions and local anesthesia, a 21 gauge needle was advanced percutaneously through a transhepatic windowed into the gallbladder. Needle position confirmed with ultrasound. Images obtained for documentation. Guidewire advanced into the gallbladder. Guidewire position confirmed with ultrasound. Accustick dilator set advanced. Guidewire exchanged for an Amplatz guidewire. Guidewire again confirmed within the gallbladder with ultrasound. Tract dilatation performed to advance a 10 [place] drain. Retention loop formed in the gallbladder. Syringe aspiration yielded thick bile. Sample sent for Gram stain culture. Catheter secured with a Prolene suture and connected to external gravity drainage. Sterile dressing applied. No Immediate complication. Patient tolerated the procedure well.  IMPRESSION:  Successful ultrasound percutaneous transhepatic cholecystostomy.   Electronically Signed   By: Jerilynn Mages.  Shick M.D.   On: 03/06/2014 14:13   Dg Chest Port 1 View  03/06/2014   CLINICAL DATA:  Central catheter placement  EXAM: PORTABLE CHEST - 1 VIEW  COMPARISON:  Chest radiograph and chest CT obtained earlier in the day  FINDINGS: Central catheter tip is in the superior vena cava. Endotracheal tube tip is 4.7 cm above the carina. Nasogastric tube tip and side port are in the stomach. No pneumothorax. There is patchy bibasilar atelectatic change. Elsewhere lungs are clear. Heart size and pulmonary vascularity are normal. No adenopathy.  IMPRESSION: Tube and catheter positions as described without pneumothorax. Patchy bibasilar atelectatic change. Lungs elsewhere clear. No change in cardiac silhouette.   Electronically Signed   By: Lowella Grip III M.D.   On: 03/06/2014 10:01   Dg Chest Portable 1 View  03/06/2014   CLINICAL DATA:  ET and OG tube placement.  EXAM: PORTABLE CHEST - 1 VIEW  COMPARISON:  None.  FINDINGS: Endotracheal tube is  at the level of the clavicular heads. Orogastric tube extends into the stomach. The lungs are expanded. There is a deep costophrenic angle on the right, suggesting a small pneumothorax but this is not confirmed by identification of the pleural line. No large pneumothorax is evident. No effusion is evident.  IMPRESSION: ET and OG tube as described.  Questionable small right pneumothorax.   Electronically Signed   By: Andreas Newport M.D.   On: 03/06/2014 06:44   Medications: Infusions: . sodium chloride    . sodium chloride    . fentaNYL infusion INTRAVENOUS 150 mcg/hr (03/07/14 0537)  . heparin 10,000 units/ 20 mL infusion syringe 1,650 Units/hr (03/07/14 0313)  . insulin (NOVOLIN-R) infusion Stopped (03/06/14 1800)  . norepinephrine (LEVOPHED) Adult infusion Stopped (03/07/14 0100)  . dialysate (PRISMASATE) 2,000 mL/hr at 03/07/14 0446  . sodium bicarbonate  (isotonic) 1000 mL infusion 125 mL/hr at 03/07/14 0223  . sodium bicarbonate (isotonic) 1000 mL infusion 200 mL/hr at 03/07/14 0239  . vasopressin (PITRESSIN) infusion - *FOR SHOCK* Stopped (03/06/14 2340)    Scheduled Medications: . antiseptic oral rinse  7 mL Mouth Rinse QID  . ceFEPime (MAXIPIME) IV  2 g Intravenous Q12H  . chlorhexidine  15 mL Mouth Rinse BID  . Chlorhexidine Gluconate Cloth  6 each Topical Q0600  . heparin  5,000 Units Subcutaneous 3 times per day  . insulin aspart  0-15 Units Subcutaneous 6 times per day  . mupirocin ointment  1 application Nasal BID  . pantoprazole (PROTONIX) IV  40 mg Intravenous QHS  . sodium chloride  1,000 mL Intravenous Once  . vancomycin  750 mg Intravenous Q24H    have reviewed scheduled and prn medications.  Physical Exam: General:more alert but no commands Heart: RRR Lungs: mostly clear Abdomen: distended- chole tube in place Extremities: no edema--- reported sacral decub to the bone Dialysis Access: vascath placed 2/19    03/07/2014,7:12 AM  LOS: 1 day

## 2014-03-07 NOTE — Procedures (Signed)
Extubation Procedure Note  Patient Details:   Name: Gabriel Lynch DOB: April 14, 1945 MRN: [id number]   Airway Documentation:  Airway 8 mm (Active)  Secured at (cm) 25 cm 03/07/2014  8:09 AM  Measured From Lips 03/07/2014  8:09 AM  Secured Location Left 03/07/2014  8:09 AM  Secured By [employer] 03/07/2014  8:09 AM  Tube Holder Repositioned Yes 03/07/2014  8:09 AM  Site Condition Dry 03/07/2014  8:09 AM    Evaluation  O2 sats: stable throughout Complications: No apparent complications Patient did tolerate procedure well. Bilateral Breath Sounds: Clear, Diminished Suctioning: Airway No  Pt was extubated per MD order to 2 LPM nasal cannula. Pt had positive cuff leak. Pt was not able to speak name or give a strong cough. Pt had weak cough and had small oral secretions. BBS were Rhonchi, no stridor present. Vitals are stable. SPO2 100%. RT will continue to monitor.   Yvetta Drotar M 03/07/2014, 10:22 AM

## 2014-03-07 NOTE — Progress Notes (Signed)
SLP Cancellation Note  Patient Details Name: Deon Duer MRN: [id number] DOB: [birth year]   Cancelled treatment:       Reason Eval/Treat Not Completed: Patient was extubated about 4 hours previously and per nurse is not appropriate for BSE today.  ST will follow up tomorrow.     Lamar Sprinkles 03/07/2014, 2:30 PM

## 2014-03-07 NOTE — Progress Notes (Signed)
CRITICAL VALUE ALERT  Critical value received:  PTT>200  Date of notification:  03/07/2014  Time of notification:  5:34 AM  Critical value read back:Yes.    Nurse who received alert: Dillard Essex  MD notified (1st page):  Dr. Tamala Julian  Time of first page:  5:35 AM  MD notified (2nd page):  Time of second page:  Responding MD:  Dr. Tamala Julian  Time MD responded:  5:35 AM

## 2014-03-07 NOTE — Progress Notes (Signed)
PULMONARY / CRITICAL CARE MEDICINE   Name: Gabriel Lynch MRN: [id number] DOB: [birth year]    ADMISSION DATE:  03/06/2014  REFERRING MD :  EDP  CHIEF COMPLAINT:  Brought in unresponsive  INITIAL PRESENTATION: 69 year old, nursing home resident, with ulcerative colitis/colostomy and wound VAC to sacral wound, BIBEMS unresponsive, with septic shock and AKI  STUDIES:  CT abdomen 2/19  SIGNIFICANT EVENTS: 2/19 ETT >>   SUBJECTIVE:  Levophed gtt and vasopressin gtt off Fentanyl for sedation On CRRT S/p percutaneous cholecystostomy Hypoglycemic to 44 overnight while on insulin drip  VITAL SIGNS: Temp:  [85.9 F (29.9 C)-98.6 F (37 C)] 96.3 F (35.7 C) (02/20 0700) Pulse Rate:  [25-150] 87 (02/20 0700) Resp:  [13-26] 18 (02/20 0700) BP: (89-161)/(39-102) 108/63 mmHg (02/20 0700) SpO2:  [97 %-100 %] 100 % (02/20 0700) FiO2 (%):  [30 %-40 %] 30 % (02/20 0400) Weight:  [133 lb 2.5 oz (60.4 kg)-143 lb 11.8 oz (65.2 kg)] 143 lb 11.8 oz (65.2 kg) (02/20 0400) HEMODYNAMICS:   VENTILATOR SETTINGS: Vent Mode:  [-] PRVC FiO2 (%):  [30 %-40 %] 30 % Set Rate:  [18 bmp] 18 bmp Vt Set:  [600 mL] 600 mL PEEP:  [5 cmH20] 5 cmH20 Plateau Pressure:  [11 cmH20-22 cmH20] 22 cmH20 INTAKE / OUTPUT:  Intake/Output Summary (Last 24 hours) at 03/07/14 6644 Last data filed at 03/07/14 0700  Gross per 24 hour  Intake 2736.34 ml  Output   1726 ml  Net 1010.34 ml    PHYSICAL EXAMINATION: General:  Chronically ill-appearing , intubated , sedated  Neuro:  Alert, responds to commands HEENT:  Right glass eye , left pupil 3 mm, no JVD  Cardiovascular:  S1-S2 regular rate and rhythm Lungs:  Clear to auscultation bilaterally Abdomen:  Soft , colostomy with minimal dark brown output Musculoskeletal:  Left shin with wound bandages Skin:  Flaky scaling over both feet  LABS:  CBC  Recent Labs Lab 03/06/14 0554 03/06/14 0738 03/06/14 1157 03/07/14 0404  WBC 52.6* 49.0*  --  37.0*  HGB  8.8* 7.0* 8.5* 7.6*  HCT 30.8* 23.1* 25.0* 23.5*  PLT 1036* 699*  --  602*   Coag's  Recent Labs Lab 03/06/14 0554 03/07/14 0404  APTT 42* >200*  INR 1.54*  --    BMET  Recent Labs Lab 03/06/14 1500 03/06/14 2220 03/07/14 0404  NA 133* 132* 135  K 5.5* 4.2 4.2  CL 96 90* 91*  CO2 13* 19 19  BUN 94* 70* 55*  CREATININE 6.07* 4.39* 3.66*  GLUCOSE 236* 67* 49*   Electrolytes  Recent Labs Lab 03/06/14 1500 03/06/14 2220 03/07/14 0404  CALCIUM 9.8 8.6 8.0*  MG  --   --  2.1  PHOS 7.6*  --  4.4  4.6   Sepsis Markers  Recent Labs Lab 03/06/14 0739 03/06/14 0744 03/06/14 0800 03/06/14 1340  LATICACIDVEN 8.6* 10.70*  --  8.9*  PROCALCITON  --   --  8.31  --    ABG  Recent Labs Lab 03/06/14 1157 03/06/14 1635 03/07/14 0310  PHART 7.243* 7.268* 7.416  PCO2ART 21.8* 26.2* 25.1*  PO2ART 68.0* 113.0* 122.0*   Liver Enzymes  Recent Labs Lab 03/06/14 0554 03/06/14 1500 03/07/14 0404  AST 27  --   --   ALT 20  --   --   ALKPHOS 296*  --   --   BILITOT 0.3  --   --   ALBUMIN 2.6* 2.0* 2.0*   Cardiac Enzymes  Recent  Labs Lab 03/06/14 0738 03/06/14 1339 03/06/14 2220  TROPONINI <0.03 0.05* 0.05*   Glucose  Recent Labs Lab 03/06/14 2253 03/06/14 2315 03/06/14 2319 03/07/14 03/07/14 0353 03/07/14 0417  GLUCAP 44* 38* 107* 105* 47* 192*    Imaging Ct Abdomen Pelvis Wo Contrast  03/06/2014   CLINICAL DATA:  Patient unresponsive and intubated. Concern for sepsis with low body temperature. History of ulcerative colitis.  EXAM: CT CHEST, ABDOMEN AND PELVIS WITHOUT CONTRAST  TECHNIQUE: Multidetector CT imaging of the chest, abdomen and pelvis was performed following the standard protocol without IV contrast.  COMPARISON:  Chest x-ray ant [phone] hr  FINDINGS: CT CHEST FINDINGS  Endotracheal tube extends into the mid trachea. A nasogastric tube extends into the stomach. There is no evidence of pneumothorax with lungs demonstrating mild COPD and bleb  formation in the peripheral lungs bilaterally. Lower lung zones demonstrate atelectasis and probable component of bilateral lower lobe mucous plugging in the posterior lower lobe bronchi. No pleural fluid is identified.  The heart size is normal. No pericardial effusion. The thoracic aorta shows atherosclerotic plaque without aneurysmal disease. A tiny amount of calcified plaque is suspected in the distribution of the LAD and distal left circumflex coronary arteries. The thyroid gland is unremarkable. No evidence of pneumomediastinum or mediastinal mass. No lymphadenopathy identified. Bony structures are unremarkable.  CT ABDOMEN AND PELVIS FINDINGS  Acute cholecystitis is suspected with significant amount of fluid surrounding the gallbladder. Although the gallbladder is not overtly distended, there may be subtle underlying gallstones. Some free fluid tracks around the medial edge of the liver and extends up to the liver dome. There are inflammatory changes in the region of the gastrohepatic ligament and also in the left upper quadrant immediately inferior to the stomach. This appearance is nonspecific. Recommend correlation with laboratory evidence of pancreatitis. The pancreas itself appears fairly unremarkable by unenhanced CT.  There is evidence of an ileostomy in the right lower abdomen. The patient appears to be status post prior subtotal colectomy with only a rectum remaining. There is no evidence of bowel perforation or focal abscess. No free intraperitoneal air identified.  Small bowel loops are of normal caliber. No masses or lymphadenopathy identified in the abdomen or pelvis. No hernias are seen. The bladder contains a Foley catheter with some increased density seen surrounding the Foley catheter balloon. This may be consistent with some hole line in the bladder. Correlation suggested with urinary tract. Underlying bladder lesion cannot be excluded by unenhanced CT.  IMPRESSION: 1. Chest demonstrates  mucous plug formation and atelectasis in both posterior lower lobes. There is underlying COPD. No pneumothorax is identified. 2. Mild coronary atherosclerotic disease suspected with calcified plaque in the distal LAD and left circumflex territory. 3. Evidence of probable acute cholecystitis with free fluid surrounding the gallbladder. Cholelithiasis is suspected. Ultrasound correlation may be helpful. Some free fluid tracks around the liver. 4. Nonspecific inflammatory changes in the region of the gastrohepatic ligament and inferior to the stomach. Some of this may be postoperative in nature given evidence of prior subtotal colectomy. Recommend correlation with any laboratory evidence of pancreatitis. 5. No evidence of bowel perforation or obstruction. Status post subtotal colectomy. Right-sided ileostomy site appears uncomplicated. 6. High density in the bladder adjacent to the Foley catheter balloon. This may be reflective of blood given that IV contrast was not administered. Underlying bladder lesion cannot be excluded.   Electronically Signed   By: Aletta Edouard M.D.   On: 03/06/2014 09:22   Ct  Head Wo Contrast  03/06/2014   ADDENDUM REPORT: 03/06/2014 09:18  ADDENDUM: On axial slice 56 series 300, there is a 1.6 x 1.3 cm mass in the superficial soft tissues at the level of the angle of the mandible on the left. Suspect sebaceous cyst, although a focal lymph node could present in this manner.   Electronically Signed   By: Lowella Grip III M.D.   On: 03/06/2014 09:18   03/06/2014   CLINICAL DATA:  Patient unresponsive  EXAM: CT HEAD WITHOUT CONTRAST  CT CERVICAL SPINE WITHOUT CONTRAST  TECHNIQUE: Multidetector CT imaging of the head and cervical spine was performed following the standard protocol without intravenous contrast. Multiplanar CT image reconstructions of the cervical spine were also generated.  COMPARISON:  None.  FINDINGS: CT HEAD FINDINGS  The ventricles are normal in size and  configuration. There is no appreciable intracranial mass, hemorrhage, extra-axial fluid collection, or midline shift. There is a probable prominent prevascular space just lateral to the inferior aspect of the posterior limb of the left internal capsule. Gray-white compartments appear normal. No acute infarct apparent. Bony calvarium appears intact. The mastoid air cells are clear. The patient has had an antrostomy on the left. There is mucosal thickening in the inferior left maxillary antrum. There is a well-circumscribed rounded bony defect in the upper anterior hard palate. 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 There is evidence of previous surgery involving the anterior aspect of the right maxillary antrum wall.  CT CERVICAL SPINE FINDINGS  There is no fracture or spondylolisthesis. Prevertebral soft tissues and predental space regions are normal. Patient is intubated.  There is moderate disc space narrowing at C4-5, C5-6, and C6-7. There is mild narrowing at C3-4. There are central disc protrusions causing impression on the ventral cord at C3-4, C4-5, and C5-6. A smaller central disc protrusion is noted at C2-3. There is a degree of spinal stenosis due to the central disc protrusions at C3-4, C4-5, and C5-6. No erosive change or bony destruction.  IMPRESSION: CT head: No intracranial mass, hemorrhage, or extra-axial fluid collection. No focal gray-white compartment lesions/acute appearing infarct. Postoperative change in the face. Prosthetic right globe. Well-circumscribed bony defect anterior hard palate. Etiology for this defect is uncertain. It should be amenable to visual inspection.  CT cervical spine: Multifocal osteoarthritic change. There is a degree of spinal stenosis due to central disc protrusion at C3-4, C4-5, and C5-6. No fracture or spondylolisthesis. No erosive change or bony destruction.  Electronically Signed: By: Lowella Grip III M.D. On: 03/06/2014 09:14   Ct Chest Wo  Contrast  03/06/2014   CLINICAL DATA:  Patient unresponsive and intubated. Concern for sepsis with low body temperature. History of ulcerative colitis.  EXAM: CT CHEST, ABDOMEN AND PELVIS WITHOUT CONTRAST  TECHNIQUE: Multidetector CT imaging of the chest, abdomen and pelvis was performed following the standard protocol without IV contrast.  COMPARISON:  Chest x-ray ant [id number] hr  FINDINGS: CT CHEST FINDINGS  Endotracheal tube extends into the mid trachea. A nasogastric tube extends into the stomach. There is no evidence of pneumothorax with lungs demonstrating mild COPD and bleb formation in the peripheral lungs bilaterally. Lower lung zones demonstrate atelectasis and probable component of bilateral lower lobe mucous plugging in the posterior lower lobe bronchi. No pleural fluid is identified.  The heart size is normal. No pericardial effusion. The thoracic aorta shows atherosclerotic plaque without aneurysmal disease. A tiny amount of calcified plaque is suspected in the distribution of the LAD  and distal left circumflex coronary arteries. The thyroid gland is unremarkable. No evidence of pneumomediastinum or mediastinal mass. No lymphadenopathy identified. Bony structures are unremarkable.  CT ABDOMEN AND PELVIS FINDINGS  Acute cholecystitis is suspected with significant amount of fluid surrounding the gallbladder. Although the gallbladder is not overtly distended, there may be subtle underlying gallstones. Some free fluid tracks around the medial edge of the liver and extends up to the liver dome. There are inflammatory changes in the region of the gastrohepatic ligament and also in the left upper quadrant immediately inferior to the stomach. This appearance is nonspecific. Recommend correlation with laboratory evidence of pancreatitis. The pancreas itself appears fairly unremarkable by unenhanced CT.  There is evidence of an ileostomy in the right lower abdomen. The patient appears to be status post prior  subtotal colectomy with only a rectum remaining. There is no evidence of bowel perforation or focal abscess. No free intraperitoneal air identified.  Small bowel loops are of normal caliber. No masses or lymphadenopathy identified in the abdomen or pelvis. No hernias are seen. The bladder contains a Foley catheter with some increased density seen surrounding the Foley catheter balloon. This may be consistent with some hole line in the bladder. Correlation suggested with urinary tract. Underlying bladder lesion cannot be excluded by unenhanced CT.  IMPRESSION: 1. Chest demonstrates mucous plug formation and atelectasis in both posterior lower lobes. There is underlying COPD. No pneumothorax is identified. 2. Mild coronary atherosclerotic disease suspected with calcified plaque in the distal LAD and left circumflex territory. 3. Evidence of probable acute cholecystitis with free fluid surrounding the gallbladder. Cholelithiasis is suspected. Ultrasound correlation may be helpful. Some free fluid tracks around the liver. 4. Nonspecific inflammatory changes in the region of the gastrohepatic ligament and inferior to the stomach. Some of this may be postoperative in nature given evidence of prior subtotal colectomy. Recommend correlation with any laboratory evidence of pancreatitis. 5. No evidence of bowel perforation or obstruction. Status post subtotal colectomy. Right-sided ileostomy site appears uncomplicated. 6. High density in the bladder adjacent to the Foley catheter balloon. This may be reflective of blood given that IV contrast was not administered. Underlying bladder lesion cannot be excluded.   Electronically Signed   By: Aletta Edouard M.D.   On: 03/06/2014 09:22   Ct Cervical Spine Wo Contrast  03/06/2014   ADDENDUM REPORT: 03/06/2014 09:18  ADDENDUM: On axial slice 56 series 245, there is a 1.6 x 1.3 cm mass in the superficial soft tissues at the level of the angle of the mandible on the left. Suspect  sebaceous cyst, although a focal lymph node could present in this manner.   Electronically Signed   By: Lowella Grip III M.D.   On: 03/06/2014 09:18   03/06/2014   CLINICAL DATA:  Patient unresponsive  EXAM: CT HEAD WITHOUT CONTRAST  CT CERVICAL SPINE WITHOUT CONTRAST  TECHNIQUE: Multidetector CT imaging of the head and cervical spine was performed following the standard protocol without intravenous contrast. Multiplanar CT image reconstructions of the cervical spine were also generated.  COMPARISON:  None.  FINDINGS: CT HEAD FINDINGS  The ventricles are normal in size and configuration. There is no appreciable intracranial mass, hemorrhage, extra-axial fluid collection, or midline shift. There is a probable prominent prevascular space just lateral to the inferior aspect of the posterior limb of the left internal capsule. Gray-white compartments appear normal. No acute infarct apparent. Bony calvarium appears intact. The mastoid air cells are clear. The patient has had  an antrostomy on the left. There is mucosal thickening in the inferior left maxillary antrum. There is a well-circumscribed rounded bony defect in the upper anterior hard palate. 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 There is evidence of previous surgery involving the anterior aspect of the right maxillary antrum wall.  CT CERVICAL SPINE FINDINGS  There is no fracture or spondylolisthesis. Prevertebral soft tissues and predental space regions are normal. Patient is intubated.  There is moderate disc space narrowing at C4-5, C5-6, and C6-7. There is mild narrowing at C3-4. There are central disc protrusions causing impression on the ventral cord at C3-4, C4-5, and C5-6. A smaller central disc protrusion is noted at C2-3. There is a degree of spinal stenosis due to the central disc protrusions at C3-4, C4-5, and C5-6. No erosive change or bony destruction.  IMPRESSION: CT head: No intracranial mass, hemorrhage, or extra-axial fluid collection.  No focal gray-white compartment lesions/acute appearing infarct. Postoperative change in the face. Prosthetic right globe. Well-circumscribed bony defect anterior hard palate. Etiology for this defect is uncertain. It should be amenable to visual inspection.  CT cervical spine: Multifocal osteoarthritic change. There is a degree of spinal stenosis due to central disc protrusion at C3-4, C4-5, and C5-6. No fracture or spondylolisthesis. No erosive change or bony destruction.  Electronically Signed: By: Lowella Grip III M.D. On: 03/06/2014 09:14   Ir Perc Cholecystostomy  03/06/2014   CLINICAL DATA:  Acute calculus cholecystitis  EXAM: Ultrasound percutaneous transhepatic cholecystostomy (portable)  Date:  2/19/20162/19/2016 1:52 pm  Radiologist:  Jerilynn Mages. Daryll Brod, MD  Guidance:  Ultrasound  FLUOROSCOPY TIME:  None.  MEDICATIONS AND MEDICAL HISTORY: 1% lidocaine locally. Patient is already sedated and receiving broad spectrum IV antibiotics daily.  ANESTHESIA/SEDATION: None.  CONTRAST:  None.  COMPLICATIONS: None immediate  PROCEDURE: Informed consent was obtained from the patient following explanation of the procedure, risks, benefits and alternatives. The patient understands, agrees and consents for the procedure. All questions were addressed. A time out was performed.  Maximal barrier sterile technique utilized including caps, mask, sterile gowns, sterile gloves, large sterile drape, hand hygiene, and Betadine.  Previous imaging reviewed.  At the bedside, ultrasound was performed of the right upper quadrant. The gallbladder was localized just inferior to the right subcostal margin. There is diffuse wall thickening and edema. Gallbladder is not significantly distended. Large gallstone noted. Under sterile conditions and local anesthesia, a 21 gauge needle was advanced percutaneously through a transhepatic windowed into the gallbladder. Needle position confirmed with ultrasound. Images obtained for  documentation. Guidewire advanced into the gallbladder. Guidewire position confirmed with ultrasound. Accustick dilator set advanced. Guidewire exchanged for an Amplatz guidewire. Guidewire again confirmed within the gallbladder with ultrasound. Tract dilatation performed to advance a 10 [place] drain. Retention loop formed in the gallbladder. Syringe aspiration yielded thick bile. Sample sent for Gram stain culture. Catheter secured with a Prolene suture and connected to external gravity drainage. Sterile dressing applied. No Immediate complication. Patient tolerated the procedure well.  IMPRESSION: Successful ultrasound percutaneous transhepatic cholecystostomy.   Electronically Signed   By: Jerilynn Mages.  Shick M.D.   On: 03/06/2014 14:13   Dg Chest Port 1 View  03/06/2014   CLINICAL DATA:  Central catheter placement  EXAM: PORTABLE CHEST - 1 VIEW  COMPARISON:  Chest radiograph and chest CT obtained earlier in the day  FINDINGS: Central catheter tip is in the superior vena cava. Endotracheal tube tip is 4.7 cm above the carina. Nasogastric tube tip and side  port are in the stomach. No pneumothorax. There is patchy bibasilar atelectatic change. Elsewhere lungs are clear. Heart size and pulmonary vascularity are normal. No adenopathy.  IMPRESSION: Tube and catheter positions as described without pneumothorax. Patchy bibasilar atelectatic change. Lungs elsewhere clear. No change in cardiac silhouette.   Electronically Signed   By: Lowella Grip III M.D.   On: 03/06/2014 10:01   Dg Chest Portable 1 View  03/06/2014   CLINICAL DATA:  ET and OG tube placement.  EXAM: PORTABLE CHEST - 1 VIEW  COMPARISON:  None.  FINDINGS: Endotracheal tube is at the level of the clavicular heads. Orogastric tube extends into the stomach. The lungs are expanded. There is a deep costophrenic angle on the right, suggesting a small pneumothorax but this is not confirmed by identification of the pleural line. No large pneumothorax is evident.  No effusion is evident.  IMPRESSION: ET and OG tube as described.  Questionable small right pneumothorax.   Electronically Signed   By: Andreas Newport M.D.   On: 03/06/2014 06:44     ASSESSMENT / PLAN:  PULMONARY OETT2/19  A:Acute respiratory failure due to shock  P:   vent bundle  Repeat ABG reviewed, may need slight reduction MV SBT , cpap 5 ps 5, assess rsbi at 2 hr  CARDIOVASCULAR CVL2/19 >> A: Septic shock  P:  3 L fluid given in ED  Repeat lactate for clearance  vasopressin gtt 2/19>>2/20 Levophed gtt 2/19>>2/20 Re assess cvp, would like to reduce fluids if able as off pressors  RENAL A:  AKI Hyperkalemia  Severe metabolic acidosis  P:   Bicarbonate drip at 125 an hour = dc Follow BMP per crrt Monitor urine output  GASTROINTESTINAL A:  Ulcerative colitis , colostomy, acute cholecystitis P:   Nothing by mouth Tube feeds start if not extubated Drain placed to remain Lipase assessment   HEMATOLOGIC A:  Leukemoid from sepsis Anemia of chronic illness P:  Follow wbc further dvt prevention Sub q hep  INFECTIOUS A:  Septic shock -source is possibly UTI , wounds, acute cholecysttis Sacral wound P:   BCx2 2/19  UC 2/19  Sputum2/19  Abx:  2/19 Cefepime vancomycin, start date 2/19 >> Flagyl 2/20>>>  Ad flagyl Drain in place If stagnate progress, add diflucan    ENDOCRINE A:  Risk for hypoglycemia   P:   SSI order set Discontinue insulin drip  NEUROLOGIC A:  Acute encephalopathy -related to shock and metabolic  P:   RASS goal: 0 intermittent fentanyl   FAMILY  - Updates: 2/20 sister updated  - Inter-disciplinary family meet or Palliative Care meeting due by:  2/26    TODAY'S SUMMARY: Septic shock, AKI with hyperkalemia- source could be UTI, abdomen or wounds    Cordelia Poche, MD PGY-2, [facility] 03/07/2014, 7:35 AM  STAFF NOTE: I, Merrie Roof, MD FACP have personally reviewed patient's available data,  including medical history, events of note, physical examination and test results as part of my evaluation. I have discussed with resident/NP and other care providers such as pharmacist, RN and RRT. In addition, I personally evaluated patient and elicited key findings of: more alert, follows commands, off pressors, abdo exam reassuring, drain in place, add flagyl, continued crrt, wean with hopes for extubation, may need diflucan The patient is critically ill with multiple organ systems failure and requires high complexity decision making for assessment and support, frequent evaluation and titration of therapies, application of advanced monitoring technologies and extensive interpretation  of multiple databases.   Critical Care Time devoted to patient care services described in this note is30 Minutes. This time reflects time of care of this signee: Merrie Roof, MD FACP. This critical care time does not reflect procedure time, or teaching time or supervisory time of PA/NP/Med student/Med Resident etc but could involve care discussion time. Rest per NP/medical resident whose note is outlined above and that I agree with   Lavon Paganini. Titus Mould, MD, [place] Pgr: [facility] 03/07/2014 9:33 AM

## 2014-03-07 NOTE — Progress Notes (Signed)
Pt waking up and trying to pull out lines/tubes.  Pt biting ETT.  Pt reoriented and 22mcg fentanyl given with little effect.  Dr. Tamala Julian notified, fentanyl gtt ordered.  Will monitor pt.

## 2014-03-07 NOTE — Progress Notes (Signed)
Subjective: Patient has his eyes open, following some commands WBC still high but decreasing On CRRT   Objective: Vital signs in last 24 hours: Temp:  [85.9 F (29.9 C)-98.6 F (37 C)] 96.3 F (35.7 C) (02/20 0700) Pulse Rate:  [25-150] 99 (02/20 0809) Resp:  [13-26] 17 (02/20 0809) BP: (89-161)/(39-102) 145/82 mmHg (02/20 0809) SpO2:  [97 %-100 %] 100 % (02/20 0700) FiO2 (%):  [30 %] 30 % (02/20 0809) Weight:  [133 lb 2.5 oz (60.4 kg)-143 lb 11.8 oz (65.2 kg)] 143 lb 11.8 oz (65.2 kg) (02/20 0400) Last BM Date: 03/06/14  Intake/Output from previous day: 02/19 0701 - 02/20 0700 In: 2786.3 [I.V.:2193.8; NG/GT:150; IV Piggyback:300] Out: 1726 [Urine:35; Emesis/NG output:650; Drains:235] Intake/Output this shift:    General appearance: on ventilator, but eyes open GI: soft, no apparent tenderness; percutaneous cholecystostomy with clear bile Ileostomy pink with soft stool output  Lab Results:   Recent Labs  03/06/14 0738 03/06/14 1157 03/07/14 0404  WBC 49.0*  --  37.0*  HGB 7.0* 8.5* 7.6*  HCT 23.1* 25.0* 23.5*  PLT 699*  --  602*   BMET  Recent Labs  03/06/14 2220 03/07/14 0404  NA 132* 135  K 4.2 4.2  CL 90* 91*  CO2 19 19  GLUCOSE 67* 49*  BUN 70* 55*  CREATININE 4.39* 3.66*  CALCIUM 8.6 8.0*   PT/INR  Recent Labs  03/06/14 0554  LABPROT 18.6*  INR 1.54*   ABG  Recent Labs  03/06/14 1635 03/07/14 0310  PHART 7.268* 7.416  HCO3 11.6* 15.9*    Studies/Results: Ct Abdomen Pelvis Wo Contrast  03/06/2014   CLINICAL DATA:  Patient unresponsive and intubated. Concern for sepsis with low body temperature. History of ulcerative colitis.  EXAM: CT CHEST, ABDOMEN AND PELVIS WITHOUT CONTRAST  TECHNIQUE: Multidetector CT imaging of the chest, abdomen and pelvis was performed following the standard protocol without IV contrast.  COMPARISON:  Chest x-ray ant [phone] hr  FINDINGS: CT CHEST FINDINGS  Endotracheal tube extends into the mid trachea. A  nasogastric tube extends into the stomach. There is no evidence of pneumothorax with lungs demonstrating mild COPD and bleb formation in the peripheral lungs bilaterally. Lower lung zones demonstrate atelectasis and probable component of bilateral lower lobe mucous plugging in the posterior lower lobe bronchi. No pleural fluid is identified.  The heart size is normal. No pericardial effusion. The thoracic aorta shows atherosclerotic plaque without aneurysmal disease. A tiny amount of calcified plaque is suspected in the distribution of the LAD and distal left circumflex coronary arteries. The thyroid gland is unremarkable. No evidence of pneumomediastinum or mediastinal mass. No lymphadenopathy identified. Bony structures are unremarkable.  CT ABDOMEN AND PELVIS FINDINGS  Acute cholecystitis is suspected with significant amount of fluid surrounding the gallbladder. Although the gallbladder is not overtly distended, there may be subtle underlying gallstones. Some free fluid tracks around the medial edge of the liver and extends up to the liver dome. There are inflammatory changes in the region of the gastrohepatic ligament and also in the left upper quadrant immediately inferior to the stomach. This appearance is nonspecific. Recommend correlation with laboratory evidence of pancreatitis. The pancreas itself appears fairly unremarkable by unenhanced CT.  There is evidence of an ileostomy in the right lower abdomen. The patient appears to be status post prior subtotal colectomy with only a rectum remaining. There is no evidence of bowel perforation or focal abscess. No free intraperitoneal air identified.  Small bowel loops are of normal  caliber. No masses or lymphadenopathy identified in the abdomen or pelvis. No hernias are seen. The bladder contains a Foley catheter with some increased density seen surrounding the Foley catheter balloon. This may be consistent with some hole line in the bladder. Correlation  suggested with urinary tract. Underlying bladder lesion cannot be excluded by unenhanced CT.  IMPRESSION: 1. Chest demonstrates mucous plug formation and atelectasis in both posterior lower lobes. There is underlying COPD. No pneumothorax is identified. 2. Mild coronary atherosclerotic disease suspected with calcified plaque in the distal LAD and left circumflex territory. 3. Evidence of probable acute cholecystitis with free fluid surrounding the gallbladder. Cholelithiasis is suspected. Ultrasound correlation may be helpful. Some free fluid tracks around the liver. 4. Nonspecific inflammatory changes in the region of the gastrohepatic ligament and inferior to the stomach. Some of this may be postoperative in nature given evidence of prior subtotal colectomy. Recommend correlation with any laboratory evidence of pancreatitis. 5. No evidence of bowel perforation or obstruction. Status post subtotal colectomy. Right-sided ileostomy site appears uncomplicated. 6. High density in the bladder adjacent to the Foley catheter balloon. This may be reflective of blood given that IV contrast was not administered. Underlying bladder lesion cannot be excluded.   Electronically Signed   By: Aletta Edouard M.D.   On: 03/06/2014 09:22   Ct Head Wo Contrast  03/06/2014   ADDENDUM REPORT: 03/06/2014 09:18  ADDENDUM: On axial slice 56 series 366, there is a 1.6 x 1.3 cm mass in the superficial soft tissues at the level of the angle of the mandible on the left. Suspect sebaceous cyst, although a focal lymph node could present in this manner.   Electronically Signed   By: Lowella Grip III M.D.   On: 03/06/2014 09:18   03/06/2014   CLINICAL DATA:  Patient unresponsive  EXAM: CT HEAD WITHOUT CONTRAST  CT CERVICAL SPINE WITHOUT CONTRAST  TECHNIQUE: Multidetector CT imaging of the head and cervical spine was performed following the standard protocol without intravenous contrast. Multiplanar CT image reconstructions of the  cervical spine were also generated.  COMPARISON:  None.  FINDINGS: CT HEAD FINDINGS  The ventricles are normal in size and configuration. There is no appreciable intracranial mass, hemorrhage, extra-axial fluid collection, or midline shift. There is a probable prominent prevascular space just lateral to the inferior aspect of the posterior limb of the left internal capsule. Gray-white compartments appear normal. No acute infarct apparent. Bony calvarium appears intact. The mastoid air cells are clear. The patient has had an antrostomy on the left. There is mucosal thickening in the inferior left maxillary antrum. There is a well-circumscribed rounded bony defect in the upper anterior hard palate. 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 There is evidence of previous surgery involving the anterior aspect of the right maxillary antrum wall.  CT CERVICAL SPINE FINDINGS  There is no fracture or spondylolisthesis. Prevertebral soft tissues and predental space regions are normal. Patient is intubated.  There is moderate disc space narrowing at C4-5, C5-6, and C6-7. There is mild narrowing at C3-4. There are central disc protrusions causing impression on the ventral cord at C3-4, C4-5, and C5-6. A smaller central disc protrusion is noted at C2-3. There is a degree of spinal stenosis due to the central disc protrusions at C3-4, C4-5, and C5-6. No erosive change or bony destruction.  IMPRESSION: CT head: No intracranial mass, hemorrhage, or extra-axial fluid collection. No focal gray-white compartment lesions/acute appearing infarct. Postoperative change in the face. Prosthetic  right globe. Well-circumscribed bony defect anterior hard palate. Etiology for this defect is uncertain. It should be amenable to visual inspection.  CT cervical spine: Multifocal osteoarthritic change. There is a degree of spinal stenosis due to central disc protrusion at C3-4, C4-5, and C5-6. No fracture or spondylolisthesis. No erosive change or  bony destruction.  Electronically Signed: By: Lowella Grip III M.D. On: 03/06/2014 09:14   Ct Chest Wo Contrast  03/06/2014   CLINICAL DATA:  Patient unresponsive and intubated. Concern for sepsis with low body temperature. History of ulcerative colitis.  EXAM: CT CHEST, ABDOMEN AND PELVIS WITHOUT CONTRAST  TECHNIQUE: Multidetector CT imaging of the chest, abdomen and pelvis was performed following the standard protocol without IV contrast.  COMPARISON:  Chest x-ray ant [phone] hr  FINDINGS: CT CHEST FINDINGS  Endotracheal tube extends into the mid trachea. A nasogastric tube extends into the stomach. There is no evidence of pneumothorax with lungs demonstrating mild COPD and bleb formation in the peripheral lungs bilaterally. Lower lung zones demonstrate atelectasis and probable component of bilateral lower lobe mucous plugging in the posterior lower lobe bronchi. No pleural fluid is identified.  The heart size is normal. No pericardial effusion. The thoracic aorta shows atherosclerotic plaque without aneurysmal disease. A tiny amount of calcified plaque is suspected in the distribution of the LAD and distal left circumflex coronary arteries. The thyroid gland is unremarkable. No evidence of pneumomediastinum or mediastinal mass. No lymphadenopathy identified. Bony structures are unremarkable.  CT ABDOMEN AND PELVIS FINDINGS  Acute cholecystitis is suspected with significant amount of fluid surrounding the gallbladder. Although the gallbladder is not overtly distended, there may be subtle underlying gallstones. Some free fluid tracks around the medial edge of the liver and extends up to the liver dome. There are inflammatory changes in the region of the gastrohepatic ligament and also in the left upper quadrant immediately inferior to the stomach. This appearance is nonspecific. Recommend correlation with laboratory evidence of pancreatitis. The pancreas itself appears fairly unremarkable by unenhanced CT.   There is evidence of an ileostomy in the right lower abdomen. The patient appears to be status post prior subtotal colectomy with only a rectum remaining. There is no evidence of bowel perforation or focal abscess. No free intraperitoneal air identified.  Small bowel loops are of normal caliber. No masses or lymphadenopathy identified in the abdomen or pelvis. No hernias are seen. The bladder contains a Foley catheter with some increased density seen surrounding the Foley catheter balloon. This may be consistent with some hole line in the bladder. Correlation suggested with urinary tract. Underlying bladder lesion cannot be excluded by unenhanced CT.  IMPRESSION: 1. Chest demonstrates mucous plug formation and atelectasis in both posterior lower lobes. There is underlying COPD. No pneumothorax is identified. 2. Mild coronary atherosclerotic disease suspected with calcified plaque in the distal LAD and left circumflex territory. 3. Evidence of probable acute cholecystitis with free fluid surrounding the gallbladder. Cholelithiasis is suspected. Ultrasound correlation may be helpful. Some free fluid tracks around the liver. 4. Nonspecific inflammatory changes in the region of the gastrohepatic ligament and inferior to the stomach. Some of this may be postoperative in nature given evidence of prior subtotal colectomy. Recommend correlation with any laboratory evidence of pancreatitis. 5. No evidence of bowel perforation or obstruction. Status post subtotal colectomy. Right-sided ileostomy site appears uncomplicated. 6. High density in the bladder adjacent to the Foley catheter balloon. This may be reflective of blood given that IV contrast was not administered.  Underlying bladder lesion cannot be excluded.   Electronically Signed   By: Aletta Edouard M.D.   On: 03/06/2014 09:22   Ct Cervical Spine Wo Contrast  03/06/2014   ADDENDUM REPORT: 03/06/2014 09:18  ADDENDUM: On axial slice 56 series 754, there is a 1.6 x  1.3 cm mass in the superficial soft tissues at the level of the angle of the mandible on the left. Suspect sebaceous cyst, although a focal lymph node could present in this manner.   Electronically Signed   By: Lowella Grip III M.D.   On: 03/06/2014 09:18   03/06/2014   CLINICAL DATA:  Patient unresponsive  EXAM: CT HEAD WITHOUT CONTRAST  CT CERVICAL SPINE WITHOUT CONTRAST  TECHNIQUE: Multidetector CT imaging of the head and cervical spine was performed following the standard protocol without intravenous contrast. Multiplanar CT image reconstructions of the cervical spine were also generated.  COMPARISON:  None.  FINDINGS: CT HEAD FINDINGS  The ventricles are normal in size and configuration. There is no appreciable intracranial mass, hemorrhage, extra-axial fluid collection, or midline shift. There is a probable prominent prevascular space just lateral to the inferior aspect of the posterior limb of the left internal capsule. Gray-white compartments appear normal. No acute infarct apparent. Bony calvarium appears intact. The mastoid air cells are clear. The patient has had an antrostomy on the left. There is mucosal thickening in the inferior left maxillary antrum. There is a well-circumscribed rounded bony defect in the upper anterior hard palate. 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 There is evidence of previous surgery involving the anterior aspect of the right maxillary antrum wall.  CT CERVICAL SPINE FINDINGS  There is no fracture or spondylolisthesis. Prevertebral soft tissues and predental space regions are normal. Patient is intubated.  There is moderate disc space narrowing at C4-5, C5-6, and C6-7. There is mild narrowing at C3-4. There are central disc protrusions causing impression on the ventral cord at C3-4, C4-5, and C5-6. A smaller central disc protrusion is noted at C2-3. There is a degree of spinal stenosis due to the central disc protrusions at C3-4, C4-5, and C5-6. No erosive change or  bony destruction.  IMPRESSION: CT head: No intracranial mass, hemorrhage, or extra-axial fluid collection. No focal gray-white compartment lesions/acute appearing infarct. Postoperative change in the face. Prosthetic right globe. Well-circumscribed bony defect anterior hard palate. Etiology for this defect is uncertain. It should be amenable to visual inspection.  CT cervical spine: Multifocal osteoarthritic change. There is a degree of spinal stenosis due to central disc protrusion at C3-4, C4-5, and C5-6. No fracture or spondylolisthesis. No erosive change or bony destruction.  Electronically Signed: By: Lowella Grip III M.D. On: 03/06/2014 09:14   Ir Perc Cholecystostomy  03/06/2014   CLINICAL DATA:  Acute calculus cholecystitis  EXAM: Ultrasound percutaneous transhepatic cholecystostomy (portable)  Date:  2/19/20162/19/2016 1:52 pm  Radiologist:  Jerilynn Mages. Daryll Brod, MD  Guidance:  Ultrasound  FLUOROSCOPY TIME:  None.  MEDICATIONS AND MEDICAL HISTORY: 1% lidocaine locally. Patient is already sedated and receiving broad spectrum IV antibiotics daily.  ANESTHESIA/SEDATION: None.  CONTRAST:  None.  COMPLICATIONS: None immediate  PROCEDURE: Informed consent was obtained from the patient following explanation of the procedure, risks, benefits and alternatives. The patient understands, agrees and consents for the procedure. All questions were addressed. A time out was performed.  Maximal barrier sterile technique utilized including caps, mask, sterile gowns, sterile gloves, large sterile drape, hand hygiene, and Betadine.  Previous imaging reviewed.  At the bedside, ultrasound was performed of the right upper quadrant. The gallbladder was localized just inferior to the right subcostal margin. There is diffuse wall thickening and edema. Gallbladder is not significantly distended. Large gallstone noted. Under sterile conditions and local anesthesia, a 21 gauge needle was advanced percutaneously through a  transhepatic windowed into the gallbladder. Needle position confirmed with ultrasound. Images obtained for documentation. Guidewire advanced into the gallbladder. Guidewire position confirmed with ultrasound. Accustick dilator set advanced. Guidewire exchanged for an Amplatz guidewire. Guidewire again confirmed within the gallbladder with ultrasound. Tract dilatation performed to advance a 10 [place] drain. Retention loop formed in the gallbladder. Syringe aspiration yielded thick bile. Sample sent for Gram stain culture. Catheter secured with a Prolene suture and connected to external gravity drainage. Sterile dressing applied. No Immediate complication. Patient tolerated the procedure well.  IMPRESSION: Successful ultrasound percutaneous transhepatic cholecystostomy.   Electronically Signed   By: Jerilynn Mages.  Shick M.D.   On: 03/06/2014 14:13   Dg Chest Port 1 View  03/07/2014   CLINICAL DATA:  69 year old male with a history of respiratory failure.  EXAM: PORTABLE CHEST - 1 VIEW  COMPARISON:  Chest x-ray 03/06/2014, CT 03/06/2014  FINDINGS: Cardiomediastinal silhouette unchanged in size and contour.  Endotracheal tube appears to have been slightly advanced, previously measuring 6.2 cm from the carina, currently measuring 5.7 cm from the carina.  Gastric tube projects over the mediastinum, terminating in the left upper quadrant. The side port appears to be above the GE junction.  Right IJ central venous catheter is unchanged in position with the tip terminating over the region of the superior vena cava.  Overlying EKG leads.  Similar appearance of streaky opacities throughout the lungs, extending from the hilar regions. No visualized pneumothorax. No large pleural effusion.  IMPRESSION: Similar appearance of the chest x-ray, with linear opacities extending from the hilar region, potentially representing atelectasis/ lung volume loss.  The endotracheal tube has been advanced slightly, terminating 5.7 cm above the carina.   Unchanged position of right IJ catheter.  Enteric tube terminates in the left upper quadrant, with the side port appearing to be above the GE junction.  Signed,  Dulcy Fanny. Earleen Newport, DO  [facility]  [facility]   Electronically Signed   By: Corrie Mckusick D.O.   On: 03/07/2014 07:35   Dg Chest Port 1 View  03/06/2014   CLINICAL DATA:  Central catheter placement  EXAM: PORTABLE CHEST - 1 VIEW  COMPARISON:  Chest radiograph and chest CT obtained earlier in the day  FINDINGS: Central catheter tip is in the superior vena cava. Endotracheal tube tip is 4.7 cm above the carina. Nasogastric tube tip and side port are in the stomach. No pneumothorax. There is patchy bibasilar atelectatic change. Elsewhere lungs are clear. Heart size and pulmonary vascularity are normal. No adenopathy.  IMPRESSION: Tube and catheter positions as described without pneumothorax. Patchy bibasilar atelectatic change. Lungs elsewhere clear. No change in cardiac silhouette.   Electronically Signed   By: Lowella Grip III M.D.   On: 03/06/2014 10:01   Dg Chest Portable 1 View  03/06/2014   CLINICAL DATA:  ET and OG tube placement.  EXAM: PORTABLE CHEST - 1 VIEW  COMPARISON:  None.  FINDINGS: Endotracheal tube is at the level of the clavicular heads. Orogastric tube extends into the stomach. The lungs are expanded. There is a deep costophrenic angle on the right, suggesting a small pneumothorax but this is not confirmed by  identification of the pleural line. No large pneumothorax is evident. No effusion is evident.  IMPRESSION: ET and OG tube as described.  Questionable small right pneumothorax.   Electronically Signed   By: Andreas Newport M.D.   On: 03/06/2014 06:44    Anti-infectives: Anti-infectives    Start     Dose/Rate Route Frequency Ordered Stop   03/07/14 0600  vancomycin (VANCOCIN) IVPB 750 mg/150 ml premix     750 mg 150 mL/hr over 60 Minutes Intravenous Every 24 hours  03/06/14 1311     03/06/14 2200  ceFEPIme (MAXIPIME) 2 g in dextrose 5 % 50 mL IVPB     2 g 100 mL/hr over 30 Minutes Intravenous Every 12 hours 03/06/14 1311     03/06/14 1230  piperacillin-tazobactam (ZOSYN) IVPB 3.375 g     3.375 g 100 mL/hr over 30 Minutes Intravenous  Once 03/06/14 1200 03/06/14 1316   03/06/14 1200  piperacillin-tazobactam (ZOSYN) IVPB 3.375 g  Status:  Discontinued     3.375 g 12.5 mL/hr over 240 Minutes Intravenous  Once 03/06/14 1156 03/06/14 1159   03/06/14 0630  ceFEPIme (MAXIPIME) 1 g in dextrose 5 % 50 mL IVPB     1 g 100 mL/hr over 30 Minutes Intravenous  Once 03/06/14 0619 03/06/14 0703   03/06/14 0630  vancomycin (VANCOCIN) 1,250 mg in sodium chloride 0.9 % 250 mL IVPB     1,250 mg 166.7 mL/hr over 90 Minutes Intravenous  Once 03/06/14 9753 03/06/14 0810      Assessment/Plan: s/p percutaneous cholecystostomy for acute cholecystitis  It is unclear whether the sacral wound is causing his sepsis.  If he continues to improve, then we can consider surgical debridement next week.  LOS: 1 day    Yazen Rosko K. 03/07/2014

## 2014-03-08 LAB — CBC
HCT: 19.3 % — ABNORMAL LOW (ref 39.0–52.0)
Hemoglobin: 6.3 g/dL — CL (ref 13.0–17.0)
MCH: 27.6 pg (ref 26.0–34.0)
MCHC: 32.6 g/dL (ref 30.0–36.0)
MCV: 84.6 fL (ref 78.0–100.0)
Platelets: 448 10*3/uL — ABNORMAL HIGH (ref 150–400)
RBC: 2.28 MIL/uL — ABNORMAL LOW (ref 4.22–5.81)
RDW: 16.8 % — AB (ref 11.5–15.5)
WBC: 24.3 10*3/uL — ABNORMAL HIGH (ref 4.0–10.5)

## 2014-03-08 LAB — POCT ACTIVATED CLOTTING TIME
ACTIVATED CLOTTING TIME: 190 s
Activated Clotting Time: 183 seconds
Activated Clotting Time: 190 seconds
Activated Clotting Time: 196 seconds
Activated Clotting Time: 214 seconds

## 2014-03-08 LAB — GLUCOSE, CAPILLARY
GLUCOSE-CAPILLARY: 110 mg/dL — AB (ref 70–99)
Glucose-Capillary: 113 mg/dL — ABNORMAL HIGH (ref 70–99)
Glucose-Capillary: 139 mg/dL — ABNORMAL HIGH (ref 70–99)
Glucose-Capillary: 140 mg/dL — ABNORMAL HIGH (ref 70–99)
Glucose-Capillary: 161 mg/dL — ABNORMAL HIGH (ref 70–99)
Glucose-Capillary: 99 mg/dL (ref 70–99)

## 2014-03-08 LAB — RENAL FUNCTION PANEL
ANION GAP: 7 (ref 5–15)
Albumin: 1.8 g/dL — ABNORMAL LOW (ref 3.5–5.2)
BUN: 22 mg/dL (ref 6–23)
CO2: 27 mmol/L (ref 19–32)
Calcium: 7.4 mg/dL — ABNORMAL LOW (ref 8.4–10.5)
Chloride: 102 mmol/L (ref 96–112)
Creatinine, Ser: 1.81 mg/dL — ABNORMAL HIGH (ref 0.50–1.35)
GFR calc Af Amer: 43 mL/min — ABNORMAL LOW (ref >90)
GFR calc non Af Amer: 37 mL/min — ABNORMAL LOW (ref >90)
Glucose, Bld: 107 mg/dL — ABNORMAL HIGH (ref 70–99)
POTASSIUM: 4.1 mmol/L (ref 3.5–5.1)
Phosphorus: 2.2 mg/dL — ABNORMAL LOW (ref 2.3–4.6)
SODIUM: 136 mmol/L (ref 135–145)

## 2014-03-08 LAB — MAGNESIUM: MAGNESIUM: 2.2 mg/dL (ref 1.5–2.5)

## 2014-03-08 LAB — APTT: aPTT: >200 seconds (ref 24–37)

## 2014-03-08 LAB — LACTIC ACID, PLASMA: LACTIC ACID, VENOUS: 1.1 mmol/L (ref 0.5–2.0)

## 2014-03-08 LAB — PREPARE RBC (CROSSMATCH)

## 2014-03-08 MED ORDER — CETYLPYRIDINIUM CHLORIDE 0.05 % MT LIQD
7.0000 mL | Freq: Two times a day (BID) | OROMUCOSAL | Status: DC
  Administered 2014-03-09 (×2): 7 mL via OROMUCOSAL

## 2014-03-08 MED ORDER — LEVOTHYROXINE SODIUM 100 MCG PO TABS
100.0000 ug | ORAL_TABLET | Freq: Every day | ORAL | Status: DC
  Administered 2014-03-08 – 2014-03-10 (×3): 100 ug via ORAL
  Filled 2014-03-08 (×4): qty 1

## 2014-03-08 MED ORDER — PANTOPRAZOLE SODIUM 40 MG PO TBEC
40.0000 mg | DELAYED_RELEASE_TABLET | Freq: Every day | ORAL | Status: DC
  Administered 2014-03-08 – 2014-03-10 (×3): 40 mg via ORAL
  Filled 2014-03-08 (×3): qty 1

## 2014-03-08 MED ORDER — SODIUM CHLORIDE 0.9 % IV SOLN
Freq: Once | INTRAVENOUS | Status: AC
  Administered 2014-03-09: 10 mL/h via INTRAVENOUS

## 2014-03-08 NOTE — Progress Notes (Signed)
PULMONARY / CRITICAL CARE MEDICINE   Name: Gabriel Lynch MRN: [id number] DOB: January 16, 1946    ADMISSION DATE:  03/06/2014  REFERRING MD :  EDP  CHIEF COMPLAINT:  Brought in unresponsive  INITIAL PRESENTATION: 69 year old, nursing home resident, with ulcerative colitis/colostomy and wound VAC to sacral wound, BIBEMS unresponsive, with septic shock and AKI  STUDIES:  CT abdomen 2/19  SIGNIFICANT EVENTS: 2/19 ETT >>2/20  SUBJECTIVE:  Confused. No distress  VITAL SIGNS: Temp:  [95.6 F (35.3 C)-98.5 F (36.9 C)] 96.7 F (35.9 C) (02/21 0700) Pulse Rate:  [62-117] 78 (02/21 0700) Resp:  [11-24] 15 (02/21 0700) BP: (108-165)/(44-73) 133/73 mmHg (02/21 0700) SpO2:  [96 %-100 %] 100 % (02/21 0700) Weight:  [66.4 kg (146 lb 6.2 oz)] 66.4 kg (146 lb 6.2 oz) (02/21 0500) 1 liter HEMODYNAMICS:   VENTILATOR SETTINGS:   INTAKE / OUTPUT:  Intake/Output Summary (Last 24 hours) at 03/08/14 8366 Last data filed at 03/08/14 0800  Gross per 24 hour  Intake 2288.67 ml  Output    868 ml  Net 1420.67 ml    PHYSICAL EXAMINATION: General:  Chronically ill-appearing now extubated. Not in distress.  Neuro:  Alert, responds to commands, confused.  HEENT:  Right glass eye , left pupil 3 mm, no JVD  Cardiovascular:  S1-S2 regular rate and rhythm Lungs:  Clear to auscultation bilaterally; decreased bases  Abdomen:  Soft , colostomy with minimal dark brown output Musculoskeletal:  Left shin with wound bandage, large sacral wound  Skin:  Flaky scaling over both feet  LABS:  CBC  Recent Labs Lab 03/06/14 0738 03/06/14 1157 03/07/14 0404 03/08/14 0410  WBC 49.0*  --  37.0* 24.3*  HGB 7.0* 8.5* 7.6* 6.3*  HCT 23.1* 25.0* 23.5* 19.3*  PLT 699*  --  602* 448*   Coag's  Recent Labs Lab 03/06/14 0554 03/07/14 0404 03/08/14 0410  APTT 42* >200* >200*  INR 1.54*  --   --    BMET  Recent Labs Lab 03/07/14 0404 03/07/14 1620 03/08/14 0410  NA 135 133* 136  K 4.2 4.0 4.1   CL 91* 93* 102  CO2 19 25 27   BUN 55* 41* 22  CREATININE 3.66* 2.75* 1.81*  GLUCOSE 49* 116* 107*   Electrolytes  Recent Labs Lab 03/07/14 0404 03/07/14 1620 03/08/14 0410  CALCIUM 8.0* 7.3* 7.4*  MG 2.1  --  2.2  PHOS 4.4  4.6 3.5 2.2*   Sepsis Markers  Recent Labs Lab 03/06/14 0800 03/06/14 1340 03/07/14 1000 03/08/14 0410  LATICACIDVEN  --  8.9* 9.3* 1.1  PROCALCITON 8.31  --   --   --    ABG  Recent Labs Lab 03/06/14 1157 03/06/14 1635 03/07/14 0310  PHART 7.243* 7.268* 7.416  PCO2ART 21.8* 26.2* 25.1*  PO2ART 68.0* 113.0* 122.0*   Liver Enzymes  Recent Labs Lab 03/06/14 0554  03/07/14 0404 03/07/14 1620 03/08/14 0410  AST 27  --   --   --   --   ALT 20  --   --   --   --   ALKPHOS 296*  --   --   --   --   BILITOT 0.3  --   --   --   --   ALBUMIN 2.6*  < > 2.0* 2.0* 1.8*  < > = values in this interval not displayed. Cardiac Enzymes  Recent Labs Lab 03/06/14 0738 03/06/14 1339 03/06/14 2220  TROPONINI <0.03 0.05* 0.05*   Glucose  Recent Labs Lab  03/07/14 1115 03/07/14 1538 03/07/14 2001 03/08/14 0035 03/08/14 0416 03/08/14 0706  GLUCAP 120* 118* 118* 140* 99 110*    Imaging Dg Chest Port 1 View  03/07/2014   CLINICAL DATA:  69 year old male with a history of respiratory failure.  EXAM: PORTABLE CHEST - 1 VIEW  COMPARISON:  Chest x-ray 03/06/2014, CT 03/06/2014  FINDINGS: Cardiomediastinal silhouette unchanged in size and contour.  Endotracheal tube appears to have been slightly advanced, previously measuring 6.2 cm from the carina, currently measuring 5.7 cm from the carina.  Gastric tube projects over the mediastinum, terminating in the left upper quadrant. The side port appears to be above the GE junction.  Right IJ central venous catheter is unchanged in position with the tip terminating over the region of the superior vena cava.  Overlying EKG leads.  Similar appearance of streaky opacities throughout the lungs, extending from  the hilar regions. No visualized pneumothorax. No large pleural effusion.  IMPRESSION: Similar appearance of the chest x-ray, with linear opacities extending from the hilar region, potentially representing atelectasis/ lung volume loss.  The endotracheal tube has been advanced slightly, terminating 5.7 cm above the carina.  Unchanged position of right IJ catheter.  Enteric tube terminates in the left upper quadrant, with the side port appearing to be above the GE junction.  Signed,  Dulcy Fanny. Earleen Newport, DO  [facility]  [facility]   Electronically Signed   By: Corrie Mckusick D.O.   On: 03/07/2014 07:35     ASSESSMENT / PLAN:  PULMONARY OETT 2/19 >>extubated 2/20 A:Acute respiratory failure due to shock  Extubated. Persistent basilar atx.  P:   pulm hygiene  Aspiration precautions Wean FIO2  CARDIOVASCULAR CVL2/19 >> A:  Septic shock -->off pressors  Right upper extremity swelling s/p IV infiltrate  P:  Keep euvolemic  Cont tele  See ID section  Limb elevation and heat   RENAL A:  AKI Hyperkalemia  Severe metabolic acidosis  P:   CRRT and electrolytes per renal Planning on stopping CRRT when the machine clots Cont strict I&O  GASTROINTESTINAL A:   Ulcerative colitis , colostomy, acute cholecystitis Mild elevation of lipase P:   Start diet  Drain placed to remain Trend lipase    HEMATOLOGIC A:  Leukemoid from sepsis Anemia of chronic illness. Hgb drifted down to < 7. No clear bleeding. May be some loss from the HD.  P:  Follow wbc further dvt prevention Sub q hep  INFECTIOUS A:  Septic shock -source is possibly UTI , wounds, acute cholecystis Sacral wound P:   BCx2 2/19 >>> Body fluid 2/19>>> Sputum2/19  Abx:  2/19 Cefepime>>> vancomycin, start date 2/19 >> Flagyl 2/20>>> Drain in place If stagnate progress, add diflucan  Cont wet to dry dressing change to sacrum Surgery considering debridement later this week.     ENDOCRINE A:  Risk for hypoglycemia   P:   SSI order set  NEUROLOGIC A:  Acute encephalopathy -related to shock and metabolic  P:   RASS goal: 0 intermittent fentanyl   FAMILY  - Updates: 2/20 sister updated  - Inter-disciplinary family meet or Palliative Care meeting due by:  2/26    TODAY'S SUMMARY: Septic shock, AKI with hyperkalemia- source could be UTI, abdomen or wounds. Clinically has tolerated extubation. No longer in shock. No positive culture data yet.     03/08/2014, 9:05 AM  STAFF NOTE:  STAFF NOTE: I, Merrie Roof, MD FACP have personally reviewed patient's available data,  including medical history, events of note, physical examination and test results as part of my evaluation. I have discussed with resident/NP and other care providers such as pharmacist, RN and RRT. In addition, I personally evaluated patient and elicited key findings of:WBC better, extubated, appears well, lactic cleared well, consider transfer once off CVVHD, BP improved, anemia, transfuse if not JW, restart synthroid, keep broad coverage further, follow cultures drain  Lavon Paganini. Titus Mould, MD, [place] Pgr: [facility] 03/08/2014 9:42 AM

## 2014-03-08 NOTE — Progress Notes (Signed)
Patient ID: Gabriel Lynch, male   DOB: [birth year], 69 y.o.   MRN: [id number]    Subjective: Extubated, stable, but still confused.    Objective: Vital signs in last 24 hours: Temp:  [95.6 F (35.3 C)-98.5 F (36.9 C)] 96.7 F (35.9 C) (02/21 0700) Pulse Rate:  [62-117] 78 (02/21 0700) Resp:  [11-24] 15 (02/21 0700) BP: (108-165)/(44-98) 133/73 mmHg (02/21 0700) SpO2:  [96 %-100 %] 100 % (02/21 0700) Weight:  [146 lb 6.2 oz (66.4 kg)] 146 lb 6.2 oz (66.4 kg) (02/21 0500) Last BM Date: 03/07/14  Intake/Output from previous day: 02/20 0701 - 02/21 0700 In: 2468.7 [I.V.:1848.7; Blood:70; IV Piggyback:550] Out: 607 [Urine:90; Drains:230; Stool:300] Intake/Output this shift: Total I/O In: -  Out: 144 [Other:144]  General appearance: on ventilator, but eyes open GI: soft, no apparent tenderness; percutaneous cholecystostomy with bilious output Ileostomy pink with soft stool output  Lab Results:   Recent Labs  03/07/14 0404 03/08/14 0410  WBC 37.0* 24.3*  HGB 7.6* 6.3*  HCT 23.5* 19.3*  PLT 602* 448*   BMET  Recent Labs  03/07/14 1620 03/08/14 0410  NA 133* 136  K 4.0 4.1  CL 93* 102  CO2 25 27  GLUCOSE 116* 107*  BUN 41* 22  CREATININE 2.75* 1.81*  CALCIUM 7.3* 7.4*   PT/INR  Recent Labs  03/06/14 0554  LABPROT 18.6*  INR 1.54*   ABG  Recent Labs  03/06/14 1635 03/07/14 0310  PHART 7.268* 7.416  HCO3 11.6* 15.9*    Studies/Results: Ct Abdomen Pelvis Wo Contrast  03/06/2014   CLINICAL DATA:  Patient unresponsive and intubated. Concern for sepsis with low body temperature. History of ulcerative colitis.  EXAM: CT CHEST, ABDOMEN AND PELVIS WITHOUT CONTRAST  TECHNIQUE: Multidetector CT imaging of the chest, abdomen and pelvis was performed following the standard protocol without IV contrast.  COMPARISON:  Chest x-ray ant [id number] hr  FINDINGS: CT CHEST FINDINGS  Endotracheal tube extends into the mid trachea. A nasogastric tube extends into the stomach.  There is no evidence of pneumothorax with lungs demonstrating mild COPD and bleb formation in the peripheral lungs bilaterally. Lower lung zones demonstrate atelectasis and probable component of bilateral lower lobe mucous plugging in the posterior lower lobe bronchi. No pleural fluid is identified.  The heart size is normal. No pericardial effusion. The thoracic aorta shows atherosclerotic plaque without aneurysmal disease. A tiny amount of calcified plaque is suspected in the distribution of the LAD and distal left circumflex coronary arteries. The thyroid gland is unremarkable. No evidence of pneumomediastinum or mediastinal mass. No lymphadenopathy identified. Bony structures are unremarkable.  CT ABDOMEN AND PELVIS FINDINGS  Acute cholecystitis is suspected with significant amount of fluid surrounding the gallbladder. Although the gallbladder is not overtly distended, there may be subtle underlying gallstones. Some free fluid tracks around the medial edge of the liver and extends up to the liver dome. There are inflammatory changes in the region of the gastrohepatic ligament and also in the left upper quadrant immediately inferior to the stomach. This appearance is nonspecific. Recommend correlation with laboratory evidence of pancreatitis. The pancreas itself appears fairly unremarkable by unenhanced CT.  There is evidence of an ileostomy in the right lower abdomen. The patient appears to be status post prior subtotal colectomy with only a rectum remaining. There is no evidence of bowel perforation or focal abscess. No free intraperitoneal air identified.  Small bowel loops are of normal caliber. No masses or lymphadenopathy identified in the abdomen or  pelvis. No hernias are seen. The bladder contains a Foley catheter with some increased density seen surrounding the Foley catheter balloon. This may be consistent with some hole line in the bladder. Correlation suggested with urinary tract. Underlying bladder  lesion cannot be excluded by unenhanced CT.  IMPRESSION: 1. Chest demonstrates mucous plug formation and atelectasis in both posterior lower lobes. There is underlying COPD. No pneumothorax is identified. 2. Mild coronary atherosclerotic disease suspected with calcified plaque in the distal LAD and left circumflex territory. 3. Evidence of probable acute cholecystitis with free fluid surrounding the gallbladder. Cholelithiasis is suspected. Ultrasound correlation may be helpful. Some free fluid tracks around the liver. 4. Nonspecific inflammatory changes in the region of the gastrohepatic ligament and inferior to the stomach. Some of this may be postoperative in nature given evidence of prior subtotal colectomy. Recommend correlation with any laboratory evidence of pancreatitis. 5. No evidence of bowel perforation or obstruction. Status post subtotal colectomy. Right-sided ileostomy site appears uncomplicated. 6. High density in the bladder adjacent to the Foley catheter balloon. This may be reflective of blood given that IV contrast was not administered. Underlying bladder lesion cannot be excluded.   Electronically Signed   By: Aletta Edouard M.D.   On: 03/06/2014 09:22   Ct Head Wo Contrast  03/06/2014   ADDENDUM REPORT: 03/06/2014 09:18  ADDENDUM: On axial slice 56 series 161, there is a 1.6 x 1.3 cm mass in the superficial soft tissues at the level of the angle of the mandible on the left. Suspect sebaceous cyst, although a focal lymph node could present in this manner.   Electronically Signed   By: Lowella Grip III M.D.   On: 03/06/2014 09:18   03/06/2014   CLINICAL DATA:  Patient unresponsive  EXAM: CT HEAD WITHOUT CONTRAST  CT CERVICAL SPINE WITHOUT CONTRAST  TECHNIQUE: Multidetector CT imaging of the head and cervical spine was performed following the standard protocol without intravenous contrast. Multiplanar CT image reconstructions of the cervical spine were also generated.  COMPARISON:  None.   FINDINGS: CT HEAD FINDINGS  The ventricles are normal in size and configuration. There is no appreciable intracranial mass, hemorrhage, extra-axial fluid collection, or midline shift. There is a probable prominent prevascular space just lateral to the inferior aspect of the posterior limb of the left internal capsule. Gray-white compartments appear normal. No acute infarct apparent. Bony calvarium appears intact. The mastoid air cells are clear. The patient has had an antrostomy on the left. There is mucosal thickening in the inferior left maxillary antrum. There is a well-circumscribed rounded bony defect in the upper anterior hard palate. 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 There is evidence of previous surgery involving the anterior aspect of the right maxillary antrum wall.  CT CERVICAL SPINE FINDINGS  There is no fracture or spondylolisthesis. Prevertebral soft tissues and predental space regions are normal. Patient is intubated.  There is moderate disc space narrowing at C4-5, C5-6, and C6-7. There is mild narrowing at C3-4. There are central disc protrusions causing impression on the ventral cord at C3-4, C4-5, and C5-6. A smaller central disc protrusion is noted at C2-3. There is a degree of spinal stenosis due to the central disc protrusions at C3-4, C4-5, and C5-6. No erosive change or bony destruction.  IMPRESSION: CT head: No intracranial mass, hemorrhage, or extra-axial fluid collection. No focal gray-white compartment lesions/acute appearing infarct. Postoperative change in the face. Prosthetic right globe. Well-circumscribed bony defect anterior hard palate. Etiology for  this defect is uncertain. It should be amenable to visual inspection.  CT cervical spine: Multifocal osteoarthritic change. There is a degree of spinal stenosis due to central disc protrusion at C3-4, C4-5, and C5-6. No fracture or spondylolisthesis. No erosive change or bony destruction.  Electronically Signed: By: Lowella Grip III M.D. On: 03/06/2014 09:14   Ct Chest Wo Contrast  03/06/2014   CLINICAL DATA:  Patient unresponsive and intubated. Concern for sepsis with low body temperature. History of ulcerative colitis.  EXAM: CT CHEST, ABDOMEN AND PELVIS WITHOUT CONTRAST  TECHNIQUE: Multidetector CT imaging of the chest, abdomen and pelvis was performed following the standard protocol without IV contrast.  COMPARISON:  Chest x-ray ant [id number] hr  FINDINGS: CT CHEST FINDINGS  Endotracheal tube extends into the mid trachea. A nasogastric tube extends into the stomach. There is no evidence of pneumothorax with lungs demonstrating mild COPD and bleb formation in the peripheral lungs bilaterally. Lower lung zones demonstrate atelectasis and probable component of bilateral lower lobe mucous plugging in the posterior lower lobe bronchi. No pleural fluid is identified.  The heart size is normal. No pericardial effusion. The thoracic aorta shows atherosclerotic plaque without aneurysmal disease. A tiny amount of calcified plaque is suspected in the distribution of the LAD and distal left circumflex coronary arteries. The thyroid gland is unremarkable. No evidence of pneumomediastinum or mediastinal mass. No lymphadenopathy identified. Bony structures are unremarkable.  CT ABDOMEN AND PELVIS FINDINGS  Acute cholecystitis is suspected with significant amount of fluid surrounding the gallbladder. Although the gallbladder is not overtly distended, there may be subtle underlying gallstones. Some free fluid tracks around the medial edge of the liver and extends up to the liver dome. There are inflammatory changes in the region of the gastrohepatic ligament and also in the left upper quadrant immediately inferior to the stomach. This appearance is nonspecific. Recommend correlation with laboratory evidence of pancreatitis. The pancreas itself appears fairly unremarkable by unenhanced CT.  There is evidence of an ileostomy in the right lower  abdomen. The patient appears to be status post prior subtotal colectomy with only a rectum remaining. There is no evidence of bowel perforation or focal abscess. No free intraperitoneal air identified.  Small bowel loops are of normal caliber. No masses or lymphadenopathy identified in the abdomen or pelvis. No hernias are seen. The bladder contains a Foley catheter with some increased density seen surrounding the Foley catheter balloon. This may be consistent with some hole line in the bladder. Correlation suggested with urinary tract. Underlying bladder lesion cannot be excluded by unenhanced CT.  IMPRESSION: 1. Chest demonstrates mucous plug formation and atelectasis in both posterior lower lobes. There is underlying COPD. No pneumothorax is identified. 2. Mild coronary atherosclerotic disease suspected with calcified plaque in the distal LAD and left circumflex territory. 3. Evidence of probable acute cholecystitis with free fluid surrounding the gallbladder. Cholelithiasis is suspected. Ultrasound correlation may be helpful. Some free fluid tracks around the liver. 4. Nonspecific inflammatory changes in the region of the gastrohepatic ligament and inferior to the stomach. Some of this may be postoperative in nature given evidence of prior subtotal colectomy. Recommend correlation with any laboratory evidence of pancreatitis. 5. No evidence of bowel perforation or obstruction. Status post subtotal colectomy. Right-sided ileostomy site appears uncomplicated. 6. High density in the bladder adjacent to the Foley catheter balloon. This may be reflective of blood given that IV contrast was not administered. Underlying bladder lesion cannot be excluded.   Electronically Signed  By: Aletta Edouard M.D.   On: 03/06/2014 09:22   Ct Cervical Spine Wo Contrast  03/06/2014   ADDENDUM REPORT: 03/06/2014 09:18  ADDENDUM: On axial slice 56 series 161, there is a 1.6 x 1.3 cm mass in the superficial soft tissues at the  level of the angle of the mandible on the left. Suspect sebaceous cyst, although a focal lymph node could present in this manner.   Electronically Signed   By: Lowella Grip III M.D.   On: 03/06/2014 09:18   03/06/2014   CLINICAL DATA:  Patient unresponsive  EXAM: CT HEAD WITHOUT CONTRAST  CT CERVICAL SPINE WITHOUT CONTRAST  TECHNIQUE: Multidetector CT imaging of the head and cervical spine was performed following the standard protocol without intravenous contrast. Multiplanar CT image reconstructions of the cervical spine were also generated.  COMPARISON:  None.  FINDINGS: CT HEAD FINDINGS  The ventricles are normal in size and configuration. There is no appreciable intracranial mass, hemorrhage, extra-axial fluid collection, or midline shift. There is a probable prominent prevascular space just lateral to the inferior aspect of the posterior limb of the left internal capsule. Gray-white compartments appear normal. No acute infarct apparent. Bony calvarium appears intact. The mastoid air cells are clear. The patient has had an antrostomy on the left. There is mucosal thickening in the inferior left maxillary antrum. There is a well-circumscribed rounded bony defect in the upper anterior hard palate. 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 There is evidence of previous surgery involving the anterior aspect of the right maxillary antrum wall.  CT CERVICAL SPINE FINDINGS  There is no fracture or spondylolisthesis. Prevertebral soft tissues and predental space regions are normal. Patient is intubated.  There is moderate disc space narrowing at C4-5, C5-6, and C6-7. There is mild narrowing at C3-4. There are central disc protrusions causing impression on the ventral cord at C3-4, C4-5, and C5-6. A smaller central disc protrusion is noted at C2-3. There is a degree of spinal stenosis due to the central disc protrusions at C3-4, C4-5, and C5-6. No erosive change or bony destruction.  IMPRESSION: CT head: No  intracranial mass, hemorrhage, or extra-axial fluid collection. No focal gray-white compartment lesions/acute appearing infarct. Postoperative change in the face. Prosthetic right globe. Well-circumscribed bony defect anterior hard palate. Etiology for this defect is uncertain. It should be amenable to visual inspection.  CT cervical spine: Multifocal osteoarthritic change. There is a degree of spinal stenosis due to central disc protrusion at C3-4, C4-5, and C5-6. No fracture or spondylolisthesis. No erosive change or bony destruction.  Electronically Signed: By: Lowella Grip III M.D. On: 03/06/2014 09:14   Ir Perc Cholecystostomy  03/06/2014   CLINICAL DATA:  Acute calculus cholecystitis  EXAM: Ultrasound percutaneous transhepatic cholecystostomy (portable)  Date:  2/19/20162/19/2016 1:52 pm  Radiologist:  Jerilynn Mages. Daryll Brod, MD  Guidance:  Ultrasound  FLUOROSCOPY TIME:  None.  MEDICATIONS AND MEDICAL HISTORY: 1% lidocaine locally. Patient is already sedated and receiving broad spectrum IV antibiotics daily.  ANESTHESIA/SEDATION: None.  CONTRAST:  None.  COMPLICATIONS: None immediate  PROCEDURE: Informed consent was obtained from the patient following explanation of the procedure, risks, benefits and alternatives. The patient understands, agrees and consents for the procedure. All questions were addressed. A time out was performed.  Maximal barrier sterile technique utilized including caps, mask, sterile gowns, sterile gloves, large sterile drape, hand hygiene, and Betadine.  Previous imaging reviewed.  At the bedside, ultrasound was performed of the right upper quadrant. The  gallbladder was localized just inferior to the right subcostal margin. There is diffuse wall thickening and edema. Gallbladder is not significantly distended. Large gallstone noted. Under sterile conditions and local anesthesia, a 21 gauge needle was advanced percutaneously through a transhepatic windowed into the gallbladder. Needle  position confirmed with ultrasound. Images obtained for documentation. Guidewire advanced into the gallbladder. Guidewire position confirmed with ultrasound. Accustick dilator set advanced. Guidewire exchanged for an Amplatz guidewire. Guidewire again confirmed within the gallbladder with ultrasound. Tract dilatation performed to advance a 10 [place] drain. Retention loop formed in the gallbladder. Syringe aspiration yielded thick bile. Sample sent for Gram stain culture. Catheter secured with a Prolene suture and connected to external gravity drainage. Sterile dressing applied. No Immediate complication. Patient tolerated the procedure well.  IMPRESSION: Successful ultrasound percutaneous transhepatic cholecystostomy.   Electronically Signed   By: Jerilynn Mages.  Shick M.D.   On: 03/06/2014 14:13   Dg Chest Port 1 View  03/07/2014   CLINICAL DATA:  69 year old male with a history of respiratory failure.  EXAM: PORTABLE CHEST - 1 VIEW  COMPARISON:  Chest x-ray 03/06/2014, CT 03/06/2014  FINDINGS: Cardiomediastinal silhouette unchanged in size and contour.  Endotracheal tube appears to have been slightly advanced, previously measuring 6.2 cm from the carina, currently measuring 5.7 cm from the carina.  Gastric tube projects over the mediastinum, terminating in the left upper quadrant. The side port appears to be above the GE junction.  Right IJ central venous catheter is unchanged in position with the tip terminating over the region of the superior vena cava.  Overlying EKG leads.  Similar appearance of streaky opacities throughout the lungs, extending from the hilar regions. No visualized pneumothorax. No large pleural effusion.  IMPRESSION: Similar appearance of the chest x-ray, with linear opacities extending from the hilar region, potentially representing atelectasis/ lung volume loss.  The endotracheal tube has been advanced slightly, terminating 5.7 cm above the carina.  Unchanged position of right IJ catheter.  Enteric  tube terminates in the left upper quadrant, with the side port appearing to be above the GE junction.  Signed,  Dulcy Fanny. Earleen Newport, DO  [facility]  [facility]   Electronically Signed   By: Corrie Mckusick D.O.   On: 03/07/2014 07:35   Dg Chest Port 1 View  03/06/2014   CLINICAL DATA:  Central catheter placement  EXAM: PORTABLE CHEST - 1 VIEW  COMPARISON:  Chest radiograph and chest CT obtained earlier in the day  FINDINGS: Central catheter tip is in the superior vena cava. Endotracheal tube tip is 4.7 cm above the carina. Nasogastric tube tip and side port are in the stomach. No pneumothorax. There is patchy bibasilar atelectatic change. Elsewhere lungs are clear. Heart size and pulmonary vascularity are normal. No adenopathy.  IMPRESSION: Tube and catheter positions as described without pneumothorax. Patchy bibasilar atelectatic change. Lungs elsewhere clear. No change in cardiac silhouette.   Electronically Signed   By: Lowella Grip III M.D.   On: 03/06/2014 10:01    Anti-infectives: Anti-infectives    Start     Dose/Rate Route Frequency Ordered Stop   03/07/14 1200  metroNIDAZOLE (FLAGYL) IVPB 500 mg     500 mg 100 mL/hr over 60 Minutes Intravenous Every 8 hours 03/07/14 0940     03/07/14 0600  vancomycin (VANCOCIN) IVPB 750 mg/150 ml premix     750 mg 150 mL/hr over 60 Minutes Intravenous Every 24 hours 03/06/14 1311     03/06/14 2200  ceFEPIme (MAXIPIME) 2 g in dextrose 5 % 50 mL IVPB     2 g 100 mL/hr over 30 Minutes Intravenous Every 12 hours 03/06/14 1311     03/06/14 1230  piperacillin-tazobactam (ZOSYN) IVPB 3.375 g     3.375 g 100 mL/hr over 30 Minutes Intravenous  Once 03/06/14 1200 03/06/14 1316   03/06/14 1200  piperacillin-tazobactam (ZOSYN) IVPB 3.375 g  Status:  Discontinued     3.375 g 12.5 mL/hr over 240 Minutes Intravenous  Once 03/06/14 1156 03/06/14 1159   03/06/14 0630  ceFEPIme (MAXIPIME) 1 g in dextrose 5 % 50 mL IVPB      1 g 100 mL/hr over 30 Minutes Intravenous  Once 03/06/14 0619 03/06/14 0703   03/06/14 0630  vancomycin (VANCOCIN) 1,250 mg in sodium chloride 0.9 % 250 mL IVPB     1,250 mg 166.7 mL/hr over 90 Minutes Intravenous  Once 03/06/14 5400 03/06/14 0810      Assessment/Plan: s/p percutaneous cholecystostomy for acute cholecystitis  Clinically improving. Recheck wound Monday to ascertain whether needs additional debridement.     LOS: 2 days    [facility] 03/08/2014

## 2014-03-08 NOTE — Progress Notes (Signed)
Subjective:  Events noted- extubated- hgb 6.3 this AM- getting transfused - awake, answers simple questions Objective Vital signs in last 24 hours: Filed Vitals:   03/08/14 0633 03/08/14 0645 03/08/14 0648 03/08/14 0700  BP:  128/69 137/60 133/73  Pulse: 68 76 62 78  Temp: 96.6 F (35.9 C) 96.7 F (35.9 C) 96.7 F (35.9 C) 96.7 F (35.9 C)  TempSrc: Core (Comment) Core (Comment) Core (Comment) Core (Comment)  Resp: 14 15 13 15   Height:      Weight:      SpO2: 100% 97% 100% 100%   Weight change: 6 kg (13 lb 3.6 oz)  Intake/Output Summary (Last 24 hours) at 03/08/14 8295 Last data filed at 03/08/14 0700  Gross per 24 hour  Intake 2468.67 ml  Output    752 ml  Net 1716.67 ml    Assessment/ Plan: Pt is a 69 y.o. yo male NH resident with ulcerative colitis/colostomy and wound vac to deep sacral decub who was admitted on 03/06/2014 with  Septic shock- cholecystitis and AKI  Assessment/Plan: 1. Renal- AKI- oliguric- due to septic shock/ATN- requiring CRRT AOZHY8/65 - metabolically much improved as well as hemodynamics.  Given the fact that his hgb dropped so much plan to take heparin out of CRRT and then just stop it when he clots the machine.  Really do not know his renal function baseline and his kidneys took a pretty big hit, so will need to see how things go with UOP and labs but if needs HD again, should be able to tolerate intermittent HD  2. Sepsis- appears to be due to cholecystitis- s/p chole tube and maxipime/zosyn and vanc- markedly improved 3. Anemia-  transfusion- per CCM- will stop heparin in CRRT 4. HTN/volume- seems euvolemic to me- not sure what the etiology is of his swollen left arm.  Planning to stop CRRT today, will follow  Jerald Hennington A    Labs: Basic Metabolic Panel:  Recent Labs Lab 03/07/14 0404 03/07/14 1620 03/08/14 0410  NA 135 133* 136  K 4.2 4.0 4.1  CL 91* 93* 102  CO2 19 25 27   GLUCOSE 49* 116* 107*  BUN 55* 41* 22  CREATININE 3.66*  2.75* 1.81*  CALCIUM 8.0* 7.3* 7.4*  PHOS 4.4  4.6 3.5 2.2*   Liver Function Tests:  Recent Labs Lab 03/06/14 0554  03/07/14 0404 03/07/14 1620 03/08/14 0410  AST 27  --   --   --   --   ALT 20  --   --   --   --   ALKPHOS 296*  --   --   --   --   BILITOT 0.3  --   --   --   --   PROT 7.6  --   --   --   --   ALBUMIN 2.6*  < > 2.0* 2.0* 1.8*  < > = values in this interval not displayed.  Recent Labs Lab 03/07/14 1000  LIPASE 193*   No results for input(s): AMMONIA in the last 168 hours. CBC:  Recent Labs Lab 03/06/14 0554 03/06/14 0738 03/06/14 1157 03/07/14 0404 03/08/14 0410  WBC 52.6* 49.0*  --  37.0* 24.3*  NEUTROABS 46.3*  --   --   --   --   HGB 8.8* 7.0* 8.5* 7.6* 6.3*  HCT 30.8* 23.1* 25.0* 23.5* 19.3*  MCV 95.4 91.7  --  84.8 84.6  PLT 1036* 699*  --  602* 448*   Cardiac Enzymes:  Recent Labs Lab 03/06/14 0738 03/06/14 1339 03/06/14 2220  TROPONINI <0.03 0.05* 0.05*   CBG:  Recent Labs Lab 03/07/14 1115 03/07/14 1538 03/07/14 2001 03/08/14 0035 03/08/14 0416  GLUCAP 120* 118* 118* 140* 99    Iron Studies: No results for input(s): IRON, TIBC, TRANSFERRIN, FERRITIN in the last 72 hours. Studies/Results: Ct Abdomen Pelvis Wo Contrast  03/06/2014   CLINICAL DATA:  Patient unresponsive and intubated. Concern for sepsis with low body temperature. History of ulcerative colitis.  EXAM: CT CHEST, ABDOMEN AND PELVIS WITHOUT CONTRAST  TECHNIQUE: Multidetector CT imaging of the chest, abdomen and pelvis was performed following the standard protocol without IV contrast.  COMPARISON:  Chest x-ray ant [phone] hr  FINDINGS: CT CHEST FINDINGS  Endotracheal tube extends into the mid trachea. A nasogastric tube extends into the stomach. There is no evidence of pneumothorax with lungs demonstrating mild COPD and bleb formation in the peripheral lungs bilaterally. Lower lung zones demonstrate atelectasis and probable component of bilateral lower lobe mucous  plugging in the posterior lower lobe bronchi. No pleural fluid is identified.  The heart size is normal. No pericardial effusion. The thoracic aorta shows atherosclerotic plaque without aneurysmal disease. A tiny amount of calcified plaque is suspected in the distribution of the LAD and distal left circumflex coronary arteries. The thyroid gland is unremarkable. No evidence of pneumomediastinum or mediastinal mass. No lymphadenopathy identified. Bony structures are unremarkable.  CT ABDOMEN AND PELVIS FINDINGS  Acute cholecystitis is suspected with significant amount of fluid surrounding the gallbladder. Although the gallbladder is not overtly distended, there may be subtle underlying gallstones. Some free fluid tracks around the medial edge of the liver and extends up to the liver dome. There are inflammatory changes in the region of the gastrohepatic ligament and also in the left upper quadrant immediately inferior to the stomach. This appearance is nonspecific. Recommend correlation with laboratory evidence of pancreatitis. The pancreas itself appears fairly unremarkable by unenhanced CT.  There is evidence of an ileostomy in the right lower abdomen. The patient appears to be status post prior subtotal colectomy with only a rectum remaining. There is no evidence of bowel perforation or focal abscess. No free intraperitoneal air identified.  Small bowel loops are of normal caliber. No masses or lymphadenopathy identified in the abdomen or pelvis. No hernias are seen. The bladder contains a Foley catheter with some increased density seen surrounding the Foley catheter balloon. This may be consistent with some hole line in the bladder. Correlation suggested with urinary tract. Underlying bladder lesion cannot be excluded by unenhanced CT.  IMPRESSION: 1. Chest demonstrates mucous plug formation and atelectasis in both posterior lower lobes. There is underlying COPD. No pneumothorax is identified. 2. Mild coronary  atherosclerotic disease suspected with calcified plaque in the distal LAD and left circumflex territory. 3. Evidence of probable acute cholecystitis with free fluid surrounding the gallbladder. Cholelithiasis is suspected. Ultrasound correlation may be helpful. Some free fluid tracks around the liver. 4. Nonspecific inflammatory changes in the region of the gastrohepatic ligament and inferior to the stomach. Some of this may be postoperative in nature given evidence of prior subtotal colectomy. Recommend correlation with any laboratory evidence of pancreatitis. 5. No evidence of bowel perforation or obstruction. Status post subtotal colectomy. Right-sided ileostomy site appears uncomplicated. 6. High density in the bladder adjacent to the Foley catheter balloon. This may be reflective of blood given that IV contrast was not administered. Underlying bladder lesion cannot be excluded.   Electronically Signed  By: Aletta Edouard M.D.   On: 03/06/2014 09:22   Ct Head Wo Contrast  03/06/2014   ADDENDUM REPORT: 03/06/2014 09:18  ADDENDUM: On axial slice 56 series 462, there is a 1.6 x 1.3 cm mass in the superficial soft tissues at the level of the angle of the mandible on the left. Suspect sebaceous cyst, although a focal lymph node could present in this manner.   Electronically Signed   By: Lowella Grip III M.D.   On: 03/06/2014 09:18   03/06/2014   CLINICAL DATA:  Patient unresponsive  EXAM: CT HEAD WITHOUT CONTRAST  CT CERVICAL SPINE WITHOUT CONTRAST  TECHNIQUE: Multidetector CT imaging of the head and cervical spine was performed following the standard protocol without intravenous contrast. Multiplanar CT image reconstructions of the cervical spine were also generated.  COMPARISON:  None.  FINDINGS: CT HEAD FINDINGS  The ventricles are normal in size and configuration. There is no appreciable intracranial mass, hemorrhage, extra-axial fluid collection, or midline shift. There is a probable prominent  prevascular space just lateral to the inferior aspect of the posterior limb of the left internal capsule. Gray-white compartments appear normal. No acute infarct apparent. Bony calvarium appears intact. The mastoid air cells are clear. The patient has had an antrostomy on the left. There is mucosal thickening in the inferior left maxillary antrum. There is a well-circumscribed rounded bony defect in the upper anterior hard palate. 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 There is evidence of previous surgery involving the anterior aspect of the right maxillary antrum wall.  CT CERVICAL SPINE FINDINGS  There is no fracture or spondylolisthesis. Prevertebral soft tissues and predental space regions are normal. Patient is intubated.  There is moderate disc space narrowing at C4-5, C5-6, and C6-7. There is mild narrowing at C3-4. There are central disc protrusions causing impression on the ventral cord at C3-4, C4-5, and C5-6. A smaller central disc protrusion is noted at C2-3. There is a degree of spinal stenosis due to the central disc protrusions at C3-4, C4-5, and C5-6. No erosive change or bony destruction.  IMPRESSION: CT head: No intracranial mass, hemorrhage, or extra-axial fluid collection. No focal gray-white compartment lesions/acute appearing infarct. Postoperative change in the face. Prosthetic right globe. Well-circumscribed bony defect anterior hard palate. Etiology for this defect is uncertain. It should be amenable to visual inspection.  CT cervical spine: Multifocal osteoarthritic change. There is a degree of spinal stenosis due to central disc protrusion at C3-4, C4-5, and C5-6. No fracture or spondylolisthesis. No erosive change or bony destruction.  Electronically Signed: By: Lowella Grip III M.D. On: 03/06/2014 09:14   Ct Chest Wo Contrast  03/06/2014   CLINICAL DATA:  Patient unresponsive and intubated. Concern for sepsis with low body temperature. History of ulcerative colitis.  EXAM: CT  CHEST, ABDOMEN AND PELVIS WITHOUT CONTRAST  TECHNIQUE: Multidetector CT imaging of the chest, abdomen and pelvis was performed following the standard protocol without IV contrast.  COMPARISON:  Chest x-ray ant [phone] hr  FINDINGS: CT CHEST FINDINGS  Endotracheal tube extends into the mid trachea. A nasogastric tube extends into the stomach. There is no evidence of pneumothorax with lungs demonstrating mild COPD and bleb formation in the peripheral lungs bilaterally. Lower lung zones demonstrate atelectasis and probable component of bilateral lower lobe mucous plugging in the posterior lower lobe bronchi. No pleural fluid is identified.  The heart size is normal. No pericardial effusion. The thoracic aorta shows atherosclerotic plaque without aneurysmal disease. A  tiny amount of calcified plaque is suspected in the distribution of the LAD and distal left circumflex coronary arteries. The thyroid gland is unremarkable. No evidence of pneumomediastinum or mediastinal mass. No lymphadenopathy identified. Bony structures are unremarkable.  CT ABDOMEN AND PELVIS FINDINGS  Acute cholecystitis is suspected with significant amount of fluid surrounding the gallbladder. Although the gallbladder is not overtly distended, there may be subtle underlying gallstones. Some free fluid tracks around the medial edge of the liver and extends up to the liver dome. There are inflammatory changes in the region of the gastrohepatic ligament and also in the left upper quadrant immediately inferior to the stomach. This appearance is nonspecific. Recommend correlation with laboratory evidence of pancreatitis. The pancreas itself appears fairly unremarkable by unenhanced CT.  There is evidence of an ileostomy in the right lower abdomen. The patient appears to be status post prior subtotal colectomy with only a rectum remaining. There is no evidence of bowel perforation or focal abscess. No free intraperitoneal air identified.  Small bowel loops  are of normal caliber. No masses or lymphadenopathy identified in the abdomen or pelvis. No hernias are seen. The bladder contains a Foley catheter with some increased density seen surrounding the Foley catheter balloon. This may be consistent with some hole line in the bladder. Correlation suggested with urinary tract. Underlying bladder lesion cannot be excluded by unenhanced CT.  IMPRESSION: 1. Chest demonstrates mucous plug formation and atelectasis in both posterior lower lobes. There is underlying COPD. No pneumothorax is identified. 2. Mild coronary atherosclerotic disease suspected with calcified plaque in the distal LAD and left circumflex territory. 3. Evidence of probable acute cholecystitis with free fluid surrounding the gallbladder. Cholelithiasis is suspected. Ultrasound correlation may be helpful. Some free fluid tracks around the liver. 4. Nonspecific inflammatory changes in the region of the gastrohepatic ligament and inferior to the stomach. Some of this may be postoperative in nature given evidence of prior subtotal colectomy. Recommend correlation with any laboratory evidence of pancreatitis. 5. No evidence of bowel perforation or obstruction. Status post subtotal colectomy. Right-sided ileostomy site appears uncomplicated. 6. High density in the bladder adjacent to the Foley catheter balloon. This may be reflective of blood given that IV contrast was not administered. Underlying bladder lesion cannot be excluded.   Electronically Signed   By: Aletta Edouard M.D.   On: 03/06/2014 09:22   Ct Cervical Spine Wo Contrast  03/06/2014   ADDENDUM REPORT: 03/06/2014 09:18  ADDENDUM: On axial slice 56 series 254, there is a 1.6 x 1.3 cm mass in the superficial soft tissues at the level of the angle of the mandible on the left. Suspect sebaceous cyst, although a focal lymph node could present in this manner.   Electronically Signed   By: Lowella Grip III M.D.   On: 03/06/2014 09:18   03/06/2014    CLINICAL DATA:  Patient unresponsive  EXAM: CT HEAD WITHOUT CONTRAST  CT CERVICAL SPINE WITHOUT CONTRAST  TECHNIQUE: Multidetector CT imaging of the head and cervical spine was performed following the standard protocol without intravenous contrast. Multiplanar CT image reconstructions of the cervical spine were also generated.  COMPARISON:  None.  FINDINGS: CT HEAD FINDINGS  The ventricles are normal in size and configuration. There is no appreciable intracranial mass, hemorrhage, extra-axial fluid collection, or midline shift. There is a probable prominent prevascular space just lateral to the inferior aspect of the posterior limb of the left internal capsule. Gray-white compartments appear normal. No acute infarct apparent. Bony  calvarium appears intact. The mastoid air cells are clear. The patient has had an antrostomy on the left. There is mucosal thickening in the inferior left maxillary antrum. There is a well-circumscribed rounded bony defect in the upper anterior hard palate. 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 There is evidence of previous surgery involving the anterior aspect of the right maxillary antrum wall.  CT CERVICAL SPINE FINDINGS  There is no fracture or spondylolisthesis. Prevertebral soft tissues and predental space regions are normal. Patient is intubated.  There is moderate disc space narrowing at C4-5, C5-6, and C6-7. There is mild narrowing at C3-4. There are central disc protrusions causing impression on the ventral cord at C3-4, C4-5, and C5-6. A smaller central disc protrusion is noted at C2-3. There is a degree of spinal stenosis due to the central disc protrusions at C3-4, C4-5, and C5-6. No erosive change or bony destruction.  IMPRESSION: CT head: No intracranial mass, hemorrhage, or extra-axial fluid collection. No focal gray-white compartment lesions/acute appearing infarct. Postoperative change in the face. Prosthetic right globe. Well-circumscribed bony defect anterior hard  palate. Etiology for this defect is uncertain. It should be amenable to visual inspection.  CT cervical spine: Multifocal osteoarthritic change. There is a degree of spinal stenosis due to central disc protrusion at C3-4, C4-5, and C5-6. No fracture or spondylolisthesis. No erosive change or bony destruction.  Electronically Signed: By: Lowella Grip III M.D. On: 03/06/2014 09:14   Ir Perc Cholecystostomy  03/06/2014   CLINICAL DATA:  Acute calculus cholecystitis  EXAM: Ultrasound percutaneous transhepatic cholecystostomy (portable)  Date:  2/19/20162/19/2016 1:52 pm  Radiologist:  Jerilynn Mages. Daryll Brod, MD  Guidance:  Ultrasound  FLUOROSCOPY TIME:  None.  MEDICATIONS AND MEDICAL HISTORY: 1% lidocaine locally. Patient is already sedated and receiving broad spectrum IV antibiotics daily.  ANESTHESIA/SEDATION: None.  CONTRAST:  None.  COMPLICATIONS: None immediate  PROCEDURE: Informed consent was obtained from the patient following explanation of the procedure, risks, benefits and alternatives. The patient understands, agrees and consents for the procedure. All questions were addressed. A time out was performed.  Maximal barrier sterile technique utilized including caps, mask, sterile gowns, sterile gloves, large sterile drape, hand hygiene, and Betadine.  Previous imaging reviewed.  At the bedside, ultrasound was performed of the right upper quadrant. The gallbladder was localized just inferior to the right subcostal margin. There is diffuse wall thickening and edema. Gallbladder is not significantly distended. Large gallstone noted. Under sterile conditions and local anesthesia, a 21 gauge needle was advanced percutaneously through a transhepatic windowed into the gallbladder. Needle position confirmed with ultrasound. Images obtained for documentation. Guidewire advanced into the gallbladder. Guidewire position confirmed with ultrasound. Accustick dilator set advanced. Guidewire exchanged for an Amplatz guidewire.  Guidewire again confirmed within the gallbladder with ultrasound. Tract dilatation performed to advance a 10 [place] drain. Retention loop formed in the gallbladder. Syringe aspiration yielded thick bile. Sample sent for Gram stain culture. Catheter secured with a Prolene suture and connected to external gravity drainage. Sterile dressing applied. No Immediate complication. Patient tolerated the procedure well.  IMPRESSION: Successful ultrasound percutaneous transhepatic cholecystostomy.   Electronically Signed   By: Jerilynn Mages.  Shick M.D.   On: 03/06/2014 14:13   Dg Chest Port 1 View  03/07/2014   CLINICAL DATA:  69 year old male with a history of respiratory failure.  EXAM: PORTABLE CHEST - 1 VIEW  COMPARISON:  Chest x-ray 03/06/2014, CT 03/06/2014  FINDINGS: Cardiomediastinal silhouette unchanged in size and contour.  Endotracheal tube  appears to have been slightly advanced, previously measuring 6.2 cm from the carina, currently measuring 5.7 cm from the carina.  Gastric tube projects over the mediastinum, terminating in the left upper quadrant. The side port appears to be above the GE junction.  Right IJ central venous catheter is unchanged in position with the tip terminating over the region of the superior vena cava.  Overlying EKG leads.  Similar appearance of streaky opacities throughout the lungs, extending from the hilar regions. No visualized pneumothorax. No large pleural effusion.  IMPRESSION: Similar appearance of the chest x-ray, with linear opacities extending from the hilar region, potentially representing atelectasis/ lung volume loss.  The endotracheal tube has been advanced slightly, terminating 5.7 cm above the carina.  Unchanged position of right IJ catheter.  Enteric tube terminates in the left upper quadrant, with the side port appearing to be above the GE junction.  Signed,  Dulcy Fanny. Earleen Newport, DO  [facility]  [facility]   Electronically Signed   By:  Corrie Mckusick D.O.   On: 03/07/2014 07:35   Dg Chest Port 1 View  03/06/2014   CLINICAL DATA:  Central catheter placement  EXAM: PORTABLE CHEST - 1 VIEW  COMPARISON:  Chest radiograph and chest CT obtained earlier in the day  FINDINGS: Central catheter tip is in the superior vena cava. Endotracheal tube tip is 4.7 cm above the carina. Nasogastric tube tip and side port are in the stomach. No pneumothorax. There is patchy bibasilar atelectatic change. Elsewhere lungs are clear. Heart size and pulmonary vascularity are normal. No adenopathy.  IMPRESSION: Tube and catheter positions as described without pneumothorax. Patchy bibasilar atelectatic change. Lungs elsewhere clear. No change in cardiac silhouette.   Electronically Signed   By: Lowella Grip III M.D.   On: 03/06/2014 10:01   Medications: Infusions: . sodium chloride    . sodium chloride    . fentaNYL infusion INTRAVENOUS 150 mcg/hr (03/07/14 0537)  . heparin 10,000 units/ 20 mL infusion syringe 2,200 Units/hr (03/08/14 0422)  . norepinephrine (LEVOPHED) Adult infusion Stopped (03/07/14 0100)  . dialysis replacement fluid (prismasate) 300 mL/hr at 03/07/14 0919  . dialysis replacement fluid (prismasate) 400 mL/hr at 03/07/14 1920  . dialysate (PRISMASATE) 2,000 mL/hr at 03/08/14 0501  . vasopressin (PITRESSIN) infusion - *FOR SHOCK* Stopped (03/06/14 2340)    Scheduled Medications: . sodium chloride   Intravenous Once  . antiseptic oral rinse  7 mL Mouth Rinse QID  . ceFEPime (MAXIPIME) IV  2 g Intravenous Q12H  . chlorhexidine  15 mL Mouth Rinse BID  . Chlorhexidine Gluconate Cloth  6 each Topical Q0600  . insulin aspart  0-15 Units Subcutaneous 6 times per day  . metronidazole  500 mg Intravenous Q8H  . mupirocin ointment  1 application Nasal BID  . pantoprazole (PROTONIX) IV  40 mg Intravenous QHS  . sodium chloride  1,000 mL Intravenous Once  . sodium chloride  500 mL Intravenous Once  . vancomycin  750 mg Intravenous Q24H     have reviewed scheduled and prn medications.  Physical Exam: General:more alert but no commands Heart: RRR Lungs: mostly clear Abdomen: distended- chole tube in place Extremities: no edema--- reported sacral decub to the bone- has edema only to left arm with blistering- otherwise no edema Dialysis Access: vascath placed 2/19    03/08/2014,7:14 AM  LOS: 2 days

## 2014-03-08 NOTE — Progress Notes (Signed)
CRITICAL VALUE ALERT  Critical value received:  Hgb= 6.3 and PTT>200  Date of notification:  03/08/14  Time of notification:  7001 and 0534  Critical value read back:Yes.    Nurse who received alert:  Delphia Grates  MD notified (1st page):  Dr Bethann Humble  Time of first page:  0600  MD notified (2nd page):  Time of second page:  Responding MD:  Dr Bethann Humble  Time MD responded:  0600

## 2014-03-08 NOTE — Progress Notes (Signed)
[place] Progress Note Patient Name: Stanley Lyness DOB: 1945-12-18 MRN: [id number]   Date of Service  03/08/2014  HPI/Events of Note  Hgb 6.3 this am  eICU Interventions  Transfuse 2 unit PRBC's     Intervention Category Intermediate Interventions: Bleeding - evaluation and treatment with blood products  Mauri Brooklyn, P 03/08/2014, 5:38 AM

## 2014-03-08 NOTE — Evaluation (Signed)
Clinical/Bedside Swallow Evaluation Patient Details  Name: Gabriel Lynch MRN: [id number] Date of Birth: September 24, 1945  Today's Date: 03/08/2014 Time: SLP Start Time (ACUTE ONLY): 0903 SLP Stop Time (ACUTE ONLY): 0925 SLP Time Calculation (min) (ACUTE ONLY): 22 min  Past Medical History:  Past Medical History  Diagnosis Date  . Ulcerative colitis   . Diabetes mellitus without complication   . Hypertension   . Iron deficiency anemia   . Hypothyroidism    Past Surgical History:  Past Surgical History  Procedure Laterality Date  . Back surgery      multiple surgeries per family  . 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 Appendectomy    . Debridement of sacral decubitus ulcer  01-26-14   HPI:  69 year old, nursing home resident, with ulcerative colitis/colostomy and wound VAC to sacral wound, BIBEMS unresponsive, with septic shock and AKI. Pt intubated 2/19-2/20.   Assessment / Plan / Recommendation Clinical Impression  Pt has a cognitively based oral dysphagia, with no overt signs of aspiration observed across consistencies. He requires Max cues for mandibular opening and oral acceptance of all consistencies - those he did and did not want to eat. Mild residuals remained in his oral cavity with trials of very small bites of regular textures. Pt would not take larger bites for assessment at this time despite cueing from SLP. Recommend to initiate Dys 2 diet and thin liquids. SLP to f/u for tolerance - expect good prognosis for solid advancement as mentation clears.    Aspiration Risk  Moderate    Diet Recommendation Dysphagia 2 (Fine chop);Thin liquid   Liquid Administration via: Cup;Straw Medication Administration: Whole meds with liquid Supervision: Patient able to self feed;Full supervision/cueing for compensatory strategies Compensations: Slow rate;Small sips/bites;Check for pocketing Postural Changes and/or Swallow Maneuvers: Seated upright 90 degrees    Other   Recommendations Oral Care Recommendations: Oral care BID   Follow Up Recommendations   (tba)    Frequency and Duration min 2x/week  2 weeks   Pertinent Vitals/Pain [facility]    SLP Swallow Goals     Swallow Study Prior Functional Status       General HPI: 69 year old, nursing home resident, with ulcerative colitis/colostomy and wound VAC to sacral wound, BIBEMS unresponsive, with septic shock and AKI. Pt intubated 2/19-2/20. Type of Study: Bedside swallow evaluation Previous Swallow Assessment: none in chart Diet Prior to this Study: NPO Temperature Spikes Noted: No Respiratory Status: Nasal cannula History of Recent Intubation: Yes Length of Intubations (days): 2 days Date extubated: 03/07/14 Behavior/Cognition: Alert;Cooperative;Confused;Requires cueing Oral Cavity - Dentition: Adequate natural dentition Patient Positioning: Upright in bed Baseline Vocal Quality: Clear    Oral/Motor/Sensory Function Overall Oral Motor/Sensory Function: Appears within functional limits for tasks assessed   Ice Chips Ice chips: Impaired Presentation: Spoon Oral Phase Impairments: Other (comment) (decreased bolus acceptance)   Thin Liquid Thin Liquid: Within functional limits Presentation: Straw    Nectar Thick Nectar Thick Liquid: Not tested   Honey Thick Honey Thick Liquid: Not tested   Puree Puree: Impaired Presentation: Spoon Oral Phase Impairments: Impaired anterior to posterior transit;Poor awareness of bolus   Solid       Solid: Impaired Oral Phase Impairments: Impaired anterior to posterior transit Oral Phase Functional Implications: Oral residue      Germain Osgood, M.A. CCC-SLP [phone]  Germain Osgood 03/08/2014,9:38 AM

## 2014-03-08 NOTE — Progress Notes (Signed)
Subjective: Pt now extubated Confused  Objective: Physical Exam: BP 133/73 mmHg  Pulse 78  Temp(Src) 96.7 F (35.9 C) (Core (Comment))  Resp 15  Ht 5\' 10"  (1.778 m)  Wt 146 lb 6.2 oz (66.4 kg)  BMI 21.00 kg/m2  SpO2 100% Abd: soft. RUQ perc chole drain intact. Bilious output >248mL output per day   Labs: CBC  Recent Labs  03/07/14 0404 03/08/14 0410  WBC 37.0* 24.3*  HGB 7.6* 6.3*  HCT 23.5* 19.3*  PLT 602* 448*   BMET  Recent Labs  03/07/14 1620 03/08/14 0410  NA 133* 136  K 4.0 4.1  CL 93* 102  CO2 25 27  GLUCOSE 116* 107*  BUN 41* 22  CREATININE 2.75* 1.81*  CALCIUM 7.3* 7.4*   LFT  Recent Labs  03/06/14 0554  03/07/14 1000  03/08/14 0410  PROT 7.6  --   --   --   --   ALBUMIN 2.6*  < >  --   < > 1.8*  AST 27  --   --   --   --   ALT 20  --   --   --   --   ALKPHOS 296*  --   --   --   --   BILITOT 0.3  --   --   --   --   LIPASE  --   --  193*  --   --   < > = values in this interval not displayed. PT/INR  Recent Labs  03/06/14 0554  LABPROT 18.6*  INR 1.54*     Studies/Results: Ir Perc Cholecystostomy  03/06/2014   CLINICAL DATA:  Acute calculus cholecystitis  EXAM: Ultrasound percutaneous transhepatic cholecystostomy (portable)  Date:  2/19/20162/19/2016 1:52 pm  Radiologist:  M. Daryll Brod, MD  Guidance:  Ultrasound  FLUOROSCOPY TIME:  None.  MEDICATIONS AND MEDICAL HISTORY: 1% lidocaine locally. Patient is already sedated and receiving broad spectrum IV antibiotics daily.  ANESTHESIA/SEDATION: None.  CONTRAST:  None.  COMPLICATIONS: None immediate  PROCEDURE: Informed consent was obtained from the patient following explanation of the procedure, risks, benefits and alternatives. The patient understands, agrees and consents for the procedure. All questions were addressed. A time out was performed.  Maximal barrier sterile technique utilized including caps, mask, sterile gowns, sterile gloves, large sterile drape, hand hygiene, and  Betadine.  Previous imaging reviewed.  At the bedside, ultrasound was performed of the right upper quadrant. The gallbladder was localized just inferior to the right subcostal margin. There is diffuse wall thickening and edema. Gallbladder is not significantly distended. Large gallstone noted. Under sterile conditions and local anesthesia, a 21 gauge needle was advanced percutaneously through a transhepatic windowed into the gallbladder. Needle position confirmed with ultrasound. Images obtained for documentation. Guidewire advanced into the gallbladder. Guidewire position confirmed with ultrasound. Accustick dilator set advanced. Guidewire exchanged for an Amplatz guidewire. Guidewire again confirmed within the gallbladder with ultrasound. Tract dilatation performed to advance a 10 [place] drain. Retention loop formed in the gallbladder. Syringe aspiration yielded thick bile. Sample sent for Gram stain culture. Catheter secured with a Prolene suture and connected to external gravity drainage. Sterile dressing applied. No Immediate complication. Patient tolerated the procedure well.  IMPRESSION: Successful ultrasound percutaneous transhepatic cholecystostomy.   Electronically Signed   By: Jerilynn Mages.  Shick M.D.   On: 03/06/2014 14:13   Dg Chest Port 1 View  03/07/2014   CLINICAL DATA:  69 year old male with a history of respiratory failure.  EXAM: PORTABLE CHEST - 1 VIEW  COMPARISON:  Chest x-ray 03/06/2014, CT 03/06/2014  FINDINGS: Cardiomediastinal silhouette unchanged in size and contour.  Endotracheal tube appears to have been slightly advanced, previously measuring 6.2 cm from the carina, currently measuring 5.7 cm from the carina.  Gastric tube projects over the mediastinum, terminating in the left upper quadrant. The side port appears to be above the GE junction.  Right IJ central venous catheter is unchanged in position with the tip terminating over the region of the superior vena cava.  Overlying EKG leads.   Similar appearance of streaky opacities throughout the lungs, extending from the hilar regions. No visualized pneumothorax. No large pleural effusion.  IMPRESSION: Similar appearance of the chest x-ray, with linear opacities extending from the hilar region, potentially representing atelectasis/ lung volume loss.  The endotracheal tube has been advanced slightly, terminating 5.7 cm above the carina.  Unchanged position of right IJ catheter.  Enteric tube terminates in the left upper quadrant, with the side port appearing to be above the GE junction.  Signed,  Dulcy Fanny. Earleen Newport, DO  [facility]  [facility]   Electronically Signed   By: Corrie Mckusick D.O.   On: 03/07/2014 07:35   Dg Chest Port 1 View  03/06/2014   CLINICAL DATA:  Central catheter placement  EXAM: PORTABLE CHEST - 1 VIEW  COMPARISON:  Chest radiograph and chest CT obtained earlier in the day  FINDINGS: Central catheter tip is in the superior vena cava. Endotracheal tube tip is 4.7 cm above the carina. Nasogastric tube tip and side port are in the stomach. No pneumothorax. There is patchy bibasilar atelectatic change. Elsewhere lungs are clear. Heart size and pulmonary vascularity are normal. No adenopathy.  IMPRESSION: Tube and catheter positions as described without pneumothorax. Patchy bibasilar atelectatic change. Lungs elsewhere clear. No change in cardiac silhouette.   Electronically Signed   By: Lowella Grip III M.D.   On: 03/06/2014 10:01    Assessment/Plan: Cholecystitis s/p perc chole drain 2/18 WBC trending down. Now extubated Drain functioning well. Call IR if drain issues.    LOS: 2 days    Ascencion Dike PA-C 03/08/2014 9:19 AM

## 2014-03-09 DIAGNOSIS — T829XXA Unspecified complication of cardiac and vascular prosthetic device, implant and graft, initial encounter: Secondary | ICD-10-CM

## 2014-03-09 LAB — CBC
HCT: 27.5 % — ABNORMAL LOW (ref 39.0–52.0)
Hemoglobin: 8.9 g/dL — ABNORMAL LOW (ref 13.0–17.0)
MCH: 27.6 pg (ref 26.0–34.0)
MCHC: 32.4 g/dL (ref 30.0–36.0)
MCV: 85.4 fL (ref 78.0–100.0)
Platelets: 428 10*3/uL — ABNORMAL HIGH (ref 150–400)
RBC: 3.22 MIL/uL — AB (ref 4.22–5.81)
RDW: 17 % — ABNORMAL HIGH (ref 11.5–15.5)
WBC: 25.6 10*3/uL — ABNORMAL HIGH (ref 4.0–10.5)

## 2014-03-09 LAB — RENAL FUNCTION PANEL
ALBUMIN: 2 g/dL — AB (ref 3.5–5.2)
Anion gap: 5 (ref 5–15)
BUN: 24 mg/dL — AB (ref 6–23)
CHLORIDE: 104 mmol/L (ref 96–112)
CO2: 27 mmol/L (ref 19–32)
CREATININE: 2.25 mg/dL — AB (ref 0.50–1.35)
Calcium: 8.1 mg/dL — ABNORMAL LOW (ref 8.4–10.5)
GFR calc Af Amer: 33 mL/min — ABNORMAL LOW (ref >90)
GFR calc non Af Amer: 28 mL/min — ABNORMAL LOW (ref >90)
Glucose, Bld: 109 mg/dL — ABNORMAL HIGH (ref 70–99)
Phosphorus: 2 mg/dL — ABNORMAL LOW (ref 2.3–4.6)
Potassium: 3.8 mmol/L (ref 3.5–5.1)
Sodium: 136 mmol/L (ref 135–145)

## 2014-03-09 LAB — GLUCOSE, CAPILLARY
GLUCOSE-CAPILLARY: 129 mg/dL — AB (ref 70–99)
GLUCOSE-CAPILLARY: 219 mg/dL — AB (ref 70–99)
GLUCOSE-CAPILLARY: 38 mg/dL — AB (ref 70–99)
Glucose-Capillary: 107 mg/dL — ABNORMAL HIGH (ref 70–99)
Glucose-Capillary: 110 mg/dL — ABNORMAL HIGH (ref 70–99)
Glucose-Capillary: 125 mg/dL — ABNORMAL HIGH (ref 70–99)
Glucose-Capillary: 154 mg/dL — ABNORMAL HIGH (ref 70–99)
Glucose-Capillary: 158 mg/dL — ABNORMAL HIGH (ref 70–99)
Glucose-Capillary: 34 mg/dL — CL (ref 70–99)

## 2014-03-09 LAB — TYPE AND SCREEN
ABO/RH(D): A POS
Antibody Screen: NEGATIVE
UNIT DIVISION: 0
Unit division: 0

## 2014-03-09 LAB — LIPASE, BLOOD: Lipase: 197 U/L — ABNORMAL HIGH (ref 11–59)

## 2014-03-09 LAB — PREALBUMIN: PREALBUMIN: 13.1 mg/dL — AB (ref 17.0–34.0)

## 2014-03-09 LAB — APTT: aPTT: 34 seconds (ref 24–37)

## 2014-03-09 LAB — MAGNESIUM: Magnesium: 2.3 mg/dL (ref 1.5–2.5)

## 2014-03-09 LAB — PATHOLOGIST SMEAR REVIEW

## 2014-03-09 MED ORDER — LABETALOL HCL 5 MG/ML IV SOLN: 20.0000 mg | INTRAVENOUS | Status: DC | PRN

## 2014-03-09 MED ORDER — FUROSEMIDE 10 MG/ML IJ SOLN
100.0000 mg | Freq: Once | INTRAVENOUS | Status: AC
  Administered 2014-03-09: 100 mg via INTRAVENOUS
  Filled 2014-03-09: qty 10

## 2014-03-09 MED ORDER — DEXTROSE 5 % IV SOLN
1.0000 g | INTRAVENOUS | Status: DC
  Administered 2014-03-10: 1 g via INTRAVENOUS
  Filled 2014-03-09: qty 1

## 2014-03-09 MED ORDER — LABETALOL HCL 5 MG/ML IV SOLN
10.0000 mg | INTRAVENOUS | Status: DC | PRN
  Filled 2014-03-09: qty 4

## 2014-03-09 MED ORDER — METRONIDAZOLE IN NACL 5-0.79 MG/ML-% IV SOLN
500.0000 mg | Freq: Three times a day (TID) | INTRAVENOUS | Status: DC
  Administered 2014-03-09 – 2014-03-10 (×4): 500 mg via INTRAVENOUS
  Filled 2014-03-09 (×6): qty 100

## 2014-03-09 NOTE — Progress Notes (Signed)
PULMONARY / CRITICAL CARE MEDICINE   Name: Gabriel Lynch MRN: [id number] DOB: 1945/08/21    ADMISSION DATE:  03/06/2014  REFERRING MD :  EDP  CHIEF COMPLAINT:  Brought in unresponsive  INITIAL PRESENTATION: 69 year old, nursing home resident, with ulcerative colitis/colostomy and wound VAC to sacral wound, BIBEMS unresponsive, with septic shock and AKI  STUDIES:  CT abdomen 2/19  SIGNIFICANT EVENTS: 2/19 ETT >>2/20  SUBJECTIVE:  Confused. No distress Per nurse, confusion minimally improved CRRT discontinued UOP of 421 ml yesterday Hgb of 8.9 today; WBC trending down  VITAL SIGNS: Temp:  [96.7 F (35.9 C)-98.7 F (37.1 C)] 98.2 F (36.8 C) (02/22 0600) Pulse Rate:  [58-94] 73 (02/22 0600) Resp:  [11-19] 16 (02/22 0600) BP: (130-177)/(57-104) 161/82 mmHg (02/22 0600) SpO2:  [95 %-100 %] 100 % (02/22 0600) Weight:  [141 lb 5 oz (64.1 kg)] 141 lb 5 oz (64.1 kg) (02/22 0600)  HEMODYNAMICS:   VENTILATOR SETTINGS:   INTAKE / OUTPUT:  Intake/Output Summary (Last 24 hours) at 03/09/14 0718 Last data filed at 03/09/14 0600  Gross per 24 hour  Intake   1500 ml  Output   1954 ml  Net   -454 ml    PHYSICAL EXAMINATION: General:  Chronically ill-appearing now extubated. Not in distress.  Neuro:  Alert, oriented, responds to commands, agitated  HEENT:  [place]/AT Cardiovascular:  S1-S2 regular rate and rhythm Lungs:  Clear to auscultation bilaterally; decreased bases  Abdomen:  Soft , colostomy with minimal dark green output Musculoskeletal:  Left shin with wound bandage, large sacral wound Skin:  Flaky scaling over both feet , left arm with slight oozing from skin damage s/p IV infiltration LABS:  CBC  Recent Labs Lab 03/07/14 0404 03/08/14 0410 03/09/14 0419  WBC 37.0* 24.3* 25.6*  HGB 7.6* 6.3* 8.9*  HCT 23.5* 19.3* 27.5*  PLT 602* 448* 428*   Coag's  Recent Labs Lab 03/06/14 0554 03/07/14 0404 03/08/14 0410 03/09/14 0419  APTT 42* >200* >200* 34   INR 1.54*  --   --   --    BMET  Recent Labs Lab 03/07/14 1620 03/08/14 0410 03/09/14 0419  NA 133* 136 136  K 4.0 4.1 3.8  CL 93* 102 104  CO2 25 27 27   BUN 41* 22 24*  CREATININE 2.75* 1.81* 2.25*  GLUCOSE 116* 107* 109*   Electrolytes  Recent Labs Lab 03/07/14 0404 03/07/14 1620 03/08/14 0410 03/09/14 0419  CALCIUM 8.0* 7.3* 7.4* 8.1*  MG 2.1  --  2.2 2.3  PHOS 4.4  4.6 3.5 2.2* 2.0*   Sepsis Markers  Recent Labs Lab 03/06/14 0800 03/06/14 1340 03/07/14 1000 03/08/14 0410  LATICACIDVEN  --  8.9* 9.3* 1.1  PROCALCITON 8.31  --   --   --    ABG  Recent Labs Lab 03/06/14 1157 03/06/14 1635 03/07/14 0310  PHART 7.243* 7.268* 7.416  PCO2ART 21.8* 26.2* 25.1*  PO2ART 68.0* 113.0* 122.0*   Liver Enzymes  Recent Labs Lab 03/06/14 0554  03/07/14 1620 03/08/14 0410 03/09/14 0419  AST 27  --   --   --   --   ALT 20  --   --   --   --   ALKPHOS 296*  --   --   --   --   BILITOT 0.3  --   --   --   --   ALBUMIN 2.6*  < > 2.0* 1.8* 2.0*  < > = values in this interval not displayed. Cardiac  Enzymes  Recent Labs Lab 03/06/14 0738 03/06/14 1339 03/06/14 2220  TROPONINI <0.03 0.05* 0.05*   Glucose  Recent Labs Lab 03/08/14 0706 03/08/14 1120 03/08/14 1519 03/08/14 1950 03/09/14 0045 03/09/14 0408  GLUCAP 110* 139* 161* 113* 129* 107*    Imaging No results found.   ASSESSMENT / PLAN:  PULMONARY OETT 2/19 >>extubated 2/20 A:Acute respiratory failure due to shock  Extubated. Persistent basilar atx.  P:   pulm hygiene  Aspiration precautions IS addition  CARDIOVASCULAR CVL2/19 >> A:  Septic shock -->off pressors  Right upper extremity swelling s/p IV infiltrate  P:  Keep euvolemic  Cont tele  See ID section  Limb elevation and heat  RENAL A:  AKI Hyperkalemia  Severe metabolic acidosis  P:   IHD possible per renal depending on renal function/UOP Cont strict I&O For int hd now  GASTROINTESTINAL A:   Ulcerative  colitis , colostomy, acute cholecystitis Mild elevation of lipase P:   Dysphagia 2 diet Drain placed to remain    HEMATOLOGIC A:  Leukemoid from sepsis Anemia of chronic illness. Hgb drifted down to < 7. No clear bleeding. May be some loss from the HD vs dilution effect. S/p 1u PRBC Hemoconcentration 2/22  P:  Follow CBC dvt prevention  Sub q hep  INFECTIOUS A:  Septic shock -source is possibly UTI , wounds, acute cholecystis Sacral wound P:   BCx2 2/19 >>> no growth Bile fluid 2/19>>> no growth Abx:  2/19 Cefepime>>> vancomycin, start date 2/19 >>2/22 Flagyl 2/20>>>2/22  Drain in place Cont wet to dry dressing change to sacrum Surgery considering debridement later this week.  Keep cefepime with urine pending, send culture Dc vanc, flagyl   ENDOCRINE A:  Risk for hypoglycemia   P:   SSI order set  NEUROLOGIC A:  Acute encephalopathy -related to shock and metabolic  P:   RASS goal: 0 intermittent fentanyl   FAMILY  - Updates: 2/20 sister updated  - Inter-disciplinary family meet or Palliative Care meeting due by:  2/26   TODAY'S SUMMARY: Septic shock, AKI with hyperkalemia- source could be UTI, abdomen or wounds. Clinically has tolerated extubation. No longer in shock. No positive culture data yet.   Cordelia Poche, MD PGY-2, [facility] 03/09/2014, 7:18 AM   STAFF NOTE: I, Merrie Roof, MD FACP have personally reviewed patient's available data, including medical history, events of note, physical examination and test results as part of my evaluation. I have discussed with resident/NP and other care providers such as pharmacist, RN and RRT. In addition, I personally evaluated patient and elicited key findings of: will narrow to cefepime, but remains as from NH and UTI, need culture urine, dc vanc, flagyl, tolerating diet well, pt consulted, to triad, floor, off CVVHD   Lavon Paganini. Titus Mould, MD, [place] Pgr: [facility] 03/09/2014 10:58 AM

## 2014-03-09 NOTE — Progress Notes (Signed)
eLink Physician-Brief Progress Note Patient Name: Gabriel Lynch DOB: 1945-11-21 MRN: [id number]   Date of Service  03/09/2014  HPI/Events of Note  Call from nurse reporting hypertension with BP of 177/92 (114) and HR of 81.  On BB at home.  eICU Interventions  Plan: Labetalol 10 mg IV [id number] prn systolic BP greater than 659 mmHg     Intervention Category Intermediate Interventions: Hypertension - evaluation and management  Javares Kaufhold 03/09/2014, 5:31 AM

## 2014-03-09 NOTE — Progress Notes (Signed)
UR Completed.  [phone]  

## 2014-03-09 NOTE — Clinical Social Work Placement (Signed)
Clinical Social Work Department CLINICAL SOCIAL WORK PLACEMENT NOTE 03/09/2014  Patient:  Gabriel Lynch, Gabriel Lynch  Account Number:  [id number] Admit date:  03/06/2014  Clinical Social Worker:  Glendon Axe, CLINICAL SOCIAL WORKER  Date/time:  03/09/2014 04:22 PM  Clinical Social Work is seeking post-discharge placement for this patient at the following level of care:   SKILLED NURSING   (*CSW will update this form in Epic as items are completed)   03/09/2014  Patient/family provided with [place] Department of Clinical Social Work's list of facilities offering this level of care within the geographic area requested by the patient (or if unable, by the patient's family).  03/09/2014  Patient/family informed of their freedom to choose among providers that offer the needed level of care, that participate in Medicare, Medicaid or managed care program needed by the patient, have an available bed and are willing to accept the patient.  03/09/2014  Patient/family informed of [facility]' ownership interest in [facility], as well as of the fact that they are under no obligation to receive care at this facility.  PASARR submitted to EDS on EXISTING PASARR number received on EXISTING  FL2 transmitted to all facilities in geographic area requested by pt/family on  03/09/2014 FL2 transmitted to all facilities within larger geographic area on   Patient informed that his/her managed care company has contracts with or will negotiate with  certain facilities, including the following:     Patient/family informed of bed offers received:   Patient chooses bed at  Physician recommends and patient chooses bed at    Patient to be transferred to  on   Patient to be transferred to facility by  Patient and family notified of transfer on  Name of family member notified:    The following physician request were entered in Epic:   Additional Comments:  Glendon Axe, MSW, LCSWA [phone] 03/09/2014 4:23 PM

## 2014-03-09 NOTE — Progress Notes (Signed)
ANTIBIOTIC CONSULT NOTE - FOLLOW UP  Pharmacy Consult for Cefepime Indication: rule out sepsis  Allergies  Allergen Reactions  . Influenza Vaccines Other (See Comments)    ON MAR    Patient Measurements: Height: 5\' 10"  (177.8 cm) Weight: 141 lb 5 oz (64.1 kg) IBW/kg (Calculated) : 73 Vital Signs: Temp: 98.2 F (36.8 C) (02/22 0600) Temp Source: Core (Comment) (02/22 0600) BP: 161/82 mmHg (02/22 0600) Pulse Rate: 73 (02/22 0600) Intake/Output from previous day: 02/21 0701 - 02/22 0700 In: 1510 [P.O.:240; I.V.:190; Blood:530; IV SFKCLEXNT:700] Out: 1954 [Urine:421; Drains:310; Stool:100] Intake/Output from this shift: Total I/O In: 64 [I.V.:10; IV Piggyback:50] Out: 250 [Urine:250]  Labs:  Recent Labs  03/07/14 0404 03/07/14 1620 03/08/14 0410 03/09/14 0419  WBC 37.0*  --  24.3* 25.6*  HGB 7.6*  --  6.3* 8.9*  PLT 602*  --  448* 428*  CREATININE 3.66* 2.75* 1.81* 2.25*   Estimated Creatinine Clearance: 28.9 mL/min (by C-G formula based on Cr of 2.25). No results for input(s): VANCOTROUGH, VANCOPEAK, VANCORANDOM, GENTTROUGH, GENTPEAK, GENTRANDOM, TOBRATROUGH, TOBRAPEAK, TOBRARND, AMIKACINPEAK, AMIKACINTROU, AMIKACIN in the last 72 hours.   Microbiology: Recent Results (from the past 720 hour(s))  Culture, blood (routine x 2)     Status: None (Preliminary result)   Collection Time: 03/06/14  6:28 AM  Result Value Ref Range Status   Specimen Description BLOOD NECK LEFT  Final   Special Requests BOTTLES DRAWN AEROBIC AND ANAEROBIC 10CC  Final   Culture   Final           BLOOD CULTURE RECEIVED NO GROWTH TO DATE CULTURE WILL BE HELD FOR 5 DAYS BEFORE ISSUING A FINAL NEGATIVE REPORT Performed at [facility]    Report Status PENDING  Incomplete  Culture, blood (routine x 2)     Status: None (Preliminary result)   Collection Time: 03/06/14  6:35 AM  Result Value Ref Range Status   Specimen Description BLOOD NECK RIGHT  Final   Special Requests BOTTLES DRAWN  AEROBIC ONLY 8CC  Final   Culture   Final           BLOOD CULTURE RECEIVED NO GROWTH TO DATE CULTURE WILL BE HELD FOR 5 DAYS BEFORE ISSUING A FINAL NEGATIVE REPORT Performed at [facility]    Report Status PENDING  Incomplete  MRSA PCR Screening     Status: Abnormal   Collection Time: 03/06/14  9:41 AM  Result Value Ref Range Status   MRSA by PCR POSITIVE (A) NEGATIVE Final    Comment:        The GeneXpert MRSA Assay (FDA approved for NASAL specimens only), is one component of a comprehensive MRSA colonization surveillance program. It is not intended to diagnose MRSA infection nor to guide or monitor treatment for MRSA infections. RESULT CALLED TO, READ BACK BY AND VERIFIED WITH: S. HARDY RN 11:40 03/06/14 ([email or web address])   Body fluid culture     Status: None (Preliminary result)   Collection Time: 03/06/14  2:37 PM  Result Value Ref Range Status   Specimen Description BILE  Final   Special Requests NONE  Final   Gram Stain   Final    NO WBC SEEN NO ORGANISMS SEEN Performed at [facility]    Culture   Final    NO GROWTH 1 DAY Performed at [facility]    Report Status PENDING  Incomplete  Anaerobic culture     Status: None (Preliminary result)   Collection Time: 03/06/14  2:37 PM  Result Value Ref Range Status   Specimen Description BILE  Final   Special Requests NONE  Final   Gram Stain   Final    NO WBC SEEN NO ORGANISMS SEEN Performed at [facility]    Culture   Final    NO ANAEROBES ISOLATED; CULTURE IN PROGRESS FOR 5 DAYS Performed at [facility]    Report Status PENDING  Incomplete    Anti-infectives    Start     Dose/Rate Route Frequency Ordered Stop   03/10/14 0856  ceFEPIme (MAXIPIME) 1 g in dextrose 5 % 50 mL IVPB     1 g 100 mL/hr over 30 Minutes Intravenous Every 24 hours 03/09/14 1106     03/07/14 1200  metroNIDAZOLE (FLAGYL) IVPB 500 mg  Status:  Discontinued     500 mg 100 mL/hr over 60 Minutes  Intravenous Every 8 hours 03/07/14 0940 03/09/14 1103   03/07/14 0600  vancomycin (VANCOCIN) IVPB 750 mg/150 ml premix  Status:  Discontinued     750 mg 150 mL/hr over 60 Minutes Intravenous Every 24 hours 03/06/14 1311 03/09/14 1103   03/06/14 2200  ceFEPIme (MAXIPIME) 2 g in dextrose 5 % 50 mL IVPB  Status:  Discontinued     2 g 100 mL/hr over 30 Minutes Intravenous Every 12 hours 03/06/14 1311 03/09/14 1106   03/06/14 1230  piperacillin-tazobactam (ZOSYN) IVPB 3.375 g     3.375 g 100 mL/hr over 30 Minutes Intravenous  Once 03/06/14 1200 03/06/14 1316   03/06/14 1200  piperacillin-tazobactam (ZOSYN) IVPB 3.375 g  Status:  Discontinued     3.375 g 12.5 mL/hr over 240 Minutes Intravenous  Once 03/06/14 1156 03/06/14 1159   03/06/14 0630  ceFEPIme (MAXIPIME) 1 g in dextrose 5 % 50 mL IVPB     1 g 100 mL/hr over 30 Minutes Intravenous  Once 03/06/14 0619 03/06/14 0703   03/06/14 0630  vancomycin (VANCOCIN) 1,250 mg in sodium chloride 0.9 % 250 mL IVPB     1,250 mg 166.7 mL/hr over 90 Minutes Intravenous  Once 03/06/14 3016 03/06/14 0810      Assessment: 69 year old male on day#4 of cefepime and flagyl empiric for sepsis- 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 Vancomycin stopped today. WBC is 25.6 (slight increase but likely hemo-concentrated). LA down. UA dirty. No urine culture obtained. MRSA pcr (+). Blood and bile ngtd.   Goal of Therapy:  Clinical resolution of infection  Plan:  Decrease Cefepime 1g IV every 24 hours. Continue Flagyl 500mg  IV every 8 hours for anaerobic coverage with sacral wound (Verbal discussion with Dr. Titus Mould).   Sloan Leiter, PharmD, BCPS Clinical Pharmacist [phone] 03/09/2014,11:07 AM

## 2014-03-09 NOTE — Evaluation (Signed)
Physical Therapy Evaluation Patient Details Name: Gabriel Lynch MRN: [id number] DOB: 17-Sep-1945 Today's Date: 03/09/2014   History of Present Illness  69 year old, nursing home resident x 1 month with ulcerative colitis/colostomy and sacral wound, brought in 2/19 unresponsive, with septic shock and AKI. Prosthetic right eye  Clinical Impression  Pt pleasant with decreased cognition, strength and function who has been at SNF x 1 month after prior hospitalization in [place] with colitis and sacral wound. Prior to this pt was living alone. Pt with delayed processing, impaired problem solving and reports of hallucination. Sisters present to provide PLOF as well as baseline cognition being [facility] and conversant. Family reports acute change over the last week PTA. Pt will benefit from acute therapy to maximize mobility, function, strength, balance and activity to decrease burden of care.     Follow Up Recommendations SNF    Equipment Recommendations  Rolling walker with 5" wheels    Recommendations for Other Services OT consult;Speech consult     Precautions / Restrictions Precautions Precautions: Fall Precaution Comments: perc chole drain, sacral wound     Mobility  Bed Mobility Overal bed mobility: Needs Assistance Bed Mobility: Sit to Supine       Sit to supine: Min assist   General bed mobility comments: max cues for sequence with assist to bring legs onto surface  Transfers Overall transfer level: Needs assistance   Transfers: Sit to/from Stand Sit to Stand: Mod assist;+2 safety/equipment         General transfer comment: mod +2 from chair for initial standing. Pt Rotating pelvis and increased time to achieve standing with knee and hip extension, once in standing min assist with +1 for safety and chair due to lines and cognition  Ambulation/Gait Ambulation/Gait assistance: +2 safety/equipment;Min assist Ambulation Distance (Feet): 22 Feet Assistive device: Rolling  walker (2 wheeled) Gait Pattern/deviations: Shuffle;Narrow base of support;Drifts right/left   Gait velocity interpretation: Below normal speed for age/gender General Gait Details: max cues for sequence, distance and safety with min assist to direct RW and for posture and balance  Stairs            Wheelchair Mobility    Modified Rankin (Stroke Patients Only)       Balance Overall balance assessment: Needs assistance   Sitting balance-Leahy Scale: Fair       Standing balance-Leahy Scale: Poor                               Pertinent Vitals/Pain Pain Assessment: 0-10 Pain Score: 5  Pain Location: sacrum, dressing change in standing my MD Pain Descriptors / Indicators: Aching Pain Intervention(s): Repositioned;Limited activity within patient's tolerance  HR 75-143 during activity with immediate return to 80 once sitting after gait, RN present throughout mobility     Home Living Family/patient expects to be discharged to:: Skilled nursing facility                      Prior Function Level of Independence: Needs assistance   Gait / Transfers Assistance Needed: min assist for all bathing dressing and transfers and walking in hall with RW and PT     Comments: pt has been at [facility] x 1 month after admission in [place] with colitis and receving assist there. Prior to that admission on 12/25 he was living alone independently with desire to return home     Hand Dominance  Extremity/Trunk Assessment   Upper Extremity Assessment: Generalized weakness           Lower Extremity Assessment: Generalized weakness      Cervical / Trunk Assessment: Normal;Other exceptions  Communication   Communication: No difficulties  Cognition Arousal/Alertness: Awake/alert Behavior During Therapy: Impulsive Overall Cognitive Status: Impaired/Different from baseline Area of Impairment: Orientation;Attention;Memory;Following  commands;Safety/judgement;Awareness;Problem solving Orientation Level: Disoriented to;Place;Person;Situation;Time Current Attention Level: Sustained Memory: Decreased short-term memory;Decreased recall of precautions Following Commands: Follows one step commands inconsistently;Follows one step commands with increased time Safety/Judgement: Decreased awareness of deficits;Decreased awareness of safety   Problem Solving: Slow processing;Decreased initiation;Difficulty sequencing;Requires verbal cues;Requires tactile cues General Comments: Pt at baseline about to converse appropriately. Pt hallucinating that kids slept in his room last night, slow processing with very delayed response to commands and questions    General Comments      Exercises General Exercises - Lower Extremity Long Arc Quad: AROM;Seated;Both;5 reps      Assessment/Plan    PT Assessment Patient needs continued PT services  PT Diagnosis Difficulty walking;Acute pain;Generalized weakness;Altered mental status   PT Problem List Decreased strength;Decreased cognition;Decreased activity tolerance;Decreased skin integrity;Decreased balance;Pain;Decreased mobility;Cardiopulmonary status limiting activity  PT Treatment Interventions Gait training;DME instruction;Functional mobility training;Therapeutic activities;Therapeutic exercise;Balance training;Patient/family education;Cognitive remediation   PT Goals (Current goals can be found in the Care Plan section) Acute Rehab PT Goals Patient Stated Goal: go home PT Goal Formulation: With patient/family Time For Goal Achievement: 03/23/14 Potential to Achieve Goals: Fair    Frequency Min 3X/week   Barriers to discharge Decreased caregiver support      Co-evaluation               End of Session Equipment Utilized During Treatment: Gait belt Activity Tolerance: Patient tolerated treatment well Patient left: in bed;with call bell/phone within reach;with  nursing/sitter in room;with family/visitor present Nurse Communication: Mobility status;Precautions         Time: 9191-6606 PT Time Calculation (min) (ACUTE ONLY): 33 min   Charges:   PT Evaluation $Initial PT Evaluation Tier I: 1 Procedure PT Treatments $Gait Training: 8-22 mins   PT G CodesMelford Aase 03/09/2014, 10:44 AM Elwyn Reach, [place]

## 2014-03-09 NOTE — Progress Notes (Signed)
Speech Language Pathology Treatment: Dysphagia  Patient Details Name: Gabriel Lynch MRN: [id number] DOB: 01/23/1945 Today's Date: 03/09/2014 Time: 5885-0277 SLP Time Calculation (min) (ACUTE ONLY): 10 min  Assessment / Plan / Recommendation Clinical Impression  Patient making steady progress with functional goals, able to self feed diagnostic po trials with successful oral acceptance of bolus. Mastication remains delayed with decreased bolus cohesion with dysphagia 3 textures, requiring max clinician cueing for liquid wash to assist in clearing, making dysphagia 2 appropriate solid textures. No overt s/s of aspiration noted across consistencies. Will continue on current diet with f/u for diet advancement as mentation continues to improve.    HPI HPI: 69 year old, nursing home resident, with ulcerative colitis/colostomy and wound VAC to sacral wound, BIBEMS unresponsive, with septic shock and AKI. Pt intubated 2/19-2/20.   Pertinent Vitals Pain Assessment: No/denies pain  SLP Plan  Continue with current plan of care    Recommendations Diet recommendations: Dysphagia 2 (fine chop);Thin liquid Medication Administration: Whole meds with liquid Supervision: Patient able to self feed;Full supervision/cueing for compensatory strategies Compensations: Slow rate;Small sips/bites;Check for pocketing Postural Changes and/or Swallow Maneuvers: Seated upright 90 degrees              Oral Care Recommendations: Oral care BID Follow up Recommendations: None Plan: Continue with current plan of care    [place], [place] [phone]   Gabriel Lynch 03/09/2014, 3:12 PM

## 2014-03-09 NOTE — Progress Notes (Signed)
Assessment/ Plan: Pt is a 69 y.o. yo male NH resident with ulcerative colitis/colostomy and wound vac to deep sacral decub who was admitted on 03/06/2014 with Septic shock- cholecystitis and AKI  Assessment/Plan: 1. Renal- AKI- oliguric- due to septic shock/ATN- requiring CRRT from 2/19 -22.  Will give a trial of furosemide   2. Sepsis- appears to be due to cholecystitis- s/p chole tube and maxipime/zosyn and vanc- markedly improved 3. Anemia- s/p transfusion-  4. HTN/volume- seems euvolemic to me- not sure what the etiology is of his swollen left arm.   Subjective: Interval History:   Objective: Vital signs in last 24 hours: Temp:  [96.8 F (36 C)-98.7 F (37.1 C)] 98.2 F (36.8 C) (02/22 0600) Pulse Rate:  [58-94] 73 (02/22 0600) Resp:  [11-19] 16 (02/22 0600) BP: (130-177)/(57-98) 161/82 mmHg (02/22 0600) SpO2:  [100 %] 100 % (02/22 0600) Weight:  [64.1 kg (141 lb 5 oz)] 64.1 kg (141 lb 5 oz) (02/22 0600) Weight change: -2.3 kg (-5 lb 1.1 oz)  Intake/Output from previous day: 02/21 0701 - 02/22 0700 In: 1510 [P.O.:240; I.V.:190; Blood:530; IV Piggyback:550] Out: 1954 [Urine:421; Drains:310; Stool:100] Intake/Output this shift: Total I/O In: 60 [I.V.:10; IV Piggyback:50] Out: 250 [Urine:250]  GI: soft. Colostomy bag right abd  Facial fullness c/w admission, alert Remarkable improvement Sitting up in chair  Lab Results:  Recent Labs  03/08/14 0410 03/09/14 0419  WBC 24.3* 25.6*  HGB 6.3* 8.9*  HCT 19.3* 27.5*  PLT 448* 428*   BMET:  Recent Labs  03/08/14 0410 03/09/14 0419  NA 136 136  K 4.1 3.8  CL 102 104  CO2 27 27  GLUCOSE 107* 109*  BUN 22 24*  CREATININE 1.81* 2.25*  CALCIUM 7.4* 8.1*   No results for input(s): PTH in the last 72 hours. Iron Studies: No results for input(s): IRON, TIBC, TRANSFERRIN, FERRITIN in the last 72 hours. Studies/Results: No results found.  Scheduled: . sodium chloride   Intravenous Once  . antiseptic oral rinse   7 mL Mouth Rinse BID  . ceFEPime (MAXIPIME) IV  2 g Intravenous Q12H  . Chlorhexidine Gluconate Cloth  6 each Topical Q0600  . insulin aspart  0-15 Units Subcutaneous 6 times per day  . levothyroxine  100 mcg Oral QAC breakfast  . metronidazole  500 mg Intravenous Q8H  . mupirocin ointment  1 application Nasal BID  . pantoprazole  40 mg Oral Daily  . sodium chloride  1,000 mL Intravenous Once  . sodium chloride  500 mL Intravenous Once  . vancomycin  750 mg Intravenous Q24H     LOS: 3 days   Gabriel Lynch C 03/09/2014,9:53 AM

## 2014-03-09 NOTE — Progress Notes (Signed)
SLP Cancellation Note  Patient Details Name: Emett Stapel MRN: [id number] DOB: [birth year]   Cancelled treatment:       Reason Eval/Treat Not Completed: Fatigue/lethargy limiting ability to participate  Gabriel [place], [place] [phone]  [id number] [id number] 03/09/2014, 10:26 AM

## 2014-03-09 NOTE — Progress Notes (Signed)
Patient ID: Gabriel Lynch, male   DOB: 02-17-1945, 69 y.o.   MRN: [id number]     [address] [place], [place] [address]    Phone: [phone] FAX: [phone]     Subjective: WBC overall down, remains at 25.6k.  Oliguria.  Worsening sCr.    Objective:  Vital signs:  Filed Vitals:   03/09/14 0300 03/09/14 0400 03/09/14 0500 03/09/14 0600  BP: 153/92 165/85 177/92 161/82  Pulse: 75 89 78 73  Temp: 98.7 F (37.1 C) 98.6 F (37 C) 98 F (36.7 C) 98.2 F (36.8 C)  TempSrc:    Core (Comment)  Resp: _0 Height:      Weight:    141 lb 5 oz (64.1 kg)  SpO2: 100% 100% 100% 100%    Last BM Date: 03/06/14  Intake/Output   Yesterday:  02/21 0701 - 02/22 0700 In: 1500 [P.O.:240; I.V.:180; Blood:530; IV Piggyback:550] Out: 1954 [Urine:421; Drains:310; Stool:100] This shift:    I/O last 3 completed shifts: In: 2818.7 [P.O.:240; I.V.:948.7; Blood:680; IV Piggyback:950] Out: 1017 [Urine:511; Drains:450; PZWCH:8527; Stool:200]     Physical Exam: General: Pt awake/alert/oriented x4 in no acute distress Abdomen: Soft.  Nondistended.  Non tender.  Ostomy.  Perc chole drain in place with bilious output.  No evidence of peritonitis.  No incarcerated hernias. Skin:       Problem List:   Active Problems:   Septic shock   Acute cholecystitis   Stage IV pressure ulcer of sacral region   Acidosis   AKI (acute kidney injury)    Results:   Labs: Results for orders placed or performed during the hospital encounter of 03/06/14 (from the past 48 hour(s))  Lactic acid, plasma     Status: Abnormal   Collection Time: 03/07/14 10:00 AM  Result Value Ref Range   Lactic Acid, Venous 9.3 (HH) 0.5 - 2.0 mmol/L    Comment: REPEATED TO VERIFY CRITICAL RESULT CALLED TO, READ BACK BY AND VERIFIED WITH: CRITESTRN [id number] MCCAULEG   Lipase, blood     Status: Abnormal   Collection Time: 03/07/14 10:00 AM    Result Value Ref Range   Lipase 193 (H) 11 - 59 U/L  Glucose, capillary     Status: Abnormal   Collection Time: 03/07/14 11:15 AM  Result Value Ref Range   Glucose-Capillary 120 (H) 70 - 99 mg/dL  Glucose, capillary     Status: Abnormal   Collection Time: 03/07/14  3:38 PM  Result Value Ref Range   Glucose-Capillary 118 (H) 70 - 99 mg/dL  POCT Activated clotting time     Status: None   Collection Time: 03/07/14  4:13 PM  Result Value Ref Range   Activated Clotting Time 165 seconds  Renal function panel (daily at 1600)     Status: Abnormal   Collection Time: 03/07/14  4:20 PM  Result Value Ref Range   Sodium 133 (L) 135 - 145 mmol/L   Potassium 4.0 3.5 - 5.1 mmol/L   Chloride 93 (L) 96 - 112 mmol/L   CO2 25 19 - 32 mmol/L   Glucose, Bld 116 (H) 70 - 99 mg/dL   BUN 41 (H) 6 - 23 mg/dL   Creatinine, Ser 2.75 (H) 0.50 - 1.35 mg/dL   Calcium 7.3 (L) 8.4 - 10.5 mg/dL   Phosphorus 3.5 2.3 - 4.6 mg/dL   Albumin 2.0 (L) 3.5 - 5.2 g/dL  GFR calc non Af Amer 22 (L) >90 mL/min   GFR calc Af Amer 26 (L) >90 mL/min    Comment: (NOTE) The eGFR has been calculated using the CKD EPI equation. This calculation has not been validated in all clinical situations. eGFR's persistently <90 mL/min signify possible Chronic Kidney Disease.    Anion gap 15 5 - 15  POCT Activated clotting time     Status: None   Collection Time: 03/07/14  7:10 PM  Result Value Ref Range   Activated Clotting Time 171 seconds  Glucose, capillary     Status: Abnormal   Collection Time: 03/07/14  8:01 PM  Result Value Ref Range   Glucose-Capillary 118 (H) 70 - 99 mg/dL   Comment 1 Notify RN   POCT Activated clotting time     Status: None   Collection Time: 03/07/14  8:10 PM  Result Value Ref Range   Activated Clotting Time 165 seconds  POCT Activated clotting time     Status: None   Collection Time: 03/07/14  9:07 PM  Result Value Ref Range   Activated Clotting Time 165 seconds  POCT Activated clotting time      Status: None   Collection Time: 03/07/14 10:01 PM  Result Value Ref Range   Activated Clotting Time 171 seconds  POCT Activated clotting time     Status: None   Collection Time: 03/07/14 10:57 PM  Result Value Ref Range   Activated Clotting Time 190 seconds  POCT Activated clotting time     Status: None   Collection Time: 03/08/14 12:07 AM  Result Value Ref Range   Activated Clotting Time 214 seconds  Glucose, capillary     Status: Abnormal   Collection Time: 03/08/14 12:35 AM  Result Value Ref Range   Glucose-Capillary 140 (H) 70 - 99 mg/dL  POCT Activated clotting time     Status: None   Collection Time: 03/08/14  1:05 AM  Result Value Ref Range   Activated Clotting Time 183 seconds  POCT Activated clotting time     Status: None   Collection Time: 03/08/14  2:11 AM  Result Value Ref Range   Activated Clotting Time 196 seconds  POCT Activated clotting time     Status: None   Collection Time: 03/08/14  3:05 AM  Result Value Ref Range   Activated Clotting Time 190 seconds  POCT Activated clotting time     Status: None   Collection Time: 03/08/14  3:59 AM  Result Value Ref Range   Activated Clotting Time 190 seconds  Renal function panel (daily at 0500)     Status: Abnormal   Collection Time: 03/08/14  4:10 AM  Result Value Ref Range   Sodium 136 135 - 145 mmol/L   Potassium 4.1 3.5 - 5.1 mmol/L   Chloride 102 96 - 112 mmol/L   CO2 27 19 - 32 mmol/L   Glucose, Bld 107 (H) 70 - 99 mg/dL   BUN 22 6 - 23 mg/dL    Comment: DELTA CHECK NOTED   Creatinine, Ser 1.81 (H) 0.50 - 1.35 mg/dL    Comment: DELTA CHECK NOTED   Calcium 7.4 (L) 8.4 - 10.5 mg/dL   Phosphorus 2.2 (L) 2.3 - 4.6 mg/dL   Albumin 1.8 (L) 3.5 - 5.2 g/dL   GFR calc non Af Amer 37 (L) >90 mL/min   GFR calc Af Amer 43 (L) >90 mL/min    Comment: (NOTE) The eGFR has been calculated using the CKD EPI  equation. This calculation has not been validated in all clinical situations. eGFR's persistently <90 mL/min  signify possible Chronic Kidney Disease.    Anion gap 7 5 - 15  Magnesium     Status: None   Collection Time: 03/08/14  4:10 AM  Result Value Ref Range   Magnesium 2.2 1.5 - 2.5 mg/dL  APTT     Status: Abnormal   Collection Time: 03/08/14  4:10 AM  Result Value Ref Range   aPTT >200 (HH) 24 - 37 seconds    Comment:        IF BASELINE aPTT IS ELEVATED, SUGGEST PATIENT RISK ASSESSMENT BE USED TO DETERMINE APPROPRIATE ANTICOAGULANT THERAPY. REPEATED TO VERIFY CRITICAL RESULT CALLED TO, READ BACK BY AND VERIFIED WITH: C. HAYES RN [phone] 0534 GREEN R   CBC     Status: Abnormal   Collection Time: 03/08/14  4:10 AM  Result Value Ref Range   WBC 24.3 (H) 4.0 - 10.5 K/uL   RBC 2.28 (L) 4.22 - 5.81 MIL/uL   Hemoglobin 6.3 (LL) 13.0 - 17.0 g/dL    Comment: REPEATED TO VERIFY CRITICAL RESULT CALLED TO, READ BACK BY AND VERIFIED WITH: C. HAYES RN [phone] 0513 GREEN R    HCT 19.3 (L) 39.0 - 52.0 %   MCV 84.6 78.0 - 100.0 fL   MCH 27.6 26.0 - 34.0 pg   MCHC 32.6 30.0 - 36.0 g/dL   RDW 16.8 (H) 11.5 - 15.5 %   Platelets 448 (H) 150 - 400 K/uL    Comment: REPEATED TO VERIFY  Lactic acid, plasma     Status: None   Collection Time: 03/08/14  4:10 AM  Result Value Ref Range   Lactic Acid, Venous 1.1 0.5 - 2.0 mmol/L  Glucose, capillary     Status: None   Collection Time: 03/08/14  4:16 AM  Result Value Ref Range   Glucose-Capillary 99 70 - 99 mg/dL  Prepare RBC     Status: None   Collection Time: 03/08/14  6:00 AM  Result Value Ref Range   Order Confirmation ORDER PROCESSED BY BLOOD BANK   Glucose, capillary     Status: Abnormal   Collection Time: 03/08/14  7:06 AM  Result Value Ref Range   Glucose-Capillary 110 (H) 70 - 99 mg/dL  Glucose, capillary     Status: Abnormal   Collection Time: 03/08/14 11:20 AM  Result Value Ref Range   Glucose-Capillary 139 (H) 70 - 99 mg/dL  Glucose, capillary     Status: Abnormal   Collection Time: 03/08/14  3:19 PM  Result Value Ref Range    Glucose-Capillary 161 (H) 70 - 99 mg/dL  Glucose, capillary     Status: Abnormal   Collection Time: 03/08/14  7:50 PM  Result Value Ref Range   Glucose-Capillary 113 (H) 70 - 99 mg/dL  Glucose, capillary     Status: Abnormal   Collection Time: 03/09/14 12:45 AM  Result Value Ref Range   Glucose-Capillary 129 (H) 70 - 99 mg/dL   Comment 1 Notify RN   Glucose, capillary     Status: Abnormal   Collection Time: 03/09/14  4:08 AM  Result Value Ref Range   Glucose-Capillary 107 (H) 70 - 99 mg/dL  Renal function panel (daily at 0500)     Status: Abnormal   Collection Time: 03/09/14  4:19 AM  Result Value Ref Range   Sodium 136 135 - 145 mmol/L   Potassium 3.8 3.5 - 5.1 mmol/L   Chloride  104 96 - 112 mmol/L   CO2 27 19 - 32 mmol/L   Glucose, Bld 109 (H) 70 - 99 mg/dL   BUN 24 (H) 6 - 23 mg/dL   Creatinine, Ser 2.25 (H) 0.50 - 1.35 mg/dL   Calcium 8.1 (L) 8.4 - 10.5 mg/dL   Phosphorus 2.0 (L) 2.3 - 4.6 mg/dL   Albumin 2.0 (L) 3.5 - 5.2 g/dL   GFR calc non Af Amer 28 (L) >90 mL/min   GFR calc Af Amer 33 (L) >90 mL/min    Comment: (NOTE) The eGFR has been calculated using the CKD EPI equation. This calculation has not been validated in all clinical situations. eGFR's persistently <90 mL/min signify possible Chronic Kidney Disease.    Anion gap 5 5 - 15  Magnesium     Status: None   Collection Time: 03/09/14  4:19 AM  Result Value Ref Range   Magnesium 2.3 1.5 - 2.5 mg/dL  APTT     Status: None   Collection Time: 03/09/14  4:19 AM  Result Value Ref Range   aPTT 34 24 - 37 seconds  CBC     Status: Abnormal   Collection Time: 03/09/14  4:19 AM  Result Value Ref Range   WBC 25.6 (H) 4.0 - 10.5 K/uL   RBC 3.22 (L) 4.22 - 5.81 MIL/uL   Hemoglobin 8.9 (L) 13.0 - 17.0 g/dL    Comment: REPEATED TO VERIFY   HCT 27.5 (L) 39.0 - 52.0 %   MCV 85.4 78.0 - 100.0 fL   MCH 27.6 26.0 - 34.0 pg   MCHC 32.4 30.0 - 36.0 g/dL   RDW 17.0 (H) 11.5 - 15.5 %   Platelets 428 (H) 150 - 400 K/uL     Imaging / Studies: No results found.  Medications / Allergies:  Scheduled Meds: . sodium chloride   Intravenous Once  . antiseptic oral rinse  7 mL Mouth Rinse BID  . ceFEPime (MAXIPIME) IV  2 g Intravenous Q12H  . Chlorhexidine Gluconate Cloth  6 each Topical Q0600  . insulin aspart  0-15 Units Subcutaneous 6 times per day  . levothyroxine  100 mcg Oral QAC breakfast  . metronidazole  500 mg Intravenous Q8H  . mupirocin ointment  1 application Nasal BID  . pantoprazole  40 mg Oral Daily  . sodium chloride  1,000 mL Intravenous Once  . sodium chloride  500 mL Intravenous Once  . vancomycin  750 mg Intravenous Q24H   Continuous Infusions: . sodium chloride    . sodium chloride    . fentaNYL infusion INTRAVENOUS 150 mcg/hr (03/07/14 0537)  . norepinephrine (LEVOPHED) Adult infusion Stopped (03/07/14 0100)  . vasopressin (PITRESSIN) infusion - *FOR SHOCK* Stopped (03/06/14 2340)   PRN Meds:.sodium chloride, calcium chloride, etomidate, fentaNYL, labetalol, rocuronium  Antibiotics: Anti-infectives    Start     Dose/Rate Route Frequency Ordered Stop   03/07/14 1200  metroNIDAZOLE (FLAGYL) IVPB 500 mg     500 mg 100 mL/hr over 60 Minutes Intravenous Every 8 hours 03/07/14 0940     03/07/14 0600  vancomycin (VANCOCIN) IVPB 750 mg/150 ml premix     750 mg 150 mL/hr over 60 Minutes Intravenous Every 24 hours 03/06/14 1311     03/06/14 2200  ceFEPIme (MAXIPIME) 2 g in dextrose 5 % 50 mL IVPB     2 g 100 mL/hr over 30 Minutes Intravenous Every 12 hours 03/06/14 1311     03/06/14 1230  piperacillin-tazobactam (ZOSYN) IVPB 3.375 g  3.375 g 100 mL/hr over 30 Minutes Intravenous  Once 03/06/14 1200 03/06/14 1316   03/06/14 1200  piperacillin-tazobactam (ZOSYN) IVPB 3.375 g  Status:  Discontinued     3.375 g 12.5 mL/hr over 240 Minutes Intravenous  Once 03/06/14 1156 03/06/14 1159   03/06/14 0630  ceFEPIme (MAXIPIME) 1 g in dextrose 5 % 50 mL IVPB     1 g 100 mL/hr over 30  Minutes Intravenous  Once 03/06/14 0619 03/06/14 0703   03/06/14 0630  vancomycin (VANCOCIN) 1,250 mg in sodium chloride 0.9 % 250 mL IVPB     1,250 mg 166.7 mL/hr over 90 Minutes Intravenous  Once 03/06/14 1610 03/06/14 0810        Assessment/Plan Acute cholecystitis s/p percutaneous cholecystostomy tube, on cefepime and flagyl.  Tolerating POs but appetite is very poor. 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  He is not taking much PO and is malnourished.  Will obtain a prealbumin.    -agree with antibiotics -further recommendations to follow after discussion with Dr.  Hulen Skains.      Erby Pian, [facility] Surgery Pager [phone]) For consults and floor pages call [phone])  03/09/2014 8:19 AM

## 2014-03-09 NOTE — Progress Notes (Signed)
Gabriel Lynch is a 69 y.o. male patient who transferred  from 22M 08, awake, alert  & orientated  X 2, Full Code, VSS - Blood pressure 152/95, pulse 88, temperature 98.2 F (36.8 C), temperature source Oral, resp. rate 19, height 5\' 10"  (1.778 m), weight 64.1 kg (141 lb 5 oz), SpO2 100 %., R/A, no c/o shortness of breath, no c/o chest pain, no distress noted. Non-Tele.   IV site WDL: internal jugular right saline locked, condition patent and no redness with a transparent dsg that's clean dry and intact.  Allergies:   Allergies  Allergen Reactions  . Influenza Vaccines Other (See Comments)    ON MAR     Past Medical History  Diagnosis Date  . Ulcerative colitis   . Diabetes mellitus without complication   . Hypertension   . Iron deficiency anemia   . Hypothyroidism     Pt orientation to unit, room and routine. SR up x 2, fall risk assessment complete with Patient and family verbalizing understanding of risks associated with falls. Pt verbalizes an understanding of how to use the call bell and to call for help before getting out of bed.  Skin, clean-dry- with blisters to left arm with foam dressing intact, quarter size wound to left hand with foam dressing intact also, foam dressing also to left lower calf from spider bite in June 2015, stage 4 to sacrum with ABD dressing CDI.     Will cont to monitor and assist as needed.  Lindalou Hose, RN 03/09/2014 8:17 PM

## 2014-03-09 NOTE — Consult Note (Signed)
WOC wound follow up [facility] following for assessment and plan of care to sacral wound. Please re-consult if further assistance is needed.  Thank-you,  Julien Girt MSN, [place], [place], [place], [place]

## 2014-03-09 NOTE — Clinical Social Work Note (Signed)
CSW consult acknowledged:  Patient already has completed psychosocial assessment. Patient and pt's family does not wish to return to [facility]. Clinical Social Worker has extended SNF search to other facilities in [place] and will present additional bed offers once received. Placement note started.   CSW will continue to follow pt and pt's family for continued support and to facilitate pt's discharge needs once medically stable.   Glendon Axe, MSW, LCSWA [phone] 03/09/2014 4:25 PM

## 2014-03-10 ENCOUNTER — Inpatient Hospital Stay
Admission: AD | Admit: 2014-03-10 | Discharge: 2014-04-30 | Disposition: A | Discharge status: Skilled Nursing Facility | Payer: Self-pay | Source: Ambulatory Visit | Attending: Internal Medicine | Admitting: Internal Medicine

## 2014-03-10 DIAGNOSIS — L89154 Pressure ulcer of sacral region, stage 4: Secondary | ICD-10-CM

## 2014-03-10 DIAGNOSIS — Z95828 Presence of other vascular implants and grafts: Secondary | ICD-10-CM

## 2014-03-10 DIAGNOSIS — Z992 Dependence on renal dialysis: Secondary | ICD-10-CM

## 2014-03-10 DIAGNOSIS — Z452 Encounter for adjustment and management of vascular access device: Secondary | ICD-10-CM

## 2014-03-10 LAB — RENAL FUNCTION PANEL
Albumin: 2.1 g/dL — ABNORMAL LOW (ref 3.5–5.2)
Anion gap: 7 (ref 5–15)
BUN: 34 mg/dL — ABNORMAL HIGH (ref 6–23)
CALCIUM: 8.2 mg/dL — AB (ref 8.4–10.5)
CO2: 27 mmol/L (ref 19–32)
Chloride: 100 mmol/L (ref 96–112)
Creatinine, Ser: 3.16 mg/dL — ABNORMAL HIGH (ref 0.50–1.35)
GFR calc Af Amer: 22 mL/min — ABNORMAL LOW (ref >90)
GFR, EST NON AFRICAN AMERICAN: 19 mL/min — AB (ref >90)
Glucose, Bld: 102 mg/dL — ABNORMAL HIGH (ref 70–99)
PHOSPHORUS: 1.7 mg/dL — AB (ref 2.3–4.6)
POTASSIUM: 3.7 mmol/L (ref 3.5–5.1)
SODIUM: 134 mmol/L — AB (ref 135–145)

## 2014-03-10 LAB — BODY FLUID CULTURE
Culture: NO GROWTH
Gram Stain: NONE SEEN

## 2014-03-10 LAB — URINE CULTURE
COLONY COUNT: NO GROWTH
CULTURE: NO GROWTH

## 2014-03-10 LAB — GLUCOSE, CAPILLARY
GLUCOSE-CAPILLARY: 134 mg/dL — AB (ref 70–99)
GLUCOSE-CAPILLARY: 105 mg/dL — AB (ref 70–99)
Glucose-Capillary: 72 mg/dL (ref 70–99)

## 2014-03-10 LAB — MAGNESIUM: MAGNESIUM: 2 mg/dL (ref 1.5–2.5)

## 2014-03-10 MED ORDER — INSULIN ASPART 100 UNIT/ML ~~LOC~~ SOLN: 0.0000 [IU] | SUBCUTANEOUS | Status: DC

## 2014-03-10 MED ORDER — PANTOPRAZOLE SODIUM 40 MG PO TBEC: 40.0000 mg | DELAYED_RELEASE_TABLET | Freq: Every day | ORAL | Status: DC

## 2014-03-10 MED ORDER — MUPIROCIN 2 % EX OINT: 1.0000 "application " | TOPICAL_OINTMENT | Freq: Two times a day (BID) | CUTANEOUS | Status: DC

## 2014-03-10 MED ORDER — METRONIDAZOLE IN NACL 5-0.79 MG/ML-% IV SOLN: 500.0000 mg | Freq: Three times a day (TID) | INTRAVENOUS | Status: DC

## 2014-03-10 MED ORDER — DEXTROSE 5 % IV SOLN: 1.0000 g | INTRAVENOUS | Status: DC

## 2014-03-10 NOTE — Discharge Summary (Signed)
PATIENT DETAILS Name: Gabriel Lynch Age: 69 y.o. Sex: male Date of Birth: 02/04/1945 MRN: [id number]. Admitting Physician: Rigoberto Noel, MD UXN:ATFT, SAAD, MD  Admit Date: 03/06/2014 Discharge date: 03/10/2014  Recommendations for Outpatient Follow-up:  1. Continue percutaneous cholecystostomy tube for at least 6 weeks, patient would then need referral to general surgery for consideration of cholecystectomy 2. 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  3. Please consult nephrology while at [facility] 4. Please follow blood, bile and urine cultures-till final 5. Still has right IJ hemodialysis catheter-please remove when appropriate 6. Daily CBC/Renal function 7. Patient appears to have a 1.61.3 cm mass in the superficial soft tissue at the left mandibular angle-likely sebaceous cyst, we will defer further workup to the outpatient setting. 8. Please obtain physical therapy, occupational therapy and speech therapy evaluation at [facility]. Currently on a dysphagia 2 diet.  PRIMARY DISCHARGE DIAGNOSIS:  Active Problems:   Septic shock   Acute cholecystitis   Stage IV pressure ulcer of sacral region   Acidosis   AKI (acute kidney injury)   Central line complication      PAST MEDICAL HISTORY: Past Medical History  Diagnosis Date  . Ulcerative colitis   . Diabetes mellitus without complication   . Hypertension   . Iron deficiency anemia   . Hypothyroidism     DISCHARGE MEDICATIONS: Current Discharge Medication List    START taking these medications   Details  ceFEPIme 1 g in dextrose 5 % 50 mL Inject 1 g into the vein daily.    insulin aspart (NOVOLOG) 100 UNIT/ML injection Inject 0-15 Units into the skin every 4 (four) hours. Qty: 10 mL, Refills: 11    metroNIDAZOLE (FLAGYL) 5-0.79 MG/ML-% IVPB Inject 100 mLs (500 mg total) into the vein every 8 (eight)  hours. Qty: 100 mL    mupirocin ointment (BACTROBAN) 2 % Place 1 application into the nose 2 (two) times daily. FOR 5 DAYS FROM 03/06/14 Qty: 22 g, Refills: 0    pantoprazole (PROTONIX) 40 MG tablet Take 1 tablet (40 mg total) by mouth daily.      CONTINUE these medications which have NOT CHANGED   Details  acetaminophen (TYLENOL) 325 MG tablet Take 650 mg by mouth every 4 (four) hours as needed for mild pain, fever or headache.    Amino Acids-Protein Hydrolys (FEEDING SUPPLEMENT, PRO-STAT SUGAR FREE 64,) LIQD Take 30 mLs by mouth 2 (two) times daily.    feeding supplement, GLUCERNA SHAKE, (GLUCERNA SHAKE) LIQD Take 237 mLs by mouth 2 (two) times daily between meals.    ferrous gluconate (FERGON) 324 MG tablet Take 324 mg by mouth 3 (three) times daily.    levothyroxine (SYNTHROID, LEVOTHROID) 100 MCG tablet Take 100 mcg by mouth daily before breakfast.    metoprolol succinate (TOPROL-XL) 100 MG 24 hr tablet Take 100 mg by mouth daily. Take with or immediately following a meal.    tamsulosin (FLOMAX) 0.4 MG CAPS capsule Take 0.4 mg by mouth daily after breakfast.      STOP taking these medications     amLODipine (NORVASC) 5 MG tablet      furosemide (LASIX) 20 MG tablet      glipiZIDE (GLUCOTROL) 10 MG tablet      metFORMIN (GLUCOPHAGE) 1000 MG tablet      Multiple Vitamin (MULTIVITAMIN WITH MINERALS) TABS tablet      Oxycodone HCl 10 MG TABS  potassium chloride (K-DUR,KLOR-CON) 10 MEQ tablet         ALLERGIES:   Allergies  Allergen Reactions  . Influenza Vaccines Other (See Comments)    ON MAR    BRIEF HPI:  See H&P, Labs, Consult and Test reports for all details in brief, patient is a 69 year olddiabetic, hypertensive, ulcerative colitis-had multiple surgeries in [facility] ultimately requiring colostomy who presented to the hospital on 2/19 with altered mental status/unresponsiveness outpatient was extremely hypotensive. Further evaluation  revealed significant hyperkalemia and acute renal failure. He was admitted for further evaluation and treatment  CONSULTATIONS:   pulmonary/intensive care, nephrology and general surgery  Interventional radiology  PERTINENT RADIOLOGIC STUDIES: Ct Abdomen Pelvis Wo Contrast  03/06/2014   CLINICAL DATA:  Patient unresponsive and intubated. Concern for sepsis with low body temperature. History of ulcerative colitis.  EXAM: CT CHEST, ABDOMEN AND PELVIS WITHOUT CONTRAST  TECHNIQUE: Multidetector CT imaging of the chest, abdomen and pelvis was performed following the standard protocol without IV contrast.  COMPARISON:  Chest x-ray ant [phone] hr  FINDINGS: CT CHEST FINDINGS  Endotracheal tube extends into the mid trachea. A nasogastric tube extends into the stomach. There is no evidence of pneumothorax with lungs demonstrating mild COPD and bleb formation in the peripheral lungs bilaterally. Lower lung zones demonstrate atelectasis and probable component of bilateral lower lobe mucous plugging in the posterior lower lobe bronchi. No pleural fluid is identified.  The heart size is normal. No pericardial effusion. The thoracic aorta shows atherosclerotic plaque without aneurysmal disease. A tiny amount of calcified plaque is suspected in the distribution of the LAD and distal left circumflex coronary arteries. The thyroid gland is unremarkable. No evidence of pneumomediastinum or mediastinal mass. No lymphadenopathy identified. Bony structures are unremarkable.  CT ABDOMEN AND PELVIS FINDINGS  Acute cholecystitis is suspected with significant amount of fluid surrounding the gallbladder. Although the gallbladder is not overtly distended, there may be subtle underlying gallstones. Some free fluid tracks around the medial edge of the liver and extends up to the liver dome. There are inflammatory changes in the region of the gastrohepatic ligament and also in the left upper quadrant immediately inferior to the stomach.  This appearance is nonspecific. Recommend correlation with laboratory evidence of pancreatitis. The pancreas itself appears fairly unremarkable by unenhanced CT.  There is evidence of an ileostomy in the right lower abdomen. The patient appears to be status post prior subtotal colectomy with only a rectum remaining. There is no evidence of bowel perforation or focal abscess. No free intraperitoneal air identified.  Small bowel loops are of normal caliber. No masses or lymphadenopathy identified in the abdomen or pelvis. No hernias are seen. The bladder contains a Foley catheter with some increased density seen surrounding the Foley catheter balloon. This may be consistent with some hole line in the bladder. Correlation suggested with urinary tract. Underlying bladder lesion cannot be excluded by unenhanced CT.  IMPRESSION: 1. Chest demonstrates mucous plug formation and atelectasis in both posterior lower lobes. There is underlying COPD. No pneumothorax is identified. 2. Mild coronary atherosclerotic disease suspected with calcified plaque in the distal LAD and left circumflex territory. 3. Evidence of probable acute cholecystitis with free fluid surrounding the gallbladder. Cholelithiasis is suspected. Ultrasound correlation may be helpful. Some free fluid tracks around the liver. 4. Nonspecific inflammatory changes in the region of the gastrohepatic ligament and inferior to the stomach. Some of this may be postoperative in nature given evidence of prior subtotal colectomy. Recommend correlation  with any laboratory evidence of pancreatitis. 5. No evidence of bowel perforation or obstruction. Status post subtotal colectomy. Right-sided ileostomy site appears uncomplicated. 6. High density in the bladder adjacent to the Foley catheter balloon. This may be reflective of blood given that IV contrast was not administered. Underlying bladder lesion cannot be excluded.   Electronically Signed   By: Aletta Edouard M.D.    On: 03/06/2014 09:22   Ct Head Wo Contrast  03/06/2014   ADDENDUM REPORT: 03/06/2014 09:18  ADDENDUM: On axial slice 56 series 481, there is a 1.6 x 1.3 cm mass in the superficial soft tissues at the level of the angle of the mandible on the left. Suspect sebaceous cyst, although a focal lymph node could present in this manner.   Electronically Signed   By: Lowella Grip III M.D.   On: 03/06/2014 09:18   03/06/2014   CLINICAL DATA:  Patient unresponsive  EXAM: CT HEAD WITHOUT CONTRAST  CT CERVICAL SPINE WITHOUT CONTRAST  TECHNIQUE: Multidetector CT imaging of the head and cervical spine was performed following the standard protocol without intravenous contrast. Multiplanar CT image reconstructions of the cervical spine were also generated.  COMPARISON:  None.  FINDINGS: CT HEAD FINDINGS  The ventricles are normal in size and configuration. There is no appreciable intracranial mass, hemorrhage, extra-axial fluid collection, or midline shift. There is a probable prominent prevascular space just lateral to the inferior aspect of the posterior limb of the left internal capsule. Gray-white compartments appear normal. No acute infarct apparent. Bony calvarium appears intact. The mastoid air cells are clear. The patient has had an antrostomy on the left. There is mucosal thickening in the inferior left maxillary antrum. There is a well-circumscribed rounded bony defect in the upper anterior hard palate. 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 There is evidence of previous surgery involving the anterior aspect of the right maxillary antrum wall.  CT CERVICAL SPINE FINDINGS  There is no fracture or spondylolisthesis. Prevertebral soft tissues and predental space regions are normal. Patient is intubated.  There is moderate disc space narrowing at C4-5, C5-6, and C6-7. There is mild narrowing at C3-4. There are central disc protrusions causing impression on the ventral cord at C3-4, C4-5, and C5-6. A smaller central  disc protrusion is noted at C2-3. There is a degree of spinal stenosis due to the central disc protrusions at C3-4, C4-5, and C5-6. No erosive change or bony destruction.  IMPRESSION: CT head: No intracranial mass, hemorrhage, or extra-axial fluid collection. No focal gray-white compartment lesions/acute appearing infarct. Postoperative change in the face. Prosthetic right globe. Well-circumscribed bony defect anterior hard palate. Etiology for this defect is uncertain. It should be amenable to visual inspection.  CT cervical spine: Multifocal osteoarthritic change. There is a degree of spinal stenosis due to central disc protrusion at C3-4, C4-5, and C5-6. No fracture or spondylolisthesis. No erosive change or bony destruction.  Electronically Signed: By: Lowella Grip III M.D. On: 03/06/2014 09:14   Ct Chest Wo Contrast  03/06/2014   CLINICAL DATA:  Patient unresponsive and intubated. Concern for sepsis with low body temperature. History of ulcerative colitis.  EXAM: CT CHEST, ABDOMEN AND PELVIS WITHOUT CONTRAST  TECHNIQUE: Multidetector CT imaging of the chest, abdomen and pelvis was performed following the standard protocol without IV contrast.  COMPARISON:  Chest x-ray ant [phone] hr  FINDINGS: CT CHEST FINDINGS  Endotracheal tube extends into the mid trachea. A nasogastric tube extends into the stomach. There is no  evidence of pneumothorax with lungs demonstrating mild COPD and bleb formation in the peripheral lungs bilaterally. Lower lung zones demonstrate atelectasis and probable component of bilateral lower lobe mucous plugging in the posterior lower lobe bronchi. No pleural fluid is identified.  The heart size is normal. No pericardial effusion. The thoracic aorta shows atherosclerotic plaque without aneurysmal disease. A tiny amount of calcified plaque is suspected in the distribution of the LAD and distal left circumflex coronary arteries. The thyroid gland is unremarkable. No evidence of  pneumomediastinum or mediastinal mass. No lymphadenopathy identified. Bony structures are unremarkable.  CT ABDOMEN AND PELVIS FINDINGS  Acute cholecystitis is suspected with significant amount of fluid surrounding the gallbladder. Although the gallbladder is not overtly distended, there may be subtle underlying gallstones. Some free fluid tracks around the medial edge of the liver and extends up to the liver dome. There are inflammatory changes in the region of the gastrohepatic ligament and also in the left upper quadrant immediately inferior to the stomach. This appearance is nonspecific. Recommend correlation with laboratory evidence of pancreatitis. The pancreas itself appears fairly unremarkable by unenhanced CT.  There is evidence of an ileostomy in the right lower abdomen. The patient appears to be status post prior subtotal colectomy with only a rectum remaining. There is no evidence of bowel perforation or focal abscess. No free intraperitoneal air identified.  Small bowel loops are of normal caliber. No masses or lymphadenopathy identified in the abdomen or pelvis. No hernias are seen. The bladder contains a Foley catheter with some increased density seen surrounding the Foley catheter balloon. This may be consistent with some hole line in the bladder. Correlation suggested with urinary tract. Underlying bladder lesion cannot be excluded by unenhanced CT.  IMPRESSION: 1. Chest demonstrates mucous plug formation and atelectasis in both posterior lower lobes. There is underlying COPD. No pneumothorax is identified. 2. Mild coronary atherosclerotic disease suspected with calcified plaque in the distal LAD and left circumflex territory. 3. Evidence of probable acute cholecystitis with free fluid surrounding the gallbladder. Cholelithiasis is suspected. Ultrasound correlation may be helpful. Some free fluid tracks around the liver. 4. Nonspecific inflammatory changes in the region of the gastrohepatic ligament  and inferior to the stomach. Some of this may be postoperative in nature given evidence of prior subtotal colectomy. Recommend correlation with any laboratory evidence of pancreatitis. 5. No evidence of bowel perforation or obstruction. Status post subtotal colectomy. Right-sided ileostomy site appears uncomplicated. 6. High density in the bladder adjacent to the Foley catheter balloon. This may be reflective of blood given that IV contrast was not administered. Underlying bladder lesion cannot be excluded.   Electronically Signed   By: Aletta Edouard M.D.   On: 03/06/2014 09:22   Ct Cervical Spine Wo Contrast  03/06/2014   ADDENDUM REPORT: 03/06/2014 09:18  ADDENDUM: On axial slice 56 series 045, there is a 1.6 x 1.3 cm mass in the superficial soft tissues at the level of the angle of the mandible on the left. Suspect sebaceous cyst, although a focal lymph node could present in this manner.   Electronically Signed   By: Lowella Grip III M.D.   On: 03/06/2014 09:18   03/06/2014   CLINICAL DATA:  Patient unresponsive  EXAM: CT HEAD WITHOUT CONTRAST  CT CERVICAL SPINE WITHOUT CONTRAST  TECHNIQUE: Multidetector CT imaging of the head and cervical spine was performed following the standard protocol without intravenous contrast. Multiplanar CT image reconstructions of the cervical spine were also generated.  COMPARISON:  None.  FINDINGS: CT HEAD FINDINGS  The ventricles are normal in size and configuration. There is no appreciable intracranial mass, hemorrhage, extra-axial fluid collection, or midline shift. There is a probable prominent prevascular space just lateral to the inferior aspect of the posterior limb of the left internal capsule. Gray-white compartments appear normal. No acute infarct apparent. Bony calvarium appears intact. The mastoid air cells are clear. The patient has had an antrostomy on the left. There is mucosal thickening in the inferior left maxillary antrum. There is a well-circumscribed  rounded bony defect in the upper anterior hard palate. 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 There is evidence of previous surgery involving the anterior aspect of the right maxillary antrum wall.  CT CERVICAL SPINE FINDINGS  There is no fracture or spondylolisthesis. Prevertebral soft tissues and predental space regions are normal. Patient is intubated.  There is moderate disc space narrowing at C4-5, C5-6, and C6-7. There is mild narrowing at C3-4. There are central disc protrusions causing impression on the ventral cord at C3-4, C4-5, and C5-6. A smaller central disc protrusion is noted at C2-3. There is a degree of spinal stenosis due to the central disc protrusions at C3-4, C4-5, and C5-6. No erosive change or bony destruction.  IMPRESSION: CT head: No intracranial mass, hemorrhage, or extra-axial fluid collection. No focal gray-white compartment lesions/acute appearing infarct. Postoperative change in the face. Prosthetic right globe. Well-circumscribed bony defect anterior hard palate. Etiology for this defect is uncertain. It should be amenable to visual inspection.  CT cervical spine: Multifocal osteoarthritic change. There is a degree of spinal stenosis due to central disc protrusion at C3-4, C4-5, and C5-6. No fracture or spondylolisthesis. No erosive change or bony destruction.  Electronically Signed: By: Lowella Grip III M.D. On: 03/06/2014 09:14   Ir Perc Cholecystostomy  03/06/2014   CLINICAL DATA:  Acute calculus cholecystitis  EXAM: Ultrasound percutaneous transhepatic cholecystostomy (portable)  Date:  2/19/20162/19/2016 1:52 pm  Radiologist:  Jerilynn Mages. Daryll Brod, MD  Guidance:  Ultrasound  FLUOROSCOPY TIME:  None.  MEDICATIONS AND MEDICAL HISTORY: 1% lidocaine locally. Patient is already sedated and receiving broad spectrum IV antibiotics daily.  ANESTHESIA/SEDATION: None.  CONTRAST:  None.  COMPLICATIONS: None immediate  PROCEDURE: Informed consent was obtained from the patient following  explanation of the procedure, risks, benefits and alternatives. The patient understands, agrees and consents for the procedure. All questions were addressed. A time out was performed.  Maximal barrier sterile technique utilized including caps, mask, sterile gowns, sterile gloves, large sterile drape, hand hygiene, and Betadine.  Previous imaging reviewed.  At the bedside, ultrasound was performed of the right upper quadrant. The gallbladder was localized just inferior to the right subcostal margin. There is diffuse wall thickening and edema. Gallbladder is not significantly distended. Large gallstone noted. Under sterile conditions and local anesthesia, a 21 gauge needle was advanced percutaneously through a transhepatic windowed into the gallbladder. Needle position confirmed with ultrasound. Images obtained for documentation. Guidewire advanced into the gallbladder. Guidewire position confirmed with ultrasound. Accustick dilator set advanced. Guidewire exchanged for an Amplatz guidewire. Guidewire again confirmed within the gallbladder with ultrasound. Tract dilatation performed to advance a 10 [place] drain. Retention loop formed in the gallbladder. Syringe aspiration yielded thick bile. Sample sent for Gram stain culture. Catheter secured with a Prolene suture and connected to external gravity drainage. Sterile dressing applied. No Immediate complication. Patient tolerated the procedure well.  IMPRESSION: Successful ultrasound percutaneous transhepatic cholecystostomy.   Electronically Signed  By: Eugenie Filler M.D.   On: 03/06/2014 14:13   Dg Chest Port 1 View  03/07/2014   CLINICAL DATA:  70 year old male with a history of respiratory failure.  EXAM: PORTABLE CHEST - 1 VIEW  COMPARISON:  Chest x-ray 03/06/2014, CT 03/06/2014  FINDINGS: Cardiomediastinal silhouette unchanged in size and contour.  Endotracheal tube appears to have been slightly advanced, previously measuring 6.2 cm from the carina, currently  measuring 5.7 cm from the carina.  Gastric tube projects over the mediastinum, terminating in the left upper quadrant. The side port appears to be above the GE junction.  Right IJ central venous catheter is unchanged in position with the tip terminating over the region of the superior vena cava.  Overlying EKG leads.  Similar appearance of streaky opacities throughout the lungs, extending from the hilar regions. No visualized pneumothorax. No large pleural effusion.  IMPRESSION: Similar appearance of the chest x-ray, with linear opacities extending from the hilar region, potentially representing atelectasis/ lung volume loss.  The endotracheal tube has been advanced slightly, terminating 5.7 cm above the carina.  Unchanged position of right IJ catheter.  Enteric tube terminates in the left upper quadrant, with the side port appearing to be above the GE junction.  Signed,  Dulcy Fanny. Earleen Newport, DO  [facility]  [facility]   Electronically Signed   By: Corrie Mckusick D.O.   On: 03/07/2014 07:35   Dg Chest Port 1 View  03/06/2014   CLINICAL DATA:  Central catheter placement  EXAM: PORTABLE CHEST - 1 VIEW  COMPARISON:  Chest radiograph and chest CT obtained earlier in the day  FINDINGS: Central catheter tip is in the superior vena cava. Endotracheal tube tip is 4.7 cm above the carina. Nasogastric tube tip and side port are in the stomach. No pneumothorax. There is patchy bibasilar atelectatic change. Elsewhere lungs are clear. Heart size and pulmonary vascularity are normal. No adenopathy.  IMPRESSION: Tube and catheter positions as described without pneumothorax. Patchy bibasilar atelectatic change. Lungs elsewhere clear. No change in cardiac silhouette.   Electronically Signed   By: Lowella Grip III M.D.   On: 03/06/2014 10:01   Dg Chest Portable 1 View  03/06/2014   CLINICAL DATA:  ET and OG tube placement.  EXAM: PORTABLE CHEST - 1 VIEW  COMPARISON:  None.   FINDINGS: Endotracheal tube is at the level of the clavicular heads. Orogastric tube extends into the stomach. The lungs are expanded. There is a deep costophrenic angle on the right, suggesting a small pneumothorax but this is not confirmed by identification of the pleural line. No large pneumothorax is evident. No effusion is evident.  IMPRESSION: ET and OG tube as described.  Questionable small right pneumothorax.   Electronically Signed   By: Andreas Newport M.D.   On: 03/06/2014 06:44     PERTINENT LAB RESULTS: CBC:  Recent Labs  03/08/14 0410 03/09/14 0419  WBC 24.3* 25.6*  HGB 6.3* 8.9*  HCT 19.3* 27.5*  PLT 448* 428*   CMET CMP     Component Value Date/Time   NA 134* 03/10/2014 0530   K 3.7 03/10/2014 0530   CL 100 03/10/2014 0530   CO2 27 03/10/2014 0530   GLUCOSE 102* 03/10/2014 0530   BUN 34* 03/10/2014 0530   CREATININE 3.16* 03/10/2014 0530   CALCIUM 8.2* 03/10/2014 0530   PROT 7.6 03/06/2014 0554   ALBUMIN 2.1* 03/10/2014 0530   AST 27 03/06/2014 0554   ALT 20  03/06/2014 0554   ALKPHOS 296* 03/06/2014 0554   BILITOT 0.3 03/06/2014 0554   GFRNONAA 19* 03/10/2014 0530   GFRAA 22* 03/10/2014 0530    GFR Estimated Creatinine Clearance: 21.2 mL/min (by C-G formula based on Cr of 3.16).  Recent Labs  03/09/14 0900  LIPASE 197*   No results for input(s): CKTOTAL, CKMB, CKMBINDEX, TROPONINI in the last 72 hours. Invalid input(s): POCBNP No results for input(s): DDIMER in the last 72 hours. No results for input(s): HGBA1C in the last 72 hours. No results for input(s): CHOL, HDL, LDLCALC, TRIG, CHOLHDL, LDLDIRECT in the last 72 hours. No results for input(s): TSH, T4TOTAL, T3FREE, THYROIDAB in the last 72 hours.  Invalid input(s): FREET3 No results for input(s): VITAMINB12, FOLATE, FERRITIN, TIBC, IRON, RETICCTPCT in the last 72 hours. Coags: No results for input(s): INR in the last 72 hours.  Invalid input(s): PT Microbiology: Recent Results (from  the past 240 hour(s))  Culture, blood (routine x 2)     Status: None (Preliminary result)   Collection Time: 03/06/14  6:28 AM  Result Value Ref Range Status   Specimen Description BLOOD NECK LEFT  Final   Special Requests BOTTLES DRAWN AEROBIC AND ANAEROBIC 10CC  Final   Culture   Final           BLOOD CULTURE RECEIVED NO GROWTH TO DATE CULTURE WILL BE HELD FOR 5 DAYS BEFORE ISSUING A FINAL NEGATIVE REPORT Performed at [facility]    Report Status PENDING  Incomplete  Culture, blood (routine x 2)     Status: None (Preliminary result)   Collection Time: 03/06/14  6:35 AM  Result Value Ref Range Status   Specimen Description BLOOD NECK RIGHT  Final   Special Requests BOTTLES DRAWN AEROBIC ONLY 8CC  Final   Culture   Final           BLOOD CULTURE RECEIVED NO GROWTH TO DATE CULTURE WILL BE HELD FOR 5 DAYS BEFORE ISSUING A FINAL NEGATIVE REPORT Performed at [facility]    Report Status PENDING  Incomplete  MRSA PCR Screening     Status: Abnormal   Collection Time: 03/06/14  9:41 AM  Result Value Ref Range Status   MRSA by PCR POSITIVE (A) NEGATIVE Final    Comment:        The GeneXpert MRSA Assay (FDA approved for NASAL specimens only), is one component of a comprehensive MRSA colonization surveillance program. It is not intended to diagnose MRSA infection nor to guide or monitor treatment for MRSA infections. RESULT CALLED TO, READ BACK BY AND VERIFIED WITH: S. HARDY RN 11:40 03/06/14 ([email or web address])   Body fluid culture     Status: None   Collection Time: 03/06/14  2:37 PM  Result Value Ref Range Status   Specimen Description BILE  Final   Special Requests NONE  Final   Gram Stain   Final    NO WBC SEEN NO ORGANISMS SEEN Performed at [facility]    Culture   Final    NO GROWTH 3 DAYS Performed at [facility]    Report Status 03/10/2014 FINAL  Final  Anaerobic culture     Status: None (Preliminary result)   Collection Time: 03/06/14   2:37 PM  Result Value Ref Range Status   Specimen Description BILE  Final   Special Requests NONE  Final   Gram Stain   Final    NO WBC SEEN NO ORGANISMS SEEN Performed at [facility]    Culture   Final    NO ANAEROBES ISOLATED; CULTURE IN PROGRESS FOR 5 DAYS Performed at [facility] COURSE:   Active Problems:   Septic shock: Resolved. 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 Patient was admitted to the intensive care unit, required vasopressor support and empiric antibiotics. Blood cultures, bile cultures continue to be negative so far. Urine culture on 2/22 pending. Blood pressure now stable, in fact Lopressor now being restarted on discharge.    Acute hypoxic respiratory failure: In the setting of septic shock. He required intubation and mechanical ventilation. Doing well since extubation on 2/20.    Acute encephalopathy: In a setting of septic shock, resolved.    Acute renal failure with hyperkalemia and severe metabolic acidosis: Likely ATN in the setting of septic shock.Patient was seen in consultation by nephrology, patient required CRRT and much improved. Now making urine, creatinine now down trending to 3.16 today from a peak of 8.59 on admission (on 03/06/14). Potassium and bicarbonate have also normalized. This M.D., spoke with nephrology (Dr. Florene Glen), okay to discharge to LTAC, patient would need nephrology follow-up while at [facility] he did    Acute cholecystitis: Likely contributing to septic shock. Underwent percutaneous cholecystostomy drain placement on 2/19. Will require continued drain placement for at least 6 weeks following which patient will need referral to general surgery for cholecystectomy. Currently continuing IV cefepime and IV Flagyl for cholecystitis, however suspect he will need prolonged IV antibiotics for presumed sacral  osteomyelitis.    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 Surrounding tissue is mostly necrotic. Patient currently on empiric cefepime and Flagyl which will be continued, patient would need prolonged IV antibiotics for at least 6 weeks. Please consult infectious disease while at [facility] for further guidance regarding antibiotics.    Anemia: Multifactorial but mostly due to acute illness. Did require 1 unit of PRBC so far. Last hemoglobin on 2/22 at 8.9. Please continue to monitor periodically while at LTAC    Leukemoid reaction: Likely from sepsis, continue to monitor CBC periodically.    History of hypertension: Hypotensive on presentation, blood pressure now creeping up. Resume metoprolol. Amlodipine continues to be on hold, resume when able    History of diabetes: All oral hypoglycemic agents continue to be on hold, continue to utilize SSI. When renal function stable, consider restarting oral hypoglycemic agents   History of ulcerative colitis status post colectomy and colostomy: Appears stable.   Hypothyroidism: Continue levothyroxine   Chronic indwelling Foley catheter: Per sister, patient has a chronic indwelling Foley catheter, during his prior hospitalization in another facility, voiding trial was unsuccessful. Resume Flomax, consider voiding trial in the next few weeks   1.61.3 cm mass in the superficial soft tissue at the left mandibular angle-likely sebaceous cyst:we will defer further workup to the outpatient setting.     TODAY-DAY OF DISCHARGE:  Subjective:   Chazz Fromer today has nono major complaints. Sister at bedside. Patient awake and alert.   Objective:   Blood pressure 164/78, pulse 88, temperature 99.1 F (37.3 C), temperature source Oral, resp. rate 20, height 5\' 10"  (1.778 m), weight 66.089 kg (145 lb 11.2 oz), SpO2 99 %.  Intake/Output Summary (Last 24 hours) at 03/10/14  1042 Last data filed at 03/10/14 0618  Gross per 24 hour  Intake    480 ml  Output   1686 ml  Net  -1206 ml   Filed Weights   03/08/14 0500 03/09/14 0600 03/10/14 0546  Weight: 66.4 kg (146 lb 6.2 oz) 64.1 kg (141 lb 5 oz) 66.089 kg (145 lb 11.2 oz)    Exam Awake Alert, Oriented *3, No new F.N deficits, Normal affect [place].AT,PERRAL Supple Neck,No JVD, No cervical lymphadenopathy appriciated.  Symmetrical Chest wall movement, Good air movement bilaterally, CTAB RRR,No Gallops,Rubs or new Murmurs, No Parasternal Heave  DISCHARGE CONDITION: Stable  DISPOSITION: LTACH  DISCHARGE INSTRUCTIONS:    Activity:  As tolerated with Full fall precautions use walker/cane & assistance as needed  Diet recommendation: Dysphagia 2 diet Aspiration precautions:yes  Discharge Instructions    Diet - low sodium heart healthy    Complete by:  As directed      Increase activity slowly    Complete by:  As directed           Follow-up Information    Follow up with [facility], MD. Schedule an appointment as soon as possible for a visit in 4 weeks.   Specialty:  General Surgery   Contact information:   [address] [place] [address] [phone]       Follow up with [name], MD. Schedule an appointment as soon as possible for a visit in 1 week.   Specialty:  Internal Medicine   Contact information:   [address] [place] [address] [phone]       Total Time spent on discharge equals 45 minutes.  SignedOren Binet 03/10/2014 10:42 AM

## 2014-03-10 NOTE — Discharge Instructions (Signed)
Cholecystostomy  The gallbladder is a pear-shaped organ that lies beneath the liver on the right side of the body. The gallbladder stores bile, a fluid that helps the body digest fats. However, sometimes bile and other fluids build up in the gallbladder because of an obstruction (for example, a gallstones). This can cause fever, pain, swelling, nausea and other serious symptoms. The procedure used to drain these fluids is called a cholecystostomy. A tube is inserted into the gallbladder. Fluid drains through the tube into a plastic bag outside the body. This procedure is usually done on people who are admitted to the hospital. The procedure is often recommended for people who cannot have gallbladder surgery right away, usually because they are too ill to make it through surgery. The cholecystostomy tube is usually temporary, until surgery can be done. RISKS AND COMPLICATIONS Although rare, complications can include:  Clogging of the tube.  Infection in or around the drain site. Antibiotics might be prescribed for the infection. Or, another tube might be inserted to drain the infected fluid.  Internal bleeding from the liver. BEFORE THE PROCEDURE   Try to quit smoking several weeks before the procedure. Smoking can slow healing.  Arrange for someone to drive you home from the hospital.  Right before your procedure, avoid all foods and liquids after midnight. This includes coffee, tea and water.  On the day of the procedure, arrive early to fill out all the paperwork. PROCEDURE You will be given a sedative to make you sleepy and a local anesthetic to numb the skin. Next, a small cut is made in the abdomen. Then a tube is threaded through the cut into the gallbladder. The procedure is usually done with ultrasound to guide the tube into the gallbladder. Once the tube is in place, the drain is secured to the skin with a stitch. The tube is then connected to a drainage bag.  AFTER THE PROCEDURE    People who have a cholecystostomy usually stay in the hospital for several days because they are so ill. You might not be able to eat for the first few days. Instead, you will be connected to an IV for fluids and nutrients.  The procedure does not cure the blockage that caused the fluid to build up in the first place. Because of this, the gallbladder will need to be removed in the future. The drain is removed at that time. HOME CARE INSTRUCTIONS  Be sure to follow your healthcare provider's instructions carefully. You may shower but avoid tub baths and swimming until your caregiver says it is OK. Eat and drink according to the directions you have been given. And be sure to make all follow-up appointments.  Call your healthcare provider if you notice new pain, redness or swelling around the wound. SEEK IMMEDIATE MEDICAL CARE IF:   There is increased abdominal pain.  Nausea or vomiting occurs.  You develop a fever.  The drainage tube comes out of the abdomen. Document Released: 03/31/2008 Document Revised: 03/27/2011 Document Reviewed: 03/31/2008 [facility] Patient Information 2015 [place], [place]. This information is not intended to replace advice given to you by your health care provider. Make sure you discuss any questions you have with your health care provider.   Dressing Change 2-3 times a day A dressing is a material placed over wounds. It keeps the wound clean, dry, and protected from further injury. This provides an environment that favors wound healing.  BEFORE YOU BEGIN  Get your supplies together. Things you may  need include:  Saline solution.  Flexible gauze dressing.  Medicated cream.  Tape.  Gloves.  Abdominal dressing pads.  Gauze squares.  Plastic bags.  Take pain medicine 30 minutes before the dressing change if you need it.  Take a shower before you do the first dressing change of the day. Use plastic wrap or a plastic bag to prevent the dressing from  getting wet. REMOVING YOUR OLD DRESSING   Wash your hands with soap and water. Dry your hands with a clean towel.  Put on your gloves.  Remove any tape.  Carefully remove the old dressing. If the dressing sticks, you may dampen it with warm water to loosen it, or follow your caregiver's specific directions.  Remove any gauze or packing tape that is in your wound.  Take off your gloves.  Put the gloves, tape, gauze, or any packing tape into a plastic bag. CHANGING YOUR DRESSING  Open the supplies.  Take the cap off the saline solution.  Open the gauze package so that the gauze remains on the inside of the package.  Put on your gloves.  Clean your wound as told by your caregiver.  If you have been told to keep your wound dry, follow those instructions.  Your caregiver may tell you to do one or more of the following:  Pick up the gauze. Pour the saline solution over the gauze. Squeeze out the extra saline solution.  Put medicated cream or other medicine on your wound if you have been told to do so.  Put the solution soaked gauze only in your wound, not on the skin around it.  Pack your wound loosely or as told by your caregiver.  Put dry gauze on your wound.  Put abdominal dressing pads over the dry gauze if your wet gauze soaks through.  Tape the abdominal dressing pads in place so they will not fall off. Do not wrap the tape completely around the affected part (arm, leg, abdomen).  Wrap the dressing pads with a flexible gauze dressing to secure it in place.  Take off your gloves. Put them in the plastic bag with the old dressing. Tie the bag shut and throw it away.  Keep the dressing clean and dry until your next dressing change.  Wash your hands. SEEK MEDICAL CARE IF:  Your skin around the wound looks red.  Your wound feels more tender or sore.  You see pus in the wound.  Your wound smells bad.  You have a fever.  Your skin around the wound has a rash  that itches and burns.  You see black or yellow skin in your wound that was not there before.  You feel nauseous, throw up, and feel very tired. Document Released: 02/10/2004 Document Revised: 03/27/2011 Document Reviewed: 11/14/2010 [facility] Patient Information 2015 [place], [place]. This information is not intended to replace advice given to you by your health care provider. Make sure you discuss any questions you have with your health care provider.

## 2014-03-10 NOTE — Progress Notes (Signed)
Patient ID: Gabriel Lynch, male   DOB: August 06, 1945, 69 y.o.   MRN: [id number]    Subjective: Pt feels well today.  No abdominal pain.  Tolerating a regular diet.    Objective: Vital signs in last 24 hours: Temp:  [94.7 F (34.8 C)-99.1 F (37.3 C)] 99.1 F (37.3 C) (02/23 0543) Pulse Rate:  [71-90] 88 (02/22 2008) Resp:  [9-21] 20 (02/23 0543) BP: (127-170)/(65-95) 164/78 mmHg (02/23 0543) SpO2:  [99 %-100 %] 99 % (02/23 0543) Weight:  [145 lb 11.2 oz (66.089 kg)] 145 lb 11.2 oz (66.089 kg) (02/23 0546) Last BM Date: 03/06/14  Intake/Output from previous day: 02/22 0701 - 02/23 0700 In: 730 [P.O.:240; I.V.:90; IV Piggyback:400] Out: 1936 [Urine:1680; Drains:256] Intake/Output this shift:    PE: Abd: soft, NT, Nd, +BS, perc chole drain with bilious output, clear Skin: sacral decubitus is a stage 4.  His sacrum is exposed.  The surrounding tissue is mostly necrotic, but the inferior portion actually is improving from when it was viewed last week.  The malodorous smell is now gone.  Lab Results:   Recent Labs  03/08/14 0410 03/09/14 0419  WBC 24.3* 25.6*  HGB 6.3* 8.9*  HCT 19.3* 27.5*  PLT 448* 428*   BMET  Recent Labs  03/09/14 0419 03/10/14 0530  NA 136 134*  K 3.8 3.7  CL 104 100  CO2 27 27  GLUCOSE 109* 102*  BUN 24* 34*  CREATININE 2.25* 3.16*  CALCIUM 8.1* 8.2*   PT/INR No results for input(s): LABPROT, INR in the last 72 hours. CMP     Component Value Date/Time   NA 134* 03/10/2014 0530   K 3.7 03/10/2014 0530   CL 100 03/10/2014 0530   CO2 27 03/10/2014 0530   GLUCOSE 102* 03/10/2014 0530   BUN 34* 03/10/2014 0530   CREATININE 3.16* 03/10/2014 0530   CALCIUM 8.2* 03/10/2014 0530   PROT 7.6 03/06/2014 0554   ALBUMIN 2.1* 03/10/2014 0530   AST 27 03/06/2014 0554   ALT 20 03/06/2014 0554   ALKPHOS 296* 03/06/2014 0554   BILITOT 0.3 03/06/2014 0554   GFRNONAA 19* 03/10/2014 0530   GFRAA 22* 03/10/2014 0530   Lipase     Component Value  Date/Time   LIPASE 197* 03/09/2014 0900       Studies/Results: No results found.  Anti-infectives: Anti-infectives    Start     Dose/Rate Route Frequency Ordered Stop   03/10/14 0856  ceFEPIme (MAXIPIME) 1 g in dextrose 5 % 50 mL IVPB     1 g 100 mL/hr over 30 Minutes Intravenous Every 24 hours 03/09/14 1106     03/09/14 1200  metroNIDAZOLE (FLAGYL) IVPB 500 mg     500 mg 100 mL/hr over 60 Minutes Intravenous Every 8 hours 03/09/14 1112     03/07/14 1200  metroNIDAZOLE (FLAGYL) IVPB 500 mg  Status:  Discontinued     500 mg 100 mL/hr over 60 Minutes Intravenous Every 8 hours 03/07/14 0940 03/09/14 1103   03/07/14 0600  vancomycin (VANCOCIN) IVPB 750 mg/150 ml premix  Status:  Discontinued     750 mg 150 mL/hr over 60 Minutes Intravenous Every 24 hours 03/06/14 1311 03/09/14 1103   03/06/14 2200  ceFEPIme (MAXIPIME) 2 g in dextrose 5 % 50 mL IVPB  Status:  Discontinued     2 g 100 mL/hr over 30 Minutes Intravenous Every 12 hours 03/06/14 1311 03/09/14 1106   03/06/14 1230  piperacillin-tazobactam (ZOSYN) IVPB 3.375 g  3.375 g 100 mL/hr over 30 Minutes Intravenous  Once 03/06/14 1200 03/06/14 1316   03/06/14 1200  piperacillin-tazobactam (ZOSYN) IVPB 3.375 g  Status:  Discontinued     3.375 g 12.5 mL/hr over 240 Minutes Intravenous  Once 03/06/14 1156 03/06/14 1159   03/06/14 0630  ceFEPIme (MAXIPIME) 1 g in dextrose 5 % 50 mL IVPB     1 g 100 mL/hr over 30 Minutes Intravenous  Once 03/06/14 0619 03/06/14 0703   03/06/14 0630  vancomycin (VANCOCIN) 1,250 mg in sodium chloride 0.9 % 250 mL IVPB     1,250 mg 166.7 mL/hr over 90 Minutes Intravenous  Once 03/06/14 0621 03/06/14 0810       Assessment/Plan  1. Septic shock, improving 2. ARF, improved, but Cr now worsening to 3 3. Leukocytosis, increased to 25K  4. Acute cholecystitis, s/p perc chole 5. Stage 4 sacral decubitus with osteomyelitis  Plan: 1. The patient is doing well with his perc chole drain.  He likely  needs a total of 7 days of abx therapy for this problem. 2.  However, by definition he has osteomyelitis given his significantly exposed sacrum.  This could be a source of his sepsis.  I will defer duration and further abx treatment to primary team +/- ID if felt warranted. 3. He will need to follow up with Dr. Grandville Silos in 4 weeks or after discharge from [facility] to evaluate for cholecystectomy.  LOS: 4 days    Devanta Daniel E 03/10/2014, 8:11 AM Pager: [phone]

## 2014-03-10 NOTE — Clinical Social Work Note (Signed)
Clinical Social Worker continuing to follow patient and family for support and discharge planning needs.  Per CM, patient has been approved for [facility] with plans to transfer today.  Clinical Social Worker will sign off for now as social work intervention is no longer needed. Please consult [place] again if new need arises.  Barbette Or, [place]

## 2014-03-10 NOTE — Plan of Care (Signed)
Problem: Phase I Progression Outcomes Goal: Voiding-avoid urinary catheter unless indicated Outcome: Not Met (add Reason) Unhealed sacral ulcer  Problem: Phase II Progression Outcomes Goal: Obtain order to discontinue catheter if appropriate Outcome: Not Met (add Reason) Unhealed sacral ulcer  Problem: Phase III Progression Outcomes Goal: Voiding independently Outcome: Not Met (add Reason) Unhealed sacral ulcer Goal: Foley discontinued Outcome: Not Met (add Reason) Unhealed sacral ulcer

## 2014-03-11 LAB — ANAEROBIC CULTURE: Gram Stain: NONE SEEN

## 2014-03-11 LAB — RENAL FUNCTION PANEL
Albumin: 1.8 g/dL — ABNORMAL LOW (ref 3.5–5.2)
Anion gap: 12 (ref 5–15)
BUN: 41 mg/dL — ABNORMAL HIGH (ref 6–23)
CHLORIDE: 97 mmol/L (ref 96–112)
CO2: 23 mmol/L (ref 19–32)
Calcium: 8.1 mg/dL — ABNORMAL LOW (ref 8.4–10.5)
Creatinine, Ser: 3.72 mg/dL — ABNORMAL HIGH (ref 0.50–1.35)
GFR calc Af Amer: 18 mL/min — ABNORMAL LOW (ref >90)
GFR, EST NON AFRICAN AMERICAN: 15 mL/min — AB (ref >90)
GLUCOSE: 81 mg/dL (ref 70–99)
POTASSIUM: 3.3 mmol/L — AB (ref 3.5–5.1)
Phosphorus: 2.9 mg/dL (ref 2.3–4.6)
SODIUM: 132 mmol/L — AB (ref 135–145)

## 2014-03-11 LAB — CBC WITH DIFFERENTIAL/PLATELET
BASOS ABS: 0 10*3/uL (ref 0.0–0.1)
BASOS PCT: 0 % (ref 0–1)
Eosinophils Absolute: 0.2 10*3/uL (ref 0.0–0.7)
Eosinophils Relative: 1 % (ref 0–5)
HCT: 28.8 % — ABNORMAL LOW (ref 39.0–52.0)
Hemoglobin: 9 g/dL — ABNORMAL LOW (ref 13.0–17.0)
LYMPHS PCT: 7 % — AB (ref 12–46)
Lymphs Abs: 1.6 10*3/uL (ref 0.7–4.0)
MCH: 26.5 pg (ref 26.0–34.0)
MCHC: 31.3 g/dL (ref 30.0–36.0)
MCV: 85 fL (ref 78.0–100.0)
MONO ABS: 2.1 10*3/uL — AB (ref 0.1–1.0)
Monocytes Relative: 10 % (ref 3–12)
NEUTROS ABS: 18 10*3/uL — AB (ref 1.7–7.7)
NEUTROS PCT: 82 % — AB (ref 43–77)
PLATELETS: 424 10*3/uL — AB (ref 150–400)
RBC: 3.39 MIL/uL — ABNORMAL LOW (ref 4.22–5.81)
RDW: 17.2 % — AB (ref 11.5–15.5)
WBC: 22 10*3/uL — ABNORMAL HIGH (ref 4.0–10.5)

## 2014-03-11 LAB — SEDIMENTATION RATE: SED RATE: 48 mm/h — AB (ref 0–16)

## 2014-03-11 LAB — HEPATIC FUNCTION PANEL
ALBUMIN: 1.8 g/dL — AB (ref 3.5–5.2)
ALT: 16 U/L (ref 0–53)
AST: 19 U/L (ref 0–37)
Alkaline Phosphatase: 156 U/L — ABNORMAL HIGH (ref 39–117)
BILIRUBIN TOTAL: 0.7 mg/dL (ref 0.3–1.2)
Total Protein: 5.2 g/dL — ABNORMAL LOW (ref 6.0–8.3)

## 2014-03-11 LAB — C-REACTIVE PROTEIN: CRP: 6.6 mg/dL — AB (ref <0.60)

## 2014-03-12 LAB — BASIC METABOLIC PANEL
ANION GAP: 12 (ref 5–15)
BUN: 51 mg/dL — ABNORMAL HIGH (ref 6–23)
CALCIUM: 8 mg/dL — AB (ref 8.4–10.5)
CO2: 21 mmol/L (ref 19–32)
CREATININE: 3.7 mg/dL — AB (ref 0.50–1.35)
Chloride: 96 mmol/L (ref 96–112)
GFR calc Af Amer: 18 mL/min — ABNORMAL LOW (ref >90)
GFR calc non Af Amer: 16 mL/min — ABNORMAL LOW (ref >90)
Glucose, Bld: 126 mg/dL — ABNORMAL HIGH (ref 70–99)
Potassium: 3.4 mmol/L — ABNORMAL LOW (ref 3.5–5.1)
Sodium: 129 mmol/L — ABNORMAL LOW (ref 135–145)

## 2014-03-12 LAB — CULTURE, BLOOD (ROUTINE X 2)
Culture: NO GROWTH
Culture: NO GROWTH

## 2014-03-14 LAB — CBC
HCT: 25 % — ABNORMAL LOW (ref 39.0–52.0)
HEMOGLOBIN: 7.9 g/dL — AB (ref 13.0–17.0)
MCH: 27.4 pg (ref 26.0–34.0)
MCHC: 31.6 g/dL (ref 30.0–36.0)
MCV: 86.8 fL (ref 78.0–100.0)
Platelets: 481 10*3/uL — ABNORMAL HIGH (ref 150–400)
RBC: 2.88 MIL/uL — ABNORMAL LOW (ref 4.22–5.81)
RDW: 17.6 % — AB (ref 11.5–15.5)
WBC: 13.8 10*3/uL — ABNORMAL HIGH (ref 4.0–10.5)

## 2014-03-14 LAB — BASIC METABOLIC PANEL
ANION GAP: 9 (ref 5–15)
BUN: 58 mg/dL — ABNORMAL HIGH (ref 6–23)
CALCIUM: 8.3 mg/dL — AB (ref 8.4–10.5)
CO2: 22 mmol/L (ref 19–32)
CREATININE: 3.3 mg/dL — AB (ref 0.50–1.35)
Chloride: 101 mmol/L (ref 96–112)
GFR calc Af Amer: 21 mL/min — ABNORMAL LOW (ref >90)
GFR calc non Af Amer: 18 mL/min — ABNORMAL LOW (ref >90)
GLUCOSE: 205 mg/dL — AB (ref 70–99)
POTASSIUM: 3.7 mmol/L (ref 3.5–5.1)
Sodium: 132 mmol/L — ABNORMAL LOW (ref 135–145)

## 2014-03-15 LAB — BASIC METABOLIC PANEL
ANION GAP: 8 (ref 5–15)
BUN: 56 mg/dL — ABNORMAL HIGH (ref 6–23)
CHLORIDE: 107 mmol/L (ref 96–112)
CO2: 21 mmol/L (ref 19–32)
Calcium: 8.7 mg/dL (ref 8.4–10.5)
Creatinine, Ser: 2.75 mg/dL — ABNORMAL HIGH (ref 0.50–1.35)
GFR calc non Af Amer: 22 mL/min — ABNORMAL LOW (ref >90)
GFR, EST AFRICAN AMERICAN: 26 mL/min — AB (ref >90)
GLUCOSE: 108 mg/dL — AB (ref 70–99)
Potassium: 3.7 mmol/L (ref 3.5–5.1)
SODIUM: 136 mmol/L (ref 135–145)

## 2014-03-15 LAB — C-REACTIVE PROTEIN: CRP: 2.4 mg/dL — AB (ref <0.60)

## 2014-03-15 LAB — SEDIMENTATION RATE: Sed Rate: 75 mm/hr — ABNORMAL HIGH (ref 0–16)

## 2014-03-16 LAB — CBC WITH DIFFERENTIAL/PLATELET
BASOS ABS: 0 10*3/uL (ref 0.0–0.1)
Basophils Relative: 0 % (ref 0–1)
EOS ABS: 0.3 10*3/uL (ref 0.0–0.7)
Eosinophils Relative: 3 % (ref 0–5)
HCT: 23.5 % — ABNORMAL LOW (ref 39.0–52.0)
Hemoglobin: 7.6 g/dL — ABNORMAL LOW (ref 13.0–17.0)
LYMPHS ABS: 2.1 10*3/uL (ref 0.7–4.0)
Lymphocytes Relative: 18 % (ref 12–46)
MCH: 27.8 pg (ref 26.0–34.0)
MCHC: 32.3 g/dL (ref 30.0–36.0)
MCV: 86.1 fL (ref 78.0–100.0)
Monocytes Absolute: 1.8 10*3/uL — ABNORMAL HIGH (ref 0.1–1.0)
Monocytes Relative: 16 % — ABNORMAL HIGH (ref 3–12)
NEUTROS PCT: 63 % (ref 43–77)
Neutro Abs: 7.2 10*3/uL (ref 1.7–7.7)
PLATELETS: 584 10*3/uL — AB (ref 150–400)
RBC: 2.73 MIL/uL — ABNORMAL LOW (ref 4.22–5.81)
RDW: 18 % — AB (ref 11.5–15.5)
WBC: 11.5 10*3/uL — AB (ref 4.0–10.5)

## 2014-03-16 LAB — BASIC METABOLIC PANEL
ANION GAP: 7 (ref 5–15)
BUN: 61 mg/dL — AB (ref 6–23)
CHLORIDE: 109 mmol/L (ref 96–112)
CO2: 20 mmol/L (ref 19–32)
Calcium: 8.8 mg/dL (ref 8.4–10.5)
Creatinine, Ser: 2.95 mg/dL — ABNORMAL HIGH (ref 0.50–1.35)
GFR calc Af Amer: 24 mL/min — ABNORMAL LOW (ref >90)
GFR calc non Af Amer: 21 mL/min — ABNORMAL LOW (ref >90)
Glucose, Bld: 155 mg/dL — ABNORMAL HIGH (ref 70–99)
Potassium: 4.1 mmol/L (ref 3.5–5.1)
Sodium: 136 mmol/L (ref 135–145)

## 2014-03-17 LAB — HEMOGLOBIN A1C
Hgb A1c MFr Bld: 6.3 % — ABNORMAL HIGH (ref 4.8–5.6)
Mean Plasma Glucose: 134 mg/dL

## 2014-03-18 ENCOUNTER — Encounter (HOSPITAL_BASED_OUTPATIENT_CLINIC_OR_DEPARTMENT_OTHER): Payer: Medicare Other | Attending: Surgery

## 2014-03-18 LAB — RENAL FUNCTION PANEL
ALBUMIN: 1.7 g/dL — AB (ref 3.5–5.2)
ANION GAP: 7 (ref 5–15)
BUN: 57 mg/dL — ABNORMAL HIGH (ref 6–23)
CO2: 19 mmol/L (ref 19–32)
CREATININE: 2.65 mg/dL — AB (ref 0.50–1.35)
Calcium: 8.6 mg/dL (ref 8.4–10.5)
Chloride: 112 mmol/L (ref 96–112)
GFR calc Af Amer: 27 mL/min — ABNORMAL LOW (ref >90)
GFR calc non Af Amer: 23 mL/min — ABNORMAL LOW (ref >90)
Glucose, Bld: 120 mg/dL — ABNORMAL HIGH (ref 70–99)
POTASSIUM: 4.2 mmol/L (ref 3.5–5.1)
Phosphorus: 4.2 mg/dL (ref 2.3–4.6)
Sodium: 138 mmol/L (ref 135–145)

## 2014-03-18 LAB — CBC
HCT: 20.8 % — ABNORMAL LOW (ref 39.0–52.0)
Hemoglobin: 6.6 g/dL — CL (ref 13.0–17.0)
MCH: 27.4 pg (ref 26.0–34.0)
MCHC: 31.7 g/dL (ref 30.0–36.0)
MCV: 86.3 fL (ref 78.0–100.0)
PLATELETS: 586 10*3/uL — AB (ref 150–400)
RBC: 2.41 MIL/uL — AB (ref 4.22–5.81)
RDW: 18.3 % — AB (ref 11.5–15.5)
WBC: 11.2 10*3/uL — AB (ref 4.0–10.5)

## 2014-03-18 LAB — IRON AND TIBC
Iron: 12 ug/dL — ABNORMAL LOW (ref 42–165)
Saturation Ratios: 7 % — ABNORMAL LOW (ref 20–55)
TIBC: 162 ug/dL — ABNORMAL LOW (ref 215–435)
UIBC: 150 ug/dL (ref 125–400)

## 2014-03-18 LAB — PREPARE RBC (CROSSMATCH)

## 2014-03-18 LAB — HEPATITIS B SURFACE ANTIGEN: Hepatitis B Surface Ag: NEGATIVE

## 2014-03-18 LAB — FERRITIN: FERRITIN: 693 ng/mL — AB (ref 22–322)

## 2014-03-19 LAB — HEMOGLOBIN AND HEMATOCRIT, BLOOD
HEMATOCRIT: 27.4 % — AB (ref 39.0–52.0)
Hemoglobin: 8.9 g/dL — ABNORMAL LOW (ref 13.0–17.0)

## 2014-03-19 LAB — TYPE AND SCREEN
ABO/RH(D): A POS
Antibody Screen: NEGATIVE
Unit division: 0
Unit division: 0

## 2014-03-19 LAB — PTH, INTACT AND CALCIUM: CALCIUM TOTAL (PTH): 8.5 mg/dL — AB (ref 8.6–10.2)

## 2014-03-19 LAB — TRANSFERRIN: TRANSFERRIN: 153 mg/dL — AB (ref 200–370)

## 2014-03-19 LAB — HEPATITIS B CORE ANTIBODY, TOTAL: Hep B Core Total Ab: NEGATIVE

## 2014-03-19 LAB — HEPATITIS B SURFACE ANTIBODY, QUANTITATIVE

## 2014-03-20 LAB — CBC
HEMATOCRIT: 26.9 % — AB (ref 39.0–52.0)
Hemoglobin: 8.6 g/dL — ABNORMAL LOW (ref 13.0–17.0)
MCH: 28.4 pg (ref 26.0–34.0)
MCHC: 32 g/dL (ref 30.0–36.0)
MCV: 88.8 fL (ref 78.0–100.0)
Platelets: 559 10*3/uL — ABNORMAL HIGH (ref 150–400)
RBC: 3.03 MIL/uL — AB (ref 4.22–5.81)
RDW: 18.3 % — ABNORMAL HIGH (ref 11.5–15.5)
WBC: 13.1 10*3/uL — ABNORMAL HIGH (ref 4.0–10.5)

## 2014-03-20 LAB — RENAL FUNCTION PANEL
Albumin: 1.9 g/dL — ABNORMAL LOW (ref 3.5–5.2)
Anion gap: 10 (ref 5–15)
BUN: 57 mg/dL — ABNORMAL HIGH (ref 6–23)
CO2: 15 mmol/L — AB (ref 19–32)
CREATININE: 2.53 mg/dL — AB (ref 0.50–1.35)
Calcium: 8.8 mg/dL (ref 8.4–10.5)
Chloride: 113 mmol/L — ABNORMAL HIGH (ref 96–112)
GFR calc Af Amer: 29 mL/min — ABNORMAL LOW (ref >90)
GFR calc non Af Amer: 25 mL/min — ABNORMAL LOW (ref >90)
Glucose, Bld: 92 mg/dL (ref 70–99)
PHOSPHORUS: 4.3 mg/dL (ref 2.3–4.6)
Potassium: 3.9 mmol/L (ref 3.5–5.1)
Sodium: 138 mmol/L (ref 135–145)

## 2014-03-21 LAB — HEMOGLOBIN AND HEMATOCRIT, BLOOD
HEMATOCRIT: 27.6 % — AB (ref 39.0–52.0)
Hemoglobin: 8.9 g/dL — ABNORMAL LOW (ref 13.0–17.0)

## 2014-03-24 LAB — HEMOGLOBIN AND HEMATOCRIT, BLOOD
HEMATOCRIT: 26.7 % — AB (ref 39.0–52.0)
HEMOGLOBIN: 8.6 g/dL — AB (ref 13.0–17.0)

## 2014-03-28 LAB — CBC
HCT: 32.3 % — ABNORMAL LOW (ref 39.0–52.0)
Hemoglobin: 10.1 g/dL — ABNORMAL LOW (ref 13.0–17.0)
MCH: 28.9 pg (ref 26.0–34.0)
MCHC: 31.3 g/dL (ref 30.0–36.0)
MCV: 92.3 fL (ref 78.0–100.0)
Platelets: 607 10*3/uL — ABNORMAL HIGH (ref 150–400)
RBC: 3.5 MIL/uL — ABNORMAL LOW (ref 4.22–5.81)
RDW: 22.3 % — ABNORMAL HIGH (ref 11.5–15.5)
WBC: 10.1 10*3/uL (ref 4.0–10.5)

## 2014-03-28 LAB — BASIC METABOLIC PANEL
Anion gap: 5 (ref 5–15)
BUN: 49 mg/dL — ABNORMAL HIGH (ref 6–23)
CALCIUM: 9.1 mg/dL (ref 8.4–10.5)
CO2: 20 mmol/L (ref 19–32)
Chloride: 114 mmol/L — ABNORMAL HIGH (ref 96–112)
Creatinine, Ser: 1.7 mg/dL — ABNORMAL HIGH (ref 0.50–1.35)
GFR calc Af Amer: 46 mL/min — ABNORMAL LOW (ref >90)
GFR, EST NON AFRICAN AMERICAN: 40 mL/min — AB (ref >90)
Glucose, Bld: 122 mg/dL — ABNORMAL HIGH (ref 70–99)
Potassium: 4.4 mmol/L (ref 3.5–5.1)
Sodium: 139 mmol/L (ref 135–145)

## 2014-04-01 ENCOUNTER — Other Ambulatory Visit (HOSPITAL_COMMUNITY): Payer: Self-pay

## 2014-04-01 MED ORDER — CHLORHEXIDINE GLUCONATE 4 % EX LIQD
CUTANEOUS | Status: AC
  Filled 2014-04-01: qty 15

## 2014-04-01 MED ORDER — LIDOCAINE HCL 1 % IJ SOLN
INTRAMUSCULAR | Status: AC
  Filled 2014-04-01: qty 20

## 2014-04-01 NOTE — Procedures (Signed)
Pt here to have trialysis temp hd catheter removed.  Pt id'ed via name and DOB.  Heparin removed from both lumens, cath cleaned with chlorhexadine.  Cath removed, pressure applied until hemostasis, dressing applied.  jkc

## 2014-04-02 ENCOUNTER — Other Ambulatory Visit (HOSPITAL_COMMUNITY): Payer: Self-pay

## 2014-04-02 ENCOUNTER — Other Ambulatory Visit (HOSPITAL_COMMUNITY): Payer: Medicare Other

## 2014-04-02 LAB — CBC WITH DIFFERENTIAL/PLATELET
Basophils Absolute: 0.1 10*3/uL (ref 0.0–0.1)
Basophils Relative: 1 % (ref 0–1)
Eosinophils Absolute: 0.3 10*3/uL (ref 0.0–0.7)
Eosinophils Relative: 3 % (ref 0–5)
HCT: 36.7 % — ABNORMAL LOW (ref 39.0–52.0)
HEMOGLOBIN: 11.6 g/dL — AB (ref 13.0–17.0)
Lymphocytes Relative: 23 % (ref 12–46)
Lymphs Abs: 2.5 10*3/uL (ref 0.7–4.0)
MCH: 29.8 pg (ref 26.0–34.0)
MCHC: 31.6 g/dL (ref 30.0–36.0)
MCV: 94.3 fL (ref 78.0–100.0)
Monocytes Absolute: 1.5 10*3/uL — ABNORMAL HIGH (ref 0.1–1.0)
Monocytes Relative: 14 % — ABNORMAL HIGH (ref 3–12)
Neutro Abs: 6.6 10*3/uL (ref 1.7–7.7)
Neutrophils Relative %: 59 % (ref 43–77)
Platelets: 621 10*3/uL — ABNORMAL HIGH (ref 150–400)
RBC: 3.89 MIL/uL — ABNORMAL LOW (ref 4.22–5.81)
RDW: 22.9 % — AB (ref 11.5–15.5)
Smear Review: INCREASED
WBC: 11 10*3/uL — ABNORMAL HIGH (ref 4.0–10.5)

## 2014-04-02 LAB — BASIC METABOLIC PANEL
ANION GAP: 8 (ref 5–15)
BUN: 47 mg/dL — ABNORMAL HIGH (ref 6–23)
CALCIUM: 9.3 mg/dL (ref 8.4–10.5)
CHLORIDE: 115 mmol/L — AB (ref 96–112)
CO2: 17 mmol/L — AB (ref 19–32)
CREATININE: 1.54 mg/dL — AB (ref 0.50–1.35)
GFR calc non Af Amer: 45 mL/min — ABNORMAL LOW (ref >90)
GFR, EST AFRICAN AMERICAN: 52 mL/min — AB (ref >90)
GLUCOSE: 76 mg/dL (ref 70–99)
POTASSIUM: 4.5 mmol/L (ref 3.5–5.1)
Sodium: 140 mmol/L (ref 135–145)

## 2014-04-02 LAB — C-REACTIVE PROTEIN: CRP: 1.2 mg/dL — AB (ref <0.60)

## 2014-04-02 LAB — SEDIMENTATION RATE: Sed Rate: 39 mm/hr — ABNORMAL HIGH (ref 0–16)

## 2014-04-08 LAB — BASIC METABOLIC PANEL
ANION GAP: 6 (ref 5–15)
BUN: 48 mg/dL — ABNORMAL HIGH (ref 6–23)
CALCIUM: 9.4 mg/dL (ref 8.4–10.5)
CO2: 17 mmol/L — AB (ref 19–32)
Chloride: 112 mmol/L (ref 96–112)
Creatinine, Ser: 1.47 mg/dL — ABNORMAL HIGH (ref 0.50–1.35)
GFR calc non Af Amer: 48 mL/min — ABNORMAL LOW (ref >90)
GFR, EST AFRICAN AMERICAN: 55 mL/min — AB (ref >90)
Glucose, Bld: 64 mg/dL — ABNORMAL LOW (ref 70–99)
Potassium: 5.5 mmol/L — ABNORMAL HIGH (ref 3.5–5.1)
SODIUM: 135 mmol/L (ref 135–145)

## 2014-04-08 LAB — CBC WITH DIFFERENTIAL/PLATELET
Basophils Absolute: 0 10*3/uL (ref 0.0–0.1)
Basophils Relative: 0 % (ref 0–1)
Eosinophils Absolute: 0.2 10*3/uL (ref 0.0–0.7)
Eosinophils Relative: 2 % (ref 0–5)
HEMATOCRIT: 38.3 % — AB (ref 39.0–52.0)
Hemoglobin: 12 g/dL — ABNORMAL LOW (ref 13.0–17.0)
LYMPHS PCT: 17 % (ref 12–46)
Lymphs Abs: 2.1 10*3/uL (ref 0.7–4.0)
MCH: 29.1 pg (ref 26.0–34.0)
MCHC: 31.3 g/dL (ref 30.0–36.0)
MCV: 92.7 fL (ref 78.0–100.0)
MONO ABS: 1.9 10*3/uL — AB (ref 0.1–1.0)
MONOS PCT: 15 % — AB (ref 3–12)
Neutro Abs: 8.4 10*3/uL — ABNORMAL HIGH (ref 1.7–7.7)
Neutrophils Relative %: 66 % (ref 43–77)
Platelets: 372 10*3/uL (ref 150–400)
RBC: 4.13 MIL/uL — AB (ref 4.22–5.81)
RDW: 20.7 % — ABNORMAL HIGH (ref 11.5–15.5)
WBC: 12.6 10*3/uL — ABNORMAL HIGH (ref 4.0–10.5)

## 2014-04-08 LAB — POTASSIUM: Potassium: 5.8 mmol/L — ABNORMAL HIGH (ref 3.5–5.1)

## 2014-04-09 LAB — POTASSIUM: POTASSIUM: 4.8 mmol/L (ref 3.5–5.1)

## 2014-04-09 LAB — C-REACTIVE PROTEIN: CRP: 5.2 mg/dL — AB (ref <0.60)

## 2014-04-09 LAB — SEDIMENTATION RATE: SED RATE: 38 mm/h — AB (ref 0–16)

## 2014-04-10 LAB — PHOSPHORUS: Phosphorus: 4.6 mg/dL (ref 2.3–4.6)

## 2014-04-10 LAB — CBC
HCT: 35.3 % — ABNORMAL LOW (ref 39.0–52.0)
HEMOGLOBIN: 10.9 g/dL — AB (ref 13.0–17.0)
MCH: 28.1 pg (ref 26.0–34.0)
MCHC: 30.9 g/dL (ref 30.0–36.0)
MCV: 91 fL (ref 78.0–100.0)
PLATELETS: 456 10*3/uL — AB (ref 150–400)
RBC: 3.88 MIL/uL — ABNORMAL LOW (ref 4.22–5.81)
RDW: 19.9 % — ABNORMAL HIGH (ref 11.5–15.5)
WBC: 11.7 10*3/uL — ABNORMAL HIGH (ref 4.0–10.5)

## 2014-04-10 LAB — BASIC METABOLIC PANEL
Anion gap: 8 (ref 5–15)
BUN: 48 mg/dL — ABNORMAL HIGH (ref 6–23)
CHLORIDE: 109 mmol/L (ref 96–112)
CO2: 18 mmol/L — AB (ref 19–32)
Calcium: 9.5 mg/dL (ref 8.4–10.5)
Creatinine, Ser: 1.28 mg/dL (ref 0.50–1.35)
GFR calc Af Amer: 65 mL/min — ABNORMAL LOW (ref >90)
GFR calc non Af Amer: 56 mL/min — ABNORMAL LOW (ref >90)
Glucose, Bld: 66 mg/dL — ABNORMAL LOW (ref 70–99)
Potassium: 4.2 mmol/L (ref 3.5–5.1)
SODIUM: 135 mmol/L (ref 135–145)

## 2014-04-10 LAB — MAGNESIUM: MAGNESIUM: 1.7 mg/dL (ref 1.5–2.5)

## 2014-04-13 LAB — CBC WITH DIFFERENTIAL/PLATELET
Basophils Absolute: 0.1 10*3/uL (ref 0.0–0.1)
Basophils Relative: 0 % (ref 0–1)
Eosinophils Absolute: 0.4 10*3/uL (ref 0.0–0.7)
Eosinophils Relative: 3 % (ref 0–5)
HCT: 37.6 % — ABNORMAL LOW (ref 39.0–52.0)
HEMOGLOBIN: 11.2 g/dL — AB (ref 13.0–17.0)
LYMPHS ABS: 2.4 10*3/uL (ref 0.7–4.0)
LYMPHS PCT: 17 % (ref 12–46)
MCH: 27.8 pg (ref 26.0–34.0)
MCHC: 29.8 g/dL — ABNORMAL LOW (ref 30.0–36.0)
MCV: 93.3 fL (ref 78.0–100.0)
MONO ABS: 2.4 10*3/uL — AB (ref 0.1–1.0)
MONOS PCT: 17 % — AB (ref 3–12)
Neutro Abs: 8.6 10*3/uL — ABNORMAL HIGH (ref 1.7–7.7)
Neutrophils Relative %: 63 % (ref 43–77)
Platelets: 480 10*3/uL — ABNORMAL HIGH (ref 150–400)
RBC: 4.03 MIL/uL — AB (ref 4.22–5.81)
RDW: 19.6 % — ABNORMAL HIGH (ref 11.5–15.5)
WBC: 13.8 10*3/uL — AB (ref 4.0–10.5)

## 2014-04-13 LAB — BASIC METABOLIC PANEL
Anion gap: 8 (ref 5–15)
BUN: 47 mg/dL — AB (ref 6–23)
CHLORIDE: 110 mmol/L (ref 96–112)
CO2: 17 mmol/L — ABNORMAL LOW (ref 19–32)
Calcium: 9.3 mg/dL (ref 8.4–10.5)
Creatinine, Ser: 1.18 mg/dL (ref 0.50–1.35)
GFR calc non Af Amer: 62 mL/min — ABNORMAL LOW (ref >90)
GFR, EST AFRICAN AMERICAN: 72 mL/min — AB (ref >90)
Glucose, Bld: 69 mg/dL — ABNORMAL LOW (ref 70–99)
POTASSIUM: 4.4 mmol/L (ref 3.5–5.1)
SODIUM: 135 mmol/L (ref 135–145)

## 2014-04-13 LAB — MAGNESIUM: MAGNESIUM: 1.7 mg/dL (ref 1.5–2.5)

## 2014-04-13 LAB — PHOSPHORUS: PHOSPHORUS: 4.5 mg/dL (ref 2.3–4.6)

## 2014-04-15 LAB — CBC WITH DIFFERENTIAL/PLATELET
BASOS ABS: 0.1 10*3/uL (ref 0.0–0.1)
Basophils Relative: 1 % (ref 0–1)
EOS ABS: 0.5 10*3/uL (ref 0.0–0.7)
Eosinophils Relative: 4 % (ref 0–5)
HCT: 39 % (ref 39.0–52.0)
Hemoglobin: 12.4 g/dL — ABNORMAL LOW (ref 13.0–17.0)
LYMPHS PCT: 17 % (ref 12–46)
Lymphs Abs: 2 10*3/uL (ref 0.7–4.0)
MCH: 28.6 pg (ref 26.0–34.0)
MCHC: 31.8 g/dL (ref 30.0–36.0)
MCV: 90.1 fL (ref 78.0–100.0)
MONO ABS: 1.5 10*3/uL — AB (ref 0.1–1.0)
MONOS PCT: 13 % — AB (ref 3–12)
Neutro Abs: 7.8 10*3/uL — ABNORMAL HIGH (ref 1.7–7.7)
Neutrophils Relative %: 65 % (ref 43–77)
Platelets: 504 10*3/uL — ABNORMAL HIGH (ref 150–400)
RBC: 4.33 MIL/uL (ref 4.22–5.81)
RDW: 19.7 % — ABNORMAL HIGH (ref 11.5–15.5)
WBC: 11.9 10*3/uL — ABNORMAL HIGH (ref 4.0–10.5)

## 2014-04-15 LAB — BASIC METABOLIC PANEL
Anion gap: 5 (ref 5–15)
BUN: 48 mg/dL — AB (ref 6–23)
CALCIUM: 9.2 mg/dL (ref 8.4–10.5)
CHLORIDE: 112 mmol/L (ref 96–112)
CO2: 19 mmol/L (ref 19–32)
Creatinine, Ser: 1.3 mg/dL (ref 0.50–1.35)
GFR, EST AFRICAN AMERICAN: 64 mL/min — AB (ref >90)
GFR, EST NON AFRICAN AMERICAN: 55 mL/min — AB (ref >90)
Glucose, Bld: 117 mg/dL — ABNORMAL HIGH (ref 70–99)
Potassium: 5.8 mmol/L — ABNORMAL HIGH (ref 3.5–5.1)
Sodium: 136 mmol/L (ref 135–145)

## 2014-04-15 LAB — POTASSIUM: POTASSIUM: 5.4 mmol/L — AB (ref 3.5–5.1)

## 2014-04-16 LAB — BASIC METABOLIC PANEL
Anion gap: 6 (ref 5–15)
BUN: 39 mg/dL — ABNORMAL HIGH (ref 6–23)
CALCIUM: 9.5 mg/dL (ref 8.4–10.5)
CHLORIDE: 112 mmol/L (ref 96–112)
CO2: 19 mmol/L (ref 19–32)
Creatinine, Ser: 1.3 mg/dL (ref 0.50–1.35)
GFR calc Af Amer: 64 mL/min — ABNORMAL LOW (ref >90)
GFR calc non Af Amer: 55 mL/min — ABNORMAL LOW (ref >90)
Glucose, Bld: 122 mg/dL — ABNORMAL HIGH (ref 70–99)
POTASSIUM: 4.6 mmol/L (ref 3.5–5.1)
SODIUM: 137 mmol/L (ref 135–145)

## 2014-04-16 LAB — MAGNESIUM: MAGNESIUM: 1.7 mg/dL (ref 1.5–2.5)

## 2014-04-16 LAB — PHOSPHORUS: Phosphorus: 4.7 mg/dL — ABNORMAL HIGH (ref 2.3–4.6)

## 2014-04-19 LAB — URINALYSIS, ROUTINE W REFLEX MICROSCOPIC
BILIRUBIN URINE: NEGATIVE
GLUCOSE, UA: NEGATIVE mg/dL
HGB URINE DIPSTICK: NEGATIVE
Ketones, ur: NEGATIVE mg/dL
Leukocytes, UA: NEGATIVE
NITRITE: NEGATIVE
PROTEIN: NEGATIVE mg/dL
SPECIFIC GRAVITY, URINE: 1.012 (ref 1.005–1.030)
Urobilinogen, UA: 0.2 mg/dL (ref 0.0–1.0)
pH: 5 (ref 5.0–8.0)

## 2014-04-19 LAB — CBC
HEMATOCRIT: 42 % (ref 39.0–52.0)
Hemoglobin: 12.7 g/dL — ABNORMAL LOW (ref 13.0–17.0)
MCH: 28.2 pg (ref 26.0–34.0)
MCHC: 30.2 g/dL (ref 30.0–36.0)
MCV: 93.1 fL (ref 78.0–100.0)
Platelets: 557 10*3/uL — ABNORMAL HIGH (ref 150–400)
RBC: 4.51 MIL/uL (ref 4.22–5.81)
RDW: 19.7 % — AB (ref 11.5–15.5)
WBC: 11.9 10*3/uL — ABNORMAL HIGH (ref 4.0–10.5)

## 2014-04-19 LAB — PROTIME-INR
INR: 1.19 (ref 0.00–1.49)
Prothrombin Time: 15.3 seconds — ABNORMAL HIGH (ref 11.6–15.2)

## 2014-04-19 LAB — RENAL FUNCTION PANEL
ALBUMIN: 2.5 g/dL — AB (ref 3.5–5.2)
Anion gap: 7 (ref 5–15)
BUN: 40 mg/dL — ABNORMAL HIGH (ref 6–23)
CALCIUM: 9.3 mg/dL (ref 8.4–10.5)
CO2: 17 mmol/L — AB (ref 19–32)
Chloride: 108 mmol/L (ref 96–112)
Creatinine, Ser: 1.34 mg/dL (ref 0.50–1.35)
GFR calc Af Amer: 62 mL/min — ABNORMAL LOW (ref >90)
GFR calc non Af Amer: 53 mL/min — ABNORMAL LOW (ref >90)
Glucose, Bld: 93 mg/dL (ref 70–99)
POTASSIUM: 5 mmol/L (ref 3.5–5.1)
Phosphorus: 4.9 mg/dL — ABNORMAL HIGH (ref 2.3–4.6)
SODIUM: 132 mmol/L — AB (ref 135–145)

## 2014-04-19 LAB — C-REACTIVE PROTEIN: CRP: 1.1 mg/dL — ABNORMAL HIGH (ref <0.60)

## 2014-04-20 LAB — RENAL FUNCTION PANEL
Albumin: 2.5 g/dL — ABNORMAL LOW (ref 3.5–5.2)
Anion gap: 5 (ref 5–15)
BUN: 39 mg/dL — AB (ref 6–23)
CO2: 20 mmol/L (ref 19–32)
Calcium: 9.4 mg/dL (ref 8.4–10.5)
Chloride: 109 mmol/L (ref 96–112)
Creatinine, Ser: 1.23 mg/dL (ref 0.50–1.35)
GFR calc Af Amer: 68 mL/min — ABNORMAL LOW (ref >90)
GFR calc non Af Amer: 59 mL/min — ABNORMAL LOW (ref >90)
GLUCOSE: 140 mg/dL — AB (ref 70–99)
PHOSPHORUS: 4.8 mg/dL — AB (ref 2.3–4.6)
Potassium: 4.9 mmol/L (ref 3.5–5.1)
Sodium: 134 mmol/L — ABNORMAL LOW (ref 135–145)

## 2014-04-20 LAB — C-REACTIVE PROTEIN: CRP: 1 mg/dL — AB (ref <0.60)

## 2014-04-20 LAB — URINE CULTURE: Colony Count: 7000

## 2014-04-20 LAB — SEDIMENTATION RATE: SED RATE: 25 mm/h — AB (ref 0–16)

## 2014-04-20 LAB — PSA: PSA: 0.32 ng/mL (ref <=4.00)

## 2014-04-24 LAB — RENAL FUNCTION PANEL
ALBUMIN: 2.4 g/dL — AB (ref 3.5–5.2)
ANION GAP: 8 (ref 5–15)
BUN: 38 mg/dL — ABNORMAL HIGH (ref 6–23)
CHLORIDE: 109 mmol/L (ref 96–112)
CO2: 18 mmol/L — ABNORMAL LOW (ref 19–32)
Calcium: 9.8 mg/dL (ref 8.4–10.5)
Creatinine, Ser: 1.34 mg/dL (ref 0.50–1.35)
GFR calc non Af Amer: 53 mL/min — ABNORMAL LOW (ref >90)
GFR, EST AFRICAN AMERICAN: 62 mL/min — AB (ref >90)
GLUCOSE: 130 mg/dL — AB (ref 70–99)
POTASSIUM: 4.8 mmol/L (ref 3.5–5.1)
Phosphorus: 4.9 mg/dL — ABNORMAL HIGH (ref 2.3–4.6)
SODIUM: 135 mmol/L (ref 135–145)

## 2014-04-24 LAB — CBC WITH DIFFERENTIAL/PLATELET
BASOS ABS: 0.1 10*3/uL (ref 0.0–0.1)
Basophils Relative: 0 % (ref 0–1)
EOS ABS: 0.7 10*3/uL (ref 0.0–0.7)
Eosinophils Relative: 6 % — ABNORMAL HIGH (ref 0–5)
HEMATOCRIT: 42.8 % (ref 39.0–52.0)
HEMOGLOBIN: 12.9 g/dL — AB (ref 13.0–17.0)
LYMPHS ABS: 2.1 10*3/uL (ref 0.7–4.0)
Lymphocytes Relative: 18 % (ref 12–46)
MCH: 27.6 pg (ref 26.0–34.0)
MCHC: 30.1 g/dL (ref 30.0–36.0)
MCV: 91.6 fL (ref 78.0–100.0)
Monocytes Absolute: 2 10*3/uL — ABNORMAL HIGH (ref 0.1–1.0)
Monocytes Relative: 17 % — ABNORMAL HIGH (ref 3–12)
NEUTROS PCT: 59 % (ref 43–77)
Neutro Abs: 6.8 10*3/uL (ref 1.7–7.7)
PLATELETS: 611 10*3/uL — AB (ref 150–400)
RBC: 4.67 MIL/uL (ref 4.22–5.81)
RDW: 19.9 % — ABNORMAL HIGH (ref 11.5–15.5)
WBC: 11.5 10*3/uL — ABNORMAL HIGH (ref 4.0–10.5)

## 2014-04-24 LAB — MAGNESIUM: MAGNESIUM: 1.7 mg/dL (ref 1.5–2.5)

## 2014-04-27 LAB — CBC
HEMATOCRIT: 41.1 % (ref 39.0–52.0)
HEMOGLOBIN: 12.3 g/dL — AB (ref 13.0–17.0)
MCH: 27.2 pg (ref 26.0–34.0)
MCHC: 29.9 g/dL — AB (ref 30.0–36.0)
MCV: 90.7 fL (ref 78.0–100.0)
Platelets: 620 10*3/uL — ABNORMAL HIGH (ref 150–400)
RBC: 4.53 MIL/uL (ref 4.22–5.81)
RDW: 19.5 % — ABNORMAL HIGH (ref 11.5–15.5)
WBC: 12.7 10*3/uL — ABNORMAL HIGH (ref 4.0–10.5)

## 2014-04-27 LAB — SEDIMENTATION RATE: SED RATE: 48 mm/h — AB (ref 0–16)

## 2014-04-27 LAB — C-REACTIVE PROTEIN: CRP: 1.4 mg/dL — ABNORMAL HIGH (ref <0.60)

## 2014-04-27 LAB — RENAL FUNCTION PANEL
ANION GAP: 5 (ref 5–15)
Albumin: 2.4 g/dL — ABNORMAL LOW (ref 3.5–5.2)
BUN: 48 mg/dL — ABNORMAL HIGH (ref 6–23)
CHLORIDE: 109 mmol/L (ref 96–112)
CO2: 23 mmol/L (ref 19–32)
Calcium: 9.6 mg/dL (ref 8.4–10.5)
Creatinine, Ser: 1.74 mg/dL — ABNORMAL HIGH (ref 0.50–1.35)
GFR calc Af Amer: 45 mL/min — ABNORMAL LOW (ref >90)
GFR, EST NON AFRICAN AMERICAN: 39 mL/min — AB (ref >90)
Glucose, Bld: 184 mg/dL — ABNORMAL HIGH (ref 70–99)
Phosphorus: 5.7 mg/dL — ABNORMAL HIGH (ref 2.3–4.6)
Potassium: 5.4 mmol/L — ABNORMAL HIGH (ref 3.5–5.1)
Sodium: 137 mmol/L (ref 135–145)

## 2014-04-27 LAB — MAGNESIUM: Magnesium: 1.9 mg/dL (ref 1.5–2.5)

## 2014-04-28 LAB — RENAL FUNCTION PANEL
ALBUMIN: 2.5 g/dL — AB (ref 3.5–5.2)
ANION GAP: 9 (ref 5–15)
BUN: 43 mg/dL — ABNORMAL HIGH (ref 6–23)
CHLORIDE: 105 mmol/L (ref 96–112)
CO2: 20 mmol/L (ref 19–32)
Calcium: 9.6 mg/dL (ref 8.4–10.5)
Creatinine, Ser: 1.28 mg/dL (ref 0.50–1.35)
GFR calc Af Amer: 65 mL/min — ABNORMAL LOW (ref >90)
GFR calc non Af Amer: 56 mL/min — ABNORMAL LOW (ref >90)
GLUCOSE: 160 mg/dL — AB (ref 70–99)
PHOSPHORUS: 4.8 mg/dL — AB (ref 2.3–4.6)
POTASSIUM: 5.1 mmol/L (ref 3.5–5.1)
Sodium: 134 mmol/L — ABNORMAL LOW (ref 135–145)

## 2014-04-28 LAB — MAGNESIUM: Magnesium: 1.8 mg/dL (ref 1.5–2.5)

## 2014-07-13 ENCOUNTER — Encounter: Payer: Self-pay | Admitting: Internal Medicine

## 2014-07-13 ENCOUNTER — Non-Acute Institutional Stay (SKILLED_NURSING_FACILITY): Payer: Medicare Other | Admitting: Internal Medicine

## 2014-07-13 DIAGNOSIS — Z8619 Personal history of other infectious and parasitic diseases: Secondary | ICD-10-CM | POA: Diagnosis not present

## 2014-07-13 DIAGNOSIS — K810 Acute cholecystitis: Secondary | ICD-10-CM

## 2014-07-13 DIAGNOSIS — Z9889 Other specified postprocedural states: Secondary | ICD-10-CM | POA: Diagnosis not present

## 2014-07-13 DIAGNOSIS — A419 Sepsis, unspecified organism: Secondary | ICD-10-CM | POA: Diagnosis not present

## 2014-07-13 DIAGNOSIS — M4628 Osteomyelitis of vertebra, sacral and sacrococcygeal region: Secondary | ICD-10-CM | POA: Diagnosis not present

## 2014-07-13 DIAGNOSIS — S81802D Unspecified open wound, left lower leg, subsequent encounter: Secondary | ICD-10-CM | POA: Diagnosis not present

## 2014-07-13 DIAGNOSIS — D638 Anemia in other chronic diseases classified elsewhere: Secondary | ICD-10-CM | POA: Diagnosis not present

## 2014-07-13 DIAGNOSIS — G894 Chronic pain syndrome: Secondary | ICD-10-CM

## 2014-07-13 DIAGNOSIS — E118 Type 2 diabetes mellitus with unspecified complications: Secondary | ICD-10-CM

## 2014-07-13 DIAGNOSIS — Z9049 Acquired absence of other specified parts of digestive tract: Secondary | ICD-10-CM | POA: Insufficient documentation

## 2014-07-13 DIAGNOSIS — E038 Other specified hypothyroidism: Secondary | ICD-10-CM

## 2014-07-13 DIAGNOSIS — S81802A Unspecified open wound, left lower leg, initial encounter: Secondary | ICD-10-CM | POA: Insufficient documentation

## 2014-07-13 DIAGNOSIS — Z933 Colostomy status: Secondary | ICD-10-CM

## 2014-07-13 DIAGNOSIS — N183 Chronic kidney disease, stage 3 unspecified: Secondary | ICD-10-CM | POA: Insufficient documentation

## 2014-07-13 DIAGNOSIS — L89154 Pressure ulcer of sacral region, stage 4: Secondary | ICD-10-CM

## 2014-07-13 DIAGNOSIS — E039 Hypothyroidism, unspecified: Secondary | ICD-10-CM | POA: Insufficient documentation

## 2014-07-13 DIAGNOSIS — R5381 Other malaise: Secondary | ICD-10-CM

## 2014-07-13 DIAGNOSIS — E034 Atrophy of thyroid (acquired): Secondary | ICD-10-CM

## 2014-07-13 DIAGNOSIS — R6521 Severe sepsis with septic shock: Secondary | ICD-10-CM

## 2014-07-13 DIAGNOSIS — N401 Enlarged prostate with lower urinary tract symptoms: Secondary | ICD-10-CM | POA: Diagnosis not present

## 2014-07-13 DIAGNOSIS — N138 Other obstructive and reflux uropathy: Secondary | ICD-10-CM | POA: Insufficient documentation

## 2014-07-13 NOTE — Assessment & Plan Note (Signed)
2/2 acute cholecystitis, long hospital course, long time abx, tx at [facility] then [facility]

## 2014-07-13 NOTE — Assessment & Plan Note (Signed)
Continue flomax and doxazosin

## 2014-07-13 NOTE — Assessment & Plan Note (Signed)
Hopsitalized for 4 months, admitte to SNF for OT/PT

## 2014-07-13 NOTE — Assessment & Plan Note (Signed)
unstageable but shallow lesions improving

## 2014-07-13 NOTE — Assessment & Plan Note (Signed)
Baseline after ARF

## 2014-07-13 NOTE — Assessment & Plan Note (Signed)
Cont synthroid 100 mcg

## 2014-07-13 NOTE — Assessment & Plan Note (Signed)
Reported controlled with lantus and novolog SSI

## 2014-07-13 NOTE — Progress Notes (Signed)
MRN: [id number] Name: Gabriel Lynch  Sex: male Age: 69 y.o. DOB: [birth year]  [facility] Facility/Room:309 Level Of Care: SNF Provider: Inocencio Homes D Emergency Contacts: Extended Emergency Contact Information Primary Emergency Contact: Cleotis Lema of [place] Phone: [phone] Relation: Son Secondary Emergency Contact: Rudene Anda [place] of [place] Phone: [phone] Relation: Sister  Code FULL:   Allergies: Influenza vaccines  Chief Complaint  Patient presents with  . New Admit To SNF    HPI: Patient is 69 y.o. male who is admitted to SNF with severe debility from a host of medical problems listed below for which he was hospitalized for 4 months, first at [facility], then [facility].  Past Medical History  Diagnosis Date  . Ulcerative colitis   . Diabetes mellitus without complication   . Hypertension   . Iron deficiency anemia   . Hypothyroidism   . 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 Back surgery      multiple surgeries per family  . 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 Appendectomy    . Debridement of sacral decubitus ulcer  01-26-14      Medication List       This list is accurate as of: 07/13/14  9:38 PM.  Always use your most recent med list.               amLODipine 10 MG tablet  Commonly known as:  NORVASC  Take 10 mg by mouth daily.     aspirin 81 MG tablet  Take 81 mg by mouth daily.     calcium acetate 667 MG capsule  Commonly known as:  PHOSLO  Take 667 mg by mouth daily.     carvedilol 12.5 MG tablet  Commonly known as:  COREG  Take 12.5 mg by mouth 2 (two) times daily with a meal.     doxazosin 1 MG tablet  Commonly known as:  CARDURA  Take 1 mg by mouth daily.     fentaNYL 50 MCG/HR  Commonly known as:  DURAGESIC - dosed mcg/hr  Place 50 mcg onto the skin every 3 (three) days.     ferrous  sulfate 325 (65 FE) MG tablet  Take 325 mg by mouth daily with breakfast.     HYDROmorphone 2 MG tablet  Commonly known as:  DILAUDID  Take 2 mg by mouth every 6 (six) hours as needed for severe pain.     insulin aspart 100 UNIT/ML injection  Commonly known as:  novoLOG  Inject 0-15 Units into the skin every 4 (four) hours.     insulin glargine 100 UNIT/ML injection  Commonly known as:  LANTUS  Inject 20 Units into the skin at bedtime.     levothyroxine 100 MCG tablet  Commonly known as:  SYNTHROID, LEVOTHROID  Take 100 mcg by mouth daily before breakfast.     mirtazapine 15 MG tablet  Commonly known as:  REMERON  Take 15 mg by mouth at bedtime.     multivitamin with minerals tablet  Take 1 tablet by mouth daily.     omeprazole 20 MG capsule  Commonly known as:  PRILOSEC  Take 20 mg by mouth daily.     ondansetron 4 MG tablet  Commonly known as:  ZOFRAN  Take 4 mg by mouth every 4 (four) hours as needed for nausea or vomiting.  saccharomyces boulardii 250 MG capsule  Commonly known as:  FLORASTOR  Take 250 mg by mouth 2 (two) times daily.     sodium polystyrene 15 GM/60ML suspension  Commonly known as:  KAYEXALATE  Take 15 g by mouth every morning.     tamsulosin 0.4 MG Caps capsule  Commonly known as:  FLOMAX  Take 0.4 mg by mouth daily after breakfast.        Meds ordered this encounter  Medications  . amLODipine (NORVASC) 10 MG tablet    Sig: Take 10 mg by mouth daily.  Marland Kitchen aspirin 81 MG tablet    Sig: Take 81 mg by mouth daily.  . carvedilol (COREG) 12.5 MG tablet    Sig: Take 12.5 mg by mouth 2 (two) times daily with a meal.  . HYDROmorphone (DILAUDID) 2 MG tablet    Sig: Take 2 mg by mouth every 6 (six) hours as needed for severe pain.  Marland Kitchen doxazosin (CARDURA) 1 MG tablet    Sig: Take 1 mg by mouth daily.  . fentaNYL (DURAGESIC - DOSED MCG/HR) 50 MCG/HR    Sig: Place 50 mcg onto the skin every 3 (three) days.  . ferrous sulfate 325 (65 FE) MG tablet     Sig: Take 325 mg by mouth daily with breakfast.  . sodium polystyrene (KAYEXALATE) 15 GM/60ML suspension    Sig: Take 15 g by mouth every morning.  . insulin glargine (LANTUS) 100 UNIT/ML injection    Sig: Inject 20 Units into the skin at bedtime.  . Multiple Vitamins-Minerals (MULTIVITAMIN WITH MINERALS) tablet    Sig: Take 1 tablet by mouth daily.  Marland Kitchen omeprazole (PRILOSEC) 20 MG capsule    Sig: Take 20 mg by mouth daily.  . ondansetron (ZOFRAN) 4 MG tablet    Sig: Take 4 mg by mouth every 4 (four) hours as needed for nausea or vomiting.  . calcium acetate (PHOSLO) 667 MG capsule    Sig: Take 667 mg by mouth daily.  Marland Kitchen saccharomyces boulardii (FLORASTOR) 250 MG capsule    Sig: Take 250 mg by mouth 2 (two) times daily.  . mirtazapine (REMERON) 15 MG tablet    Sig: Take 15 mg by mouth at bedtime.     There is no immunization history on file for this patient.  History  Substance Use Topics  . Smoking status: Unknown If Ever Smoked  . Smokeless tobacco: Not on file  . Alcohol Use: No    Family history is noncontributory    Review of Systems  DATA OBTAINED: from patient, nurse GENERAL:  no fevers, fatigue, appetite changes SKIN: No itching, rash; c/o no medihomey for wound and no special bed; he had voiced these same concerns to [place] who was already working on it EYES: No eye pain, redness, discharge EARS: No earache, tinnitus, change in hearing NOSE: No congestion, drainage or bleeding  MOUTH/THROAT: No mouth or tooth pain, No sore throat RESPIRATORY: No cough, wheezing, SOB CARDIAC: No chest pain, palpitations, lower extremity edema  GI: No abdominal pain, No N/V/D or constipation, No heartburn or reflux  GU: No dysuria, frequency or urgency, or incontinence  MUSCULOSKELETAL: No unrelieved bone/joint pain NEUROLOGIC: No headache, dizziness or focal weakness PSYCHIATRIC: No overt anxiety or sadness, No behavior issue.   Filed Vitals:   07/13/14 1049  BP: 128/86   Pulse: 84  Temp: 97 F (36.1 C)  Resp: 19    Physical Exam  GENERAL APPEARANCE: Alert, conversant,  BM No acute distress.  SKIN: No diaphoresis rash HEAD: Normocephalic, atraumatic  EYES: Conjunctiva/lids clear. Pupils round, reactive. EOMs intact.  EARS: External exam WNL, canals clear. Hearing grossly normal.  NOSE: No deformity or discharge.  MOUTH/THROAT: Lips w/o lesions  RESPIRATORY: Breathing is even, unlabored. Lung sounds are clear   CARDIOVASCULAR: Heart RRR no murmurs, rubs or gallops. No peripheral edema.   GASTROINTESTINAL: Abdomen is soft, non-tender, not distended w/ normal bowel sounds.;Colostomy GENITOURINARY: Bladder non tender, not distended  MUSCULOSKELETAL: No abnormal joints or musculature NEUROLOGIC:  Cranial nerves 2-12 grossly intact. Moves all extremities  PSYCHIATRIC: Mood and affect appropriate to situation, no behavioral issues  Patient Active Problem List   Diagnosis Date Noted  . S/P colectomy 07/13/2014  . Severe sepsis with septic shock 07/13/2014  . Osteomyelitis of sacrum 07/13/2014  . Wound of left lower extremity 07/13/2014  . CKD (chronic kidney disease) stage 3, GFR 30-59 ml/min 07/13/2014  . DM type 2, controlled, with complication 14/70/9295  . Anemia, chronic disease 07/13/2014  . BPH (benign prostatic hypertrophy) with urinary obstruction 07/13/2014  . Hypothyroidism 07/13/2014  . H/O Clostridium difficile infection 07/13/2014  . Chronic pain syndrome 07/13/2014  . Debility 07/13/2014  . Central line complication   . Septic shock 03/06/2014  . Stage IV pressure ulcer of sacral region 03/06/2014  . Acute cholecystitis   . Acidosis   . AKI (acute kidney injury)     CBC    Component Value Date/Time   WBC 12.7* 04/27/2014 0545   RBC 4.53 04/27/2014 0545   HGB 12.3* 04/27/2014 0545   HCT 41.1 04/27/2014 0545   PLT 620* 04/27/2014 0545   MCV 90.7 04/27/2014 0545   LYMPHSABS 2.1 04/24/2014 0539   MONOABS 2.0* 04/24/2014 0539    EOSABS 0.7 04/24/2014 0539   BASOSABS 0.1 04/24/2014 0539    CMP     Component Value Date/Time   NA 134* 04/28/2014 0700   K 5.1 04/28/2014 0700   CL 105 04/28/2014 0700   CO2 20 04/28/2014 0700   GLUCOSE 160* 04/28/2014 0700   BUN 43* 04/28/2014 0700   CREATININE 1.28 04/28/2014 0700   CALCIUM 9.6 04/28/2014 0700   CALCIUM 8.5* 03/18/2014 0700   PROT 5.2* 03/11/2014 0700   ALBUMIN 2.5* 04/28/2014 0700   AST 19 03/11/2014 0700   ALT 16 03/11/2014 0700   ALKPHOS 156* 03/11/2014 0700   BILITOT 0.7 03/11/2014 0700   GFRNONAA 56* 04/28/2014 0700   GFRAA 65* 04/28/2014 0700    Assessment and Plan  Acute cholecystitis S/p cholecystotomy tube placement 02/2014 with development of sepric shock and ARF  Severe sepsis with septic shock 2/2 acute cholecystitis, long hospital course, long time abx, tx at [facility] then transfer to [facility]  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;multiple abx courses and followed now with ESR and CRP  Stage IV pressure ulcer of sacral region Quite large but improved while at [facility]  Wound of left lower extremity unstageable but shallow lesions improving  CKD (chronic kidney disease) stage 3, GFR 30-59 ml/min Baseline after ARF  DM type 2, controlled, with complication Reported controlled with lantus and novolog SSI  Anemia, chronic disease Reported stable  BPH (benign prostatic hypertrophy) with urinary obstruction Continue flomax and doxazosin   Hypothyroidism Cont synthroid 100 mcg  H/O Clostridium difficile infection This hospitalization, treated  S/P colectomy 2/2 UC 01/2014  Debility Hopsitalized for 4 months, admitte to SNF for OT/PT    Hennie Duos, MD

## 2014-07-13 NOTE — Assessment & Plan Note (Signed)
Quite large but improved while at [facility]

## 2014-07-13 NOTE — Assessment & Plan Note (Signed)
This hospitalization, treated

## 2014-07-13 NOTE — Assessment & Plan Note (Signed)
noted 

## 2014-07-13 NOTE — Assessment & Plan Note (Signed)
Reported stable

## 2014-07-13 NOTE — Assessment & Plan Note (Signed)
2/2 stage 4 pressure ulcer;multiple abx courses and followed now with ESR and CRP

## 2014-07-13 NOTE — Assessment & Plan Note (Signed)
S/p cholecystotomy tube placement 02/2014 with development of sepric shock and ARF

## 2014-07-13 NOTE — Assessment & Plan Note (Signed)
2/2 UC 01/2014

## 2014-07-18 ENCOUNTER — Encounter (HOSPITAL_COMMUNITY): Payer: Self-pay | Admitting: Emergency Medicine

## 2014-07-18 ENCOUNTER — Inpatient Hospital Stay (HOSPITAL_COMMUNITY)
Admission: EM | Admit: 2014-07-18 | Discharge: 2014-07-24 | DRG: 682 | Disposition: A | Discharge status: Skilled Nursing Facility | Payer: Medicare Other | Attending: Internal Medicine | Admitting: Internal Medicine

## 2014-07-18 DIAGNOSIS — Z933 Colostomy status: Secondary | ICD-10-CM

## 2014-07-18 DIAGNOSIS — R197 Diarrhea, unspecified: Secondary | ICD-10-CM | POA: Insufficient documentation

## 2014-07-18 DIAGNOSIS — K819 Cholecystitis, unspecified: Secondary | ICD-10-CM

## 2014-07-18 DIAGNOSIS — R627 Adult failure to thrive: Secondary | ICD-10-CM | POA: Diagnosis present

## 2014-07-18 DIAGNOSIS — Z7982 Long term (current) use of aspirin: Secondary | ICD-10-CM

## 2014-07-18 DIAGNOSIS — H5441 Blindness, right eye, normal vision left eye: Secondary | ICD-10-CM | POA: Diagnosis present

## 2014-07-18 DIAGNOSIS — E860 Dehydration: Secondary | ICD-10-CM | POA: Diagnosis present

## 2014-07-18 DIAGNOSIS — D473 Essential (hemorrhagic) thrombocythemia: Secondary | ICD-10-CM | POA: Diagnosis present

## 2014-07-18 DIAGNOSIS — R339 Retention of urine, unspecified: Secondary | ICD-10-CM | POA: Diagnosis present

## 2014-07-18 DIAGNOSIS — L89154 Pressure ulcer of sacral region, stage 4: Secondary | ICD-10-CM | POA: Diagnosis present

## 2014-07-18 DIAGNOSIS — D72829 Elevated white blood cell count, unspecified: Secondary | ICD-10-CM | POA: Diagnosis not present

## 2014-07-18 DIAGNOSIS — A0472 Enterocolitis due to Clostridium difficile, not specified as recurrent: Secondary | ICD-10-CM | POA: Diagnosis present

## 2014-07-18 DIAGNOSIS — Z794 Long term (current) use of insulin: Secondary | ICD-10-CM

## 2014-07-18 DIAGNOSIS — N179 Acute kidney failure, unspecified: Secondary | ICD-10-CM | POA: Diagnosis not present

## 2014-07-18 DIAGNOSIS — N183 Chronic kidney disease, stage 3 unspecified: Secondary | ICD-10-CM | POA: Diagnosis present

## 2014-07-18 DIAGNOSIS — M4628 Osteomyelitis of vertebra, sacral and sacrococcygeal region: Secondary | ICD-10-CM | POA: Diagnosis present

## 2014-07-18 DIAGNOSIS — E118 Type 2 diabetes mellitus with unspecified complications: Secondary | ICD-10-CM | POA: Diagnosis present

## 2014-07-18 DIAGNOSIS — Z79891 Long term (current) use of opiate analgesic: Secondary | ICD-10-CM

## 2014-07-18 DIAGNOSIS — E1165 Type 2 diabetes mellitus with hyperglycemia: Secondary | ICD-10-CM | POA: Diagnosis present

## 2014-07-18 DIAGNOSIS — Z681 Body mass index (BMI) 19 or less, adult: Secondary | ICD-10-CM

## 2014-07-18 DIAGNOSIS — S81802A Unspecified open wound, left lower leg, initial encounter: Secondary | ICD-10-CM | POA: Diagnosis present

## 2014-07-18 DIAGNOSIS — G894 Chronic pain syndrome: Secondary | ICD-10-CM | POA: Diagnosis present

## 2014-07-18 DIAGNOSIS — I129 Hypertensive chronic kidney disease with stage 1 through stage 4 chronic kidney disease, or unspecified chronic kidney disease: Secondary | ICD-10-CM | POA: Diagnosis present

## 2014-07-18 DIAGNOSIS — E46 Unspecified protein-calorie malnutrition: Secondary | ICD-10-CM | POA: Diagnosis present

## 2014-07-18 DIAGNOSIS — E039 Hypothyroidism, unspecified: Secondary | ICD-10-CM | POA: Diagnosis present

## 2014-07-18 DIAGNOSIS — Z887 Allergy status to serum and vaccine status: Secondary | ICD-10-CM

## 2014-07-18 DIAGNOSIS — E876 Hypokalemia: Secondary | ICD-10-CM | POA: Diagnosis not present

## 2014-07-18 DIAGNOSIS — D638 Anemia in other chronic diseases classified elsewhere: Secondary | ICD-10-CM | POA: Diagnosis present

## 2014-07-18 DIAGNOSIS — Z9049 Acquired absence of other specified parts of digestive tract: Secondary | ICD-10-CM | POA: Diagnosis present

## 2014-07-18 LAB — BASIC METABOLIC PANEL
Anion gap: 10 (ref 5–15)
BUN: 49 mg/dL — ABNORMAL HIGH (ref 6–20)
CO2: 20 mmol/L — ABNORMAL LOW (ref 22–32)
Calcium: 11.3 mg/dL — ABNORMAL HIGH (ref 8.9–10.3)
Chloride: 105 mmol/L (ref 101–111)
Creatinine, Ser: 2.85 mg/dL — ABNORMAL HIGH (ref 0.61–1.24)
GFR calc Af Amer: 25 mL/min — ABNORMAL LOW (ref >60)
GFR calc non Af Amer: 21 mL/min — ABNORMAL LOW (ref >60)
Glucose, Bld: 150 mg/dL — ABNORMAL HIGH (ref 65–99)
Potassium: 4.1 mmol/L (ref 3.5–5.1)
Sodium: 135 mmol/L (ref 135–145)

## 2014-07-18 LAB — CBC WITH DIFFERENTIAL/PLATELET
Basophils Absolute: 0.1 10*3/uL (ref 0.0–0.1)
Basophils Relative: 1 % (ref 0–1)
Eosinophils Absolute: 1 10*3/uL — ABNORMAL HIGH (ref 0.0–0.7)
Eosinophils Relative: 7 % — ABNORMAL HIGH (ref 0–5)
HCT: 31.9 % — ABNORMAL LOW (ref 39.0–52.0)
Hemoglobin: 9.9 g/dL — ABNORMAL LOW (ref 13.0–17.0)
Lymphocytes Relative: 23 % (ref 12–46)
Lymphs Abs: 3.3 10*3/uL (ref 0.7–4.0)
MCH: 29.6 pg (ref 26.0–34.0)
MCHC: 31 g/dL (ref 30.0–36.0)
MCV: 95.5 fL (ref 78.0–100.0)
Monocytes Absolute: 1.5 10*3/uL — ABNORMAL HIGH (ref 0.1–1.0)
Monocytes Relative: 11 % (ref 3–12)
Neutro Abs: 8.7 10*3/uL — ABNORMAL HIGH (ref 1.7–7.7)
Neutrophils Relative %: 60 % (ref 43–77)
Platelets: 723 10*3/uL — ABNORMAL HIGH (ref 150–400)
RBC: 3.34 MIL/uL — ABNORMAL LOW (ref 4.22–5.81)
RDW: 20.4 % — ABNORMAL HIGH (ref 11.5–15.5)
WBC: 14.6 10*3/uL — ABNORMAL HIGH (ref 4.0–10.5)

## 2014-07-18 MED ORDER — SODIUM CHLORIDE 0.9 % IV BOLUS (SEPSIS)
1000.0000 mL | Freq: Once | INTRAVENOUS | Status: AC
  Administered 2014-07-19: 1000 mL via INTRAVENOUS

## 2014-07-18 NOTE — ED Notes (Signed)
Attempted foley catheter insertion, resistance met, PA notified. Will attempt coude catheter. Charge Nurse notified to attempt coude foley.

## 2014-07-18 NOTE — ED Provider Notes (Signed)
CSN: [id number]     Arrival date & time 07/18/14  2030 History   First MD Initiated Contact with Patient 07/18/14 2108     Chief Complaint  Patient presents with  . Urinary Retention  . Diarrhea    HPI   68 year old now presents today with urinary retention and increased watery output from his colostomy bag. Patient has numerous chronic health conditions, was hospitalized in February 2016; discharge diagnosis of septic shock, acute cholecystitis, stage IV pressure ulcer sacral region, acidosis, acute kidney injury, central line complication.  Since that time patient has had a colostomy bag, urinary catheter, percutaneous cholecystectomy tube. Patient reports that over the last 3 days he's has increased output from his colostomy bag. Patient reports that over the last day he has been unable to urinate. Patient reports urinary catheter was removed proximal one month ago, his Flomax was reduced from twice daily to once daily, patient is uncertain why this was done. Chart review shows patient had issues with urinary retention prior to hospitalization, multiple attempts at voiding without urinary catheter were unsuccessful before finally removing the catheter. Patient reports he recently finished antibiotic therapy for C. difficile colitis. Patient is uncertain to which antibiotic he was prescribed, notes that he finished approximately one week ago. He reports that colostomy bag has been feeling a very rapidly having to be changed every 2 hours. Patient reports mild generalized fatigue today, but denies any other specific complaints other than those noted above. Patient denies fever, chills, nausea, vomiting, abdominal pain or discomfort. She reports he has a sacral ulcer that is being closely managed by wound care, last seen yesterday.    Past Medical History  Diagnosis Date  . Ulcerative colitis   . Diabetes mellitus without complication   . Hypertension   . Iron deficiency anemia   . Hypothyroidism    . 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 Back surgery      multiple surgeries per family  . 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 Appendectomy    . Debridement of sacral decubitus ulcer  01-26-14   No family history on file. History  Substance Use Topics  . Smoking status: Unknown If Ever Smoked  . Smokeless tobacco: Not on file  . Alcohol Use: No    Review of Systems  All other systems reviewed and are negative.   Allergies  Influenza vaccines and Other  Home Medications   Prior to Admission medications   Medication Sig Start Date End Date Taking? Authorizing Provider  Amino Acids-Protein Hydrolys (FEEDING SUPPLEMENT, PRO-STAT SUGAR FREE 64,) LIQD Take 30 mLs by mouth 2 (two) times daily.   Yes Historical Provider, MD  amLODipine (NORVASC) 10 MG tablet Take 10 mg by mouth daily.   Yes Historical Provider, MD  aspirin 81 MG tablet Take 81 mg by mouth daily.   Yes Historical Provider, MD  calcium acetate (PHOSLO) 667 MG capsule Take 667 mg by mouth daily.   Yes Historical Provider, MD  carvedilol (COREG) 12.5 MG tablet Take 12.5 mg by mouth 2 (two) times daily with a meal.   Yes Historical Provider, MD  doxazosin (CARDURA) 1 MG tablet Take 1 mg by mouth daily.   Yes Historical Provider, MD  fentaNYL (DURAGESIC - DOSED MCG/HR) 50 MCG/HR Place 50 mcg onto the skin every 3 (three) days.   Yes Historical Provider, MD  ferrous  sulfate 325 (65 FE) MG tablet Take 325 mg by mouth daily with breakfast.   Yes Historical Provider, MD  HYDROmorphone (DILAUDID) 2 MG tablet Take 2 mg by mouth every 6 (six) hours as needed for severe pain.   Yes Historical Provider, MD  insulin glargine (LANTUS) 100 UNIT/ML injection Inject 20 Units into the skin every morning.    Yes Historical Provider, MD  insulin lispro (HUMALOG) 100 UNIT/ML injection Inject 0-15 Units into the skin 3 (three) times daily before meals.  Sliding scale   Yes Historical Provider, MD  levothyroxine (SYNTHROID, LEVOTHROID) 100 MCG tablet Take 100 mcg by mouth daily before breakfast.   Yes Historical Provider, MD  mirtazapine (REMERON) 15 MG tablet Take 15 mg by mouth at bedtime.   Yes Historical Provider, MD  Multiple Vitamins-Minerals (MULTIVITAMIN WITH MINERALS) tablet Take 1 tablet by mouth daily.   Yes Historical Provider, MD  omeprazole (PRILOSEC) 20 MG capsule Take 20 mg by mouth daily.   Yes Historical Provider, MD  ondansetron (ZOFRAN) 4 MG tablet Take 4 mg by mouth every 4 (four) hours as needed for nausea or vomiting.   Yes Historical Provider, MD  OVER THE COUNTER MEDICATION Take 120 mLs by mouth 2 (two) times daily. Med pass   Yes Historical Provider, MD  Probiotic Product (PROBIOTIC DAILY) CAPS Take 1 capsule by mouth daily.   Yes Historical Provider, MD  sodium polystyrene (KAYEXALATE) 15 GM/60ML suspension Take 15 g by mouth every morning.   Yes Historical Provider, MD  tamsulosin (FLOMAX) 0.4 MG CAPS capsule Take 0.4 mg by mouth every other day.    Yes Historical Provider, MD   BP 127/67 mmHg  Pulse 65  Temp(Src) 98.4 F (36.9 C) (Oral)  Resp 18  Ht 6\' 2"  (1.88 m)  Wt 151 lb 8 oz (68.72 kg)  BMI 19.44 kg/m2  SpO2 100%   Physical Exam  Constitutional: He is oriented to person, place, and time. He appears well-developed and well-nourished.  HENT:  Head: Normocephalic and atraumatic.  Eyes: Pupils are equal, round, and reactive to light.  Neck: Normal range of motion. Neck supple. No JVD present. No tracheal deviation present. No thyromegaly present.  Cardiovascular: Normal rate, regular rhythm, normal heart sounds and intact distal pulses.  Exam reveals no gallop and no friction rub.   No murmur heard. Pulmonary/Chest: Effort normal and breath sounds normal. No stridor. No respiratory distress. He has no wheezes. He has no rales. He exhibits no tenderness.  Abdominal: Soft. He exhibits no distension and no  mass. There is no tenderness. There is no rebound and no guarding.  Ostomy tube in place, no surrounding infection. Copious watery output. Percutaneous cholecystostomy site drain intact  Musculoskeletal: Normal range of motion.  Lymphadenopathy:    He has no cervical adenopathy.  Neurological: He is alert and oriented to person, place, and time. Coordination normal.  Skin: Skin is warm and dry.  Sacral decubitus ulcer covered  Psychiatric: He has a normal mood and affect. His behavior is normal. Judgment and thought content normal.  Nursing note and vitals reviewed.   ED Course  Procedures (including critical care time) Labs Review Labs Reviewed  CBC WITH DIFFERENTIAL/PLATELET - Abnormal; Notable for the following:    WBC 14.6 (*)    RBC 3.34 (*)    Hemoglobin 9.9 (*)    HCT 31.9 (*)    RDW 20.4 (*)    Platelets 723 (*)    Neutro Abs 8.7 (*)  Monocytes Absolute 1.5 (*)    Eosinophils Relative 7 (*)    Eosinophils Absolute 1.0 (*)    All other components within normal limits  BASIC METABOLIC PANEL - Abnormal; Notable for the following:    CO2 20 (*)    Glucose, Bld 150 (*)    BUN 49 (*)    Creatinine, Ser 2.85 (*)    Calcium 11.3 (*)    GFR calc non Af Amer 21 (*)    GFR calc Af Amer 25 (*)    All other components within normal limits  URINALYSIS, ROUTINE W REFLEX MICROSCOPIC (NOT AT [facility]) - Abnormal; Notable for the following:    Leukocytes, UA TRACE (*)    All other components within normal limits  URINE MICROSCOPIC-ADD ON - Abnormal; Notable for the following:    Casts HYALINE CASTS (*)    All other components within normal limits  BASIC METABOLIC PANEL - Abnormal; Notable for the following:    CO2 17 (*)    Glucose, Bld 166 (*)    BUN 52 (*)    Creatinine, Ser 2.86 (*)    Calcium 10.5 (*)    GFR calc non Af Amer 21 (*)    GFR calc Af Amer 24 (*)    All other components within normal limits  CBC - Abnormal; Notable for the following:    WBC 15.2 (*)    RBC  3.34 (*)    Hemoglobin 9.7 (*)    HCT 31.6 (*)    RDW 20.1 (*)    Platelets 597 (*)    All other components within normal limits  GLUCOSE, CAPILLARY - Abnormal; Notable for the following:    Glucose-Capillary 168 (*)    All other components within normal limits  GLUCOSE, CAPILLARY - Abnormal; Notable for the following:    Glucose-Capillary 145 (*)    All other components within normal limits  GLUCOSE, CAPILLARY - Abnormal; Notable for the following:    Glucose-Capillary 144 (*)    All other components within normal limits  GLUCOSE, CAPILLARY - Abnormal; Notable for the following:    Glucose-Capillary 130 (*)    All other components within normal limits  CLOSTRIDIUM DIFFICILE BY PCR (NOT AT [facility])  STOOL CULTURE  MRSA PCR SCREENING  CBC  COMPREHENSIVE METABOLIC PANEL    Imaging Review No results found.   EKG Interpretation None      MDM   Final diagnoses:  AKI (acute kidney injury)  Leukocytosis  C. difficile diarrhea    Labs: CBC, BMP, urinalysis, C. Difficile-Significant for WBC 14.6, BUN 49, 2.85  Imaging: none  Consults: Hospitalist  Therapeutics: NS  Assessment: AKI  Plan:  Patient presents with urinary retention, increased output from his colostomy bag. Patient has significant past medical history including septic shock with a 59-month hospitalization. Patient currently at [facility]. Over the last 2-3 days patient isincreased output from his ostomy, recently discontinued antibiotics for C. difficile approximately one week ago. This likely represents a recurrence of the C. difficile. Unable to confirm previous antibiotic therapy; also presents with mild leukocytosis. Patient has an acute kidney injury today, likely result from dehydration from increased output. Patient is well-appearing, nontoxic. Due to patient's continued output recent C. difficile infection and likely return, with acute kidney injury hospitalist consult at 4 further evaluation and management  for rehydration, repeat kidney function. My concern is patient going back to nursing facility with continued output with likely worsening renal function. Foley catheter was placed here in the  ED this patient had 200 mL of urine in his bladder and unable to urinate. This could likely be due to recent change in his Flomax from twice a day to once daily. Patient will need urology follow-up.     Okey Regal, PA-C 07/19/14 1859  Alfonzo Beers, MD 07/19/14 [phone]

## 2014-07-18 NOTE — ED Notes (Signed)
Per [facility], pt here for possible urinary retention, hasn't urinated since yesterday. Pt also c/o watery stool from colostomy bag(pt had ulcerative colitis in Jan). Pt also has gallbladder drain. Facility in and out cathed patient and did not get any urine. Pt has previous wound on sacrum and left leg, covered with bandage. Pt stood up from stretcher to bed. Pt in NAD. Denies any pain or pressure in bladder. AAOX4.

## 2014-07-19 DIAGNOSIS — G894 Chronic pain syndrome: Secondary | ICD-10-CM | POA: Diagnosis present

## 2014-07-19 DIAGNOSIS — R627 Adult failure to thrive: Secondary | ICD-10-CM | POA: Diagnosis present

## 2014-07-19 DIAGNOSIS — A047 Enterocolitis due to Clostridium difficile: Secondary | ICD-10-CM

## 2014-07-19 DIAGNOSIS — N183 Chronic kidney disease, stage 3 (moderate): Secondary | ICD-10-CM | POA: Diagnosis present

## 2014-07-19 DIAGNOSIS — M4628 Osteomyelitis of vertebra, sacral and sacrococcygeal region: Secondary | ICD-10-CM | POA: Diagnosis present

## 2014-07-19 DIAGNOSIS — Z932 Ileostomy status: Secondary | ICD-10-CM | POA: Diagnosis not present

## 2014-07-19 DIAGNOSIS — D638 Anemia in other chronic diseases classified elsewhere: Secondary | ICD-10-CM | POA: Diagnosis present

## 2014-07-19 DIAGNOSIS — A0472 Enterocolitis due to Clostridium difficile, not specified as recurrent: Secondary | ICD-10-CM | POA: Diagnosis present

## 2014-07-19 DIAGNOSIS — D72829 Elevated white blood cell count, unspecified: Secondary | ICD-10-CM | POA: Diagnosis present

## 2014-07-19 DIAGNOSIS — Z681 Body mass index (BMI) 19 or less, adult: Secondary | ICD-10-CM | POA: Diagnosis not present

## 2014-07-19 DIAGNOSIS — S81802D Unspecified open wound, left lower leg, subsequent encounter: Secondary | ICD-10-CM

## 2014-07-19 DIAGNOSIS — Z794 Long term (current) use of insulin: Secondary | ICD-10-CM | POA: Diagnosis not present

## 2014-07-19 DIAGNOSIS — E46 Unspecified protein-calorie malnutrition: Secondary | ICD-10-CM | POA: Diagnosis present

## 2014-07-19 DIAGNOSIS — E118 Type 2 diabetes mellitus with unspecified complications: Secondary | ICD-10-CM | POA: Diagnosis not present

## 2014-07-19 DIAGNOSIS — R339 Retention of urine, unspecified: Secondary | ICD-10-CM | POA: Diagnosis present

## 2014-07-19 DIAGNOSIS — E860 Dehydration: Secondary | ICD-10-CM | POA: Diagnosis present

## 2014-07-19 DIAGNOSIS — N179 Acute kidney failure, unspecified: Secondary | ICD-10-CM | POA: Diagnosis present

## 2014-07-19 DIAGNOSIS — H5441 Blindness, right eye, normal vision left eye: Secondary | ICD-10-CM | POA: Diagnosis present

## 2014-07-19 DIAGNOSIS — E876 Hypokalemia: Secondary | ICD-10-CM | POA: Diagnosis not present

## 2014-07-19 DIAGNOSIS — E872 Acidosis: Secondary | ICD-10-CM | POA: Diagnosis not present

## 2014-07-19 DIAGNOSIS — R198 Other specified symptoms and signs involving the digestive system and abdomen: Secondary | ICD-10-CM | POA: Diagnosis not present

## 2014-07-19 DIAGNOSIS — Z79891 Long term (current) use of opiate analgesic: Secondary | ICD-10-CM | POA: Diagnosis not present

## 2014-07-19 DIAGNOSIS — D473 Essential (hemorrhagic) thrombocythemia: Secondary | ICD-10-CM | POA: Diagnosis present

## 2014-07-19 DIAGNOSIS — E038 Other specified hypothyroidism: Secondary | ICD-10-CM

## 2014-07-19 DIAGNOSIS — R197 Diarrhea, unspecified: Secondary | ICD-10-CM | POA: Diagnosis present

## 2014-07-19 DIAGNOSIS — E034 Atrophy of thyroid (acquired): Secondary | ICD-10-CM

## 2014-07-19 DIAGNOSIS — E1165 Type 2 diabetes mellitus with hyperglycemia: Secondary | ICD-10-CM | POA: Diagnosis present

## 2014-07-19 DIAGNOSIS — Z887 Allergy status to serum and vaccine status: Secondary | ICD-10-CM | POA: Diagnosis not present

## 2014-07-19 DIAGNOSIS — E039 Hypothyroidism, unspecified: Secondary | ICD-10-CM | POA: Diagnosis present

## 2014-07-19 DIAGNOSIS — Z9049 Acquired absence of other specified parts of digestive tract: Secondary | ICD-10-CM | POA: Diagnosis present

## 2014-07-19 DIAGNOSIS — I129 Hypertensive chronic kidney disease with stage 1 through stage 4 chronic kidney disease, or unspecified chronic kidney disease: Secondary | ICD-10-CM | POA: Diagnosis present

## 2014-07-19 DIAGNOSIS — L89154 Pressure ulcer of sacral region, stage 4: Secondary | ICD-10-CM | POA: Diagnosis present

## 2014-07-19 DIAGNOSIS — Z933 Colostomy status: Secondary | ICD-10-CM | POA: Diagnosis not present

## 2014-07-19 DIAGNOSIS — Z7982 Long term (current) use of aspirin: Secondary | ICD-10-CM | POA: Diagnosis not present

## 2014-07-19 LAB — GLUCOSE, CAPILLARY
GLUCOSE-CAPILLARY: 130 mg/dL — AB (ref 65–99)
Glucose-Capillary: 168 mg/dL — ABNORMAL HIGH (ref 65–99)
Glucose-Capillary: 145 mg/dL — ABNORMAL HIGH (ref 65–99)
Glucose-Capillary: 144 mg/dL — ABNORMAL HIGH (ref 65–99)
Glucose-Capillary: 129 mg/dL — ABNORMAL HIGH (ref 65–99)

## 2014-07-19 LAB — CBC
HCT: 31.6 % — ABNORMAL LOW (ref 39.0–52.0)
Hemoglobin: 9.7 g/dL — ABNORMAL LOW (ref 13.0–17.0)
MCH: 29 pg (ref 26.0–34.0)
MCHC: 30.7 g/dL (ref 30.0–36.0)
MCV: 94.6 fL (ref 78.0–100.0)
Platelets: 597 10*3/uL — ABNORMAL HIGH (ref 150–400)
RBC: 3.34 MIL/uL — ABNORMAL LOW (ref 4.22–5.81)
RDW: 20.1 % — AB (ref 11.5–15.5)
WBC: 15.2 10*3/uL — ABNORMAL HIGH (ref 4.0–10.5)

## 2014-07-19 LAB — URINALYSIS, ROUTINE W REFLEX MICROSCOPIC
Bilirubin Urine: NEGATIVE
Glucose, UA: NEGATIVE mg/dL
Hgb urine dipstick: NEGATIVE
Ketones, ur: NEGATIVE mg/dL
Nitrite: NEGATIVE
Protein, ur: NEGATIVE mg/dL
Specific Gravity, Urine: 1.016 (ref 1.005–1.030)
Urobilinogen, UA: 0.2 mg/dL (ref 0.0–1.0)
pH: 5 (ref 5.0–8.0)

## 2014-07-19 LAB — BASIC METABOLIC PANEL
ANION GAP: 11 (ref 5–15)
BUN: 52 mg/dL — ABNORMAL HIGH (ref 6–20)
CALCIUM: 10.5 mg/dL — AB (ref 8.9–10.3)
CO2: 17 mmol/L — AB (ref 22–32)
Chloride: 108 mmol/L (ref 101–111)
Creatinine, Ser: 2.86 mg/dL — ABNORMAL HIGH (ref 0.61–1.24)
GFR calc non Af Amer: 21 mL/min — ABNORMAL LOW (ref >60)
GFR, EST AFRICAN AMERICAN: 24 mL/min — AB (ref >60)
GLUCOSE: 166 mg/dL — AB (ref 65–99)
POTASSIUM: 3.7 mmol/L (ref 3.5–5.1)
Sodium: 136 mmol/L (ref 135–145)

## 2014-07-19 LAB — MRSA PCR SCREENING: MRSA BY PCR: POSITIVE — AB

## 2014-07-19 LAB — URINE MICROSCOPIC-ADD ON

## 2014-07-19 LAB — CLOSTRIDIUM DIFFICILE BY PCR: Toxigenic C. Difficile by PCR: NEGATIVE

## 2014-07-19 MED ORDER — TAMSULOSIN HCL 0.4 MG PO CAPS
0.4000 mg | ORAL_CAPSULE | Freq: Every day | ORAL | Status: DC
  Administered 2014-07-19 – 2014-07-24 (×6): 0.4 mg via ORAL
  Filled 2014-07-19 (×6): qty 1

## 2014-07-19 MED ORDER — AMLODIPINE BESYLATE 10 MG PO TABS
10.0000 mg | ORAL_TABLET | Freq: Every day | ORAL | Status: DC
  Administered 2014-07-19 – 2014-07-24 (×6): 10 mg via ORAL
  Filled 2014-07-19 (×6): qty 1

## 2014-07-19 MED ORDER — DOXAZOSIN MESYLATE 1 MG PO TABS
1.0000 mg | ORAL_TABLET | Freq: Every day | ORAL | Status: DC
  Administered 2014-07-19 – 2014-07-22 (×4): 1 mg via ORAL
  Filled 2014-07-19 (×4): qty 1

## 2014-07-19 MED ORDER — FENTANYL 50 MCG/HR TD PT72
50.0000 ug | MEDICATED_PATCH | TRANSDERMAL | Status: DC
  Administered 2014-07-19 – 2014-07-22 (×2): 50 ug via TRANSDERMAL
  Filled 2014-07-19 (×2): qty 1

## 2014-07-19 MED ORDER — ASPIRIN 81 MG PO CHEW
81.0000 mg | CHEWABLE_TABLET | Freq: Every day | ORAL | Status: DC
  Administered 2014-07-19 – 2014-07-24 (×6): 81 mg via ORAL
  Filled 2014-07-19 (×6): qty 1

## 2014-07-19 MED ORDER — INSULIN ASPART 100 UNIT/ML ~~LOC~~ SOLN: 0.0000 [IU] | Freq: Every day | SUBCUTANEOUS | Status: DC

## 2014-07-19 MED ORDER — HYDROMORPHONE HCL 1 MG/ML IJ SOLN
0.5000 mg | INTRAMUSCULAR | Status: DC | PRN
  Administered 2014-07-19 – 2014-07-20 (×4): 1 mg via INTRAVENOUS
  Filled 2014-07-19 (×4): qty 1

## 2014-07-19 MED ORDER — OXYCODONE HCL 5 MG PO TABS
5.0000 mg | ORAL_TABLET | ORAL | Status: DC | PRN
  Administered 2014-07-22 – 2014-07-23 (×4): 5 mg via ORAL
  Filled 2014-07-19 (×4): qty 1

## 2014-07-19 MED ORDER — INSULIN GLARGINE 100 UNIT/ML ~~LOC~~ SOLN
20.0000 [IU] | Freq: Every day | SUBCUTANEOUS | Status: DC
  Administered 2014-07-19 – 2014-07-20 (×2): 20 [IU] via SUBCUTANEOUS
  Filled 2014-07-19 (×4): qty 0.2

## 2014-07-19 MED ORDER — MIRTAZAPINE 15 MG PO TABS
15.0000 mg | ORAL_TABLET | Freq: Every day | ORAL | Status: DC
  Administered 2014-07-19 – 2014-07-23 (×5): 15 mg via ORAL
  Filled 2014-07-19 (×6): qty 1

## 2014-07-19 MED ORDER — ONDANSETRON HCL 4 MG PO TABS: 4.0000 mg | ORAL_TABLET | Freq: Four times a day (QID) | ORAL | Status: DC | PRN

## 2014-07-19 MED ORDER — VANCOMYCIN 50 MG/ML ORAL SOLUTION
125.0000 mg | Freq: Four times a day (QID) | ORAL | Status: DC
  Administered 2014-07-19: 125 mg via ORAL
  Filled 2014-07-19 (×4): qty 2.5

## 2014-07-19 MED ORDER — VANCOMYCIN 50 MG/ML ORAL SOLUTION: 125.0000 mg | Freq: Every day | ORAL | Status: DC

## 2014-07-19 MED ORDER — INSULIN ASPART 100 UNIT/ML ~~LOC~~ SOLN
0.0000 [IU] | SUBCUTANEOUS | Status: DC
  Administered 2014-07-19: 2 [IU] via SUBCUTANEOUS
  Administered 2014-07-19 (×2): 1 [IU] via SUBCUTANEOUS

## 2014-07-19 MED ORDER — VANCOMYCIN 50 MG/ML ORAL SOLUTION: 125.0000 mg | ORAL | Status: DC

## 2014-07-19 MED ORDER — ACETAMINOPHEN 325 MG PO TABS: 650.0000 mg | ORAL_TABLET | Freq: Four times a day (QID) | ORAL | Status: DC | PRN

## 2014-07-19 MED ORDER — ONDANSETRON HCL 4 MG/2ML IJ SOLN: 4.0000 mg | Freq: Four times a day (QID) | INTRAMUSCULAR | Status: DC | PRN

## 2014-07-19 MED ORDER — LEVOTHYROXINE SODIUM 100 MCG PO TABS
100.0000 ug | ORAL_TABLET | Freq: Every day | ORAL | Status: DC
  Administered 2014-07-19 – 2014-07-24 (×6): 100 ug via ORAL
  Filled 2014-07-19 (×6): qty 1

## 2014-07-19 MED ORDER — PANTOPRAZOLE SODIUM 40 MG PO TBEC
40.0000 mg | DELAYED_RELEASE_TABLET | Freq: Every day | ORAL | Status: DC
  Administered 2014-07-19: 40 mg via ORAL
  Filled 2014-07-19: qty 1

## 2014-07-19 MED ORDER — FENTANYL CITRATE (PF) 100 MCG/2ML IJ SOLN
25.0000 ug | Freq: Once | INTRAMUSCULAR | Status: AC
  Administered 2014-07-19: 25 ug via INTRAVENOUS

## 2014-07-19 MED ORDER — INSULIN ASPART 100 UNIT/ML ~~LOC~~ SOLN
0.0000 [IU] | Freq: Three times a day (TID) | SUBCUTANEOUS | Status: DC
  Administered 2014-07-20 – 2014-07-24 (×4): 1 [IU] via SUBCUTANEOUS

## 2014-07-19 MED ORDER — SACCHAROMYCES BOULARDII 250 MG PO CAPS
250.0000 mg | ORAL_CAPSULE | Freq: Two times a day (BID) | ORAL | Status: DC
  Administered 2014-07-19 – 2014-07-24 (×11): 250 mg via ORAL
  Filled 2014-07-19 (×12): qty 1

## 2014-07-19 MED ORDER — ACETAMINOPHEN 650 MG RE SUPP
650.0000 mg | Freq: Four times a day (QID) | RECTAL | Status: DC | PRN
Start: 2014-07-19 — End: 2014-07-22

## 2014-07-19 MED ORDER — SODIUM CHLORIDE 0.9 % IV SOLN: INTRAVENOUS | Status: DC

## 2014-07-19 MED ORDER — FERROUS SULFATE 325 (65 FE) MG PO TABS
325.0000 mg | ORAL_TABLET | Freq: Every day | ORAL | Status: DC
  Administered 2014-07-19 – 2014-07-24 (×6): 325 mg via ORAL
  Filled 2014-07-19 (×7): qty 1

## 2014-07-19 MED ORDER — ADULT MULTIVITAMIN W/MINERALS CH
1.0000 | ORAL_TABLET | Freq: Every day | ORAL | Status: DC
  Administered 2014-07-19 – 2014-07-24 (×6): 1 via ORAL
  Filled 2014-07-19 (×6): qty 1

## 2014-07-19 MED ORDER — FENTANYL CITRATE (PF) 100 MCG/2ML IJ SOLN
50.0000 ug | Freq: Once | INTRAMUSCULAR | Status: DC
  Filled 2014-07-19: qty 2

## 2014-07-19 MED ORDER — VANCOMYCIN 50 MG/ML ORAL SOLUTION: 125.0000 mg | Freq: Two times a day (BID) | ORAL | Status: DC

## 2014-07-19 MED ORDER — HYDROMORPHONE HCL 2 MG PO TABS: 2.0000 mg | ORAL_TABLET | Freq: Four times a day (QID) | ORAL | Status: DC | PRN

## 2014-07-19 MED ORDER — SODIUM CHLORIDE 0.9 % IV SOLN
INTRAVENOUS | Status: DC
  Administered 2014-07-19: 1 mL via INTRAVENOUS

## 2014-07-19 MED ORDER — ALUM & MAG HYDROXIDE-SIMETH 200-200-20 MG/5ML PO SUSP: 30.0000 mL | Freq: Four times a day (QID) | ORAL | Status: DC | PRN

## 2014-07-19 MED ORDER — HEPARIN SODIUM (PORCINE) 5000 UNIT/ML IJ SOLN
5000.0000 [IU] | Freq: Three times a day (TID) | INTRAMUSCULAR | Status: DC
  Administered 2014-07-19 – 2014-07-24 (×16): 5000 [IU] via SUBCUTANEOUS
  Filled 2014-07-19 (×14): qty 1

## 2014-07-19 MED ORDER — CARVEDILOL 12.5 MG PO TABS
12.5000 mg | ORAL_TABLET | Freq: Two times a day (BID) | ORAL | Status: DC
  Administered 2014-07-19 – 2014-07-24 (×11): 12.5 mg via ORAL
  Filled 2014-07-19: qty 2
  Filled 2014-07-19 (×2): qty 1
  Filled 2014-07-19: qty 2
  Filled 2014-07-19 (×7): qty 1

## 2014-07-19 NOTE — Progress Notes (Addendum)
PROGRESS NOTE    Gabriel Lynch SWN:[id number] DOB: January 07, 1946 DOA: 07/18/2014 PCP: [name], MD  HPI/Brief narrative 69 y.o. male with a complex medical history who was sent from the [facility] SNF due to problems with Urinary retention, and worsening of his wounds along with increased output from his colostomy. He recently completed a course of Oral Vancomycin for C.Diff Colitison 06/27, and shortly thereafter he has had increased watery stool for the past 3-4 days. He denies any nausea or vomiting or and fever or chills.   Of Note patient hada history of Sepsis and Septic Shock with Acute Renal Failure requiring Short Term Dialysis due to complications of Cholecystitis and had been in ICU, then transferred to [facility], then to [facility] for [facility], and then to [facility] 1 week ago.   Assessment/Plan:  Diarrhea/recently treated C. difficile colitis - Recently completed course of vancomycin for C. difficile colitis on 07/13/14-exact duration of treatment not known. - C. difficile PCR negative - ? Acute GE versus IBS from recent C. Difficile - Discontinue vancomycin. Continue probiotics - Follow stool culture results - May consider Imodium if not improving.  Dehydration - Likely due to GI losses - Mild hypercalcemia possibly related to same - Hydrate and follow CMP in a.m.  Acute on stage III chronic kidney disease - Related to GI losses - Creatinine on 04/28/14 was 1.28. Admitted with creatinine of 2.85 - Continue IV fluid hydration and follow BMP daily. - If creatinine not improving, consider renal ultrasound and nephrology consultation - Strict intake and output chart - NAG likely secondary to diarrhea  Urinary retention - Coud Foley placed in ED - Did not have Foley catheter prior to admission but had one placed remotely per patient - Continue tamsulosin - May need to discharge on fully catheter with outpatient  consultation with urology  Multiple wounds-stage IV sacral pressure ulcer/sacral osteomyelitis, healed skin tears/avulsion dorsum of left hand, healed ulcer of left pretibial region - WOC consultation 7/3 appreciated and management per them.  Uncontrolled type II DM - Continue Levemir and SSI  Anemia of chronic disease - Follow CBCs  Hypothyroid - Continue Synthroid  Chronic pain syndrome - Pain management  Cholecystitis, status post percutaneous cholecystostomy - Stable.? Further plans - As per discharge summary 03/10/14, percutaneous cholecystostomy tube was supposed to be in place for 6 weeks followed by surgical referral for cholecystectomy. [facility] consult surgery for opinion.  Essential hypertension - Continue amlodipine, carvedilol and Doxazosin  Left mandibular angle superficial soft tissue mass 1.6 x 1.3 cm-likely sebaceous cyst - As per follow up recommendations during previous discharge 03/10/14, outpatient workup.  History of ulcerative colitis status post colectomy and colostomy  Failure to thrive    DVT prophylaxis: Lovenox Code Status: Full Family Communication: Discussed with patient's brother at bedside on 7/3 Disposition Plan: ? DC to SNF when medically stable   Consultants:  WOC  Procedures:  Coud Foley catheter 07/19/14  Antibiotics:  Oral vancomycin-DC'd   Subjective: Overall does not feel any different compared to admission. Does feel that the colostomy output is decreasing. Complaints of sacral pain at site of bedsore.  Objective: Filed Vitals:   07/19/14 0215 07/19/14 0236 07/19/14 0441 07/19/14 1241  BP: 133/75 140/72 138/68 127/67  Pulse:  78 76 65  Temp:  98.2 F (36.8 C) 98 F (36.7 C) 98.4 F (36.9 C)  TempSrc:  Oral Oral Oral  Resp:  17 16 18   Height:  6\' 2"  (1.88  m)    Weight:  68.72 kg (151 lb 8 oz)    SpO2:  100% 98% 100%    Intake/Output Summary (Last 24 hours) at 07/19/14 1250 Last data filed at 07/19/14 0906   Gross per 24 hour  Intake   1520 ml  Output   1410 ml  Net    110 ml   Filed Weights   07/19/14 0236  Weight: 68.72 kg (151 lb 8 oz)     Exam:  General exam: Moderately built and frail chronically ill-looking male lying comfortably supine in bed. Respiratory system: Clear. No increased work of breathing. Cardiovascular system: S1 & S2 heard, RRR. No JVD, murmurs, gallops, clicks or pedal edema. Gastrointestinal system: Abdomen is nondistended, soft and nontender. Normal bowel sounds heard. Percutaneous cholecystostomy site drain intact. Colostomy draining small liquid stools. Indwelling Foley catheter. Central nervous system: Alert and oriented. No focal neurological deficits. Extremities: Symmetric 5 x 5 power. Skin: Detailed exam as per WOC on sedation on 7/3. Approximately 5 cm diameter mid-sacral stage IV clean ulcer without signs of infection. Dressing over left dorsal hand and left shin clean, dry and intact.   Data Reviewed: Basic Metabolic Panel:  Recent Labs Lab 07/18/14 2218 07/19/14 0445  NA 135 136  K 4.1 3.7  CL 105 108  CO2 20* 17*  GLUCOSE 150* 166*  BUN 49* 52*  CREATININE 2.85* 2.86*  CALCIUM 11.3* 10.5*   Liver Function Tests: No results for input(s): AST, ALT, ALKPHOS, BILITOT, PROT, ALBUMIN in the last 168 hours. No results for input(s): LIPASE, AMYLASE in the last 168 hours. No results for input(s): AMMONIA in the last 168 hours. CBC:  Recent Labs Lab 07/18/14 2218 07/19/14 0445  WBC 14.6* 15.2*  NEUTROABS 8.7*  --   HGB 9.9* 9.7*  HCT 31.9* 31.6*  MCV 95.5 94.6  PLT 723* 597*   Cardiac Enzymes: No results for input(s): CKTOTAL, CKMB, CKMBINDEX, TROPONINI in the last 168 hours. BNP (last 3 results) No results for input(s): PROBNP in the last 8760 hours. CBG:  Recent Labs Lab 07/19/14 0331 07/19/14 0746  GLUCAP 168* 145*    Recent Results (from the past 240 hour(s))  Clostridium Difficile by PCR (not at [facility])     Status: None    Collection Time: 07/18/14  8:58 PM  Result Value Ref Range Status   C difficile by pcr NEGATIVE NEGATIVE Final       Studies: No results found.      Scheduled Meds: . amLODipine  10 mg Oral Daily  . aspirin  81 mg Oral Daily  . carvedilol  12.5 mg Oral BID WC  . doxazosin  1 mg Oral Daily  . fentaNYL  50 mcg Transdermal Q72H  . ferrous sulfate  325 mg Oral Q breakfast  . heparin  5,000 Units Subcutaneous 3 times per day  . insulin aspart  0-9 Units Subcutaneous 6 times per day  . insulin glargine  20 Units Subcutaneous QHS  . levothyroxine  100 mcg Oral QAC breakfast  . mirtazapine  15 mg Oral QHS  . multivitamin with minerals  1 tablet Oral Daily  . pantoprazole  40 mg Oral Daily  . saccharomyces boulardii  250 mg Oral BID  . tamsulosin  0.4 mg Oral QPC breakfast  . vancomycin  125 mg Oral QID   Followed by  . [START ON 07/26/2014] vancomycin  125 mg Oral BID   Followed by  . [START ON 08/02/2014] vancomycin  125 mg  Oral Daily   Followed by  . [START ON 08/09/2014] vancomycin  125 mg Oral QODAY   Followed by  . [START ON 08/17/2014] vancomycin  125 mg Oral Q3 days   Continuous Infusions: . sodium chloride 1 mL (07/19/14 0336)    Principal Problem:   AKI (acute kidney injury) Active Problems:   Stage IV pressure ulcer of sacral region   Wound of left lower extremity   CKD (chronic kidney disease) stage 3, GFR 30-59 ml/min   DM type 2, controlled, with complication   Anemia, chronic disease   Hypothyroidism   Chronic pain syndrome   C. difficile diarrhea   Urinary retention   Leukocytosis    Time spent: 45 minutes.    Vernell Leep, MD, FACP, FHM. [facility] Pager [phone]  If 7PM-7AM, please contact night-coverage [email or web address] Password [email or web address] 07/19/2014, 12:50 PM    LOS: 0 days

## 2014-07-19 NOTE — H&P (Addendum)
[facility] Admission History and Physical       Gabriel Lynch GBT:[id number] DOB: March 09, 1945 DOA: 07/18/2014  Referring physician: EDP PCP: Gabriel Brothers, MD  Specialists:   Chief Complaint:   HPI: Gabriel Lynch is a 69 y.o. male with a complex medical history who was sent from the [facility] SNF due to problems with  Urinary retention, and worsening of his wounds along with increased output from his colostomy.  He recently completed a course of Oral Vancomycin for C.Diff Colitis  onn 06/27, and shortly thereafter he has had increased watery stool for the past 3-4 days.    He denies any nausea or vomiting or and fever or chills.     Of Note patient had  A history of Sepsis and Septic Shock with Acute Renal Failure requiring Short Term Dialysis due to complications of Cholecystitis and had been in ICU, then transferred to [facility], then to [facility] for [place], and then to [facility] SNF[facility] 1 week ago.     Review of Systems:  Constitutional: No Weight Loss, No Weight Gain, Night Sweats, Fevers, Chills, Dizziness, Light Headedness, Fatigue, or Generalized Weakness HEENT: No Headaches, Difficulty Swallowing,Tooth/Dental Problems,Sore Throat,  No Sneezing, Rhinitis, Ear Ache, Nasal Congestion, or Post Nasal Drip,  Cardio-vascular:  No Chest pain, Orthopnea, PND, Edema in Lower Extremities, Anasarca, Dizziness, Palpitations  Resp: No Dyspnea, No DOE, No Productive Cough, No Non-Productive Cough, No Hemoptysis, No Wheezing.    GI: No Heartburn, Indigestion, Abdominal Pain, Nausea, Vomiting, Diarrhea, Constipation, Hematemesis, Hematochezia, Melena, Change in Bowel Habits,  Loss of Appetite  GU: No Dysuria, No Change in Color of Urine, No Urgency or Urinary Frequency, No Flank pain.  Musculoskeletal: No Joint Pain or Swelling, No Decreased Range of Motion, No Back Pain.  Neurologic: No Syncope, No Seizures, Muscle Weakness, Paresthesia, Vision  Disturbance or Loss, No Diplopia, No Vertigo, No Difficulty Walking,  Skin: No Rash or Lesions. Psych: No Change in Mood or Affect, No Depression or Anxiety, No Memory loss, No Confusion, or Hallucinations   Past Medical History  Diagnosis Date  . Ulcerative colitis   . Diabetes mellitus without complication   . Hypertension   . Iron deficiency anemia   . Hypothyroidism   . 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 Back surgery      multiple surgeries per family  . 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 Appendectomy    . Debridement of sacral decubitus ulcer  01-26-14      Prior to Admission medications   Medication Sig Start Date End Date Taking? Authorizing Provider  amLODipine (NORVASC) 10 MG tablet Take 10 mg by mouth daily.    Historical Provider, MD  aspirin 81 MG tablet Take 81 mg by mouth daily.    Historical Provider, MD  calcium acetate (PHOSLO) 667 MG capsule Take 667 mg by mouth daily.    Historical Provider, MD  carvedilol (COREG) 12.5 MG tablet Take 12.5 mg by mouth 2 (two) times daily with a meal.    Historical Provider, MD  doxazosin (CARDURA) 1 MG tablet Take 1 mg by mouth daily.    Historical Provider, MD  fentaNYL (DURAGESIC - DOSED MCG/HR) 50 MCG/HR Place 50 mcg onto the skin every 3 (three) days.    Historical Provider, MD  ferrous sulfate 325 (65 FE) MG tablet Take 325 mg  by mouth daily with breakfast.    Historical Provider, MD  HYDROmorphone (DILAUDID) 2 MG tablet Take 2 mg by mouth every 6 (six) hours as needed for severe pain.    Historical Provider, MD  insulin aspart (NOVOLOG) 100 UNIT/ML injection Inject 0-15 Units into the skin every 4 (four) hours. 03/10/14   Gabriel Kristeen Mans, MD  insulin glargine (LANTUS) 100 UNIT/ML injection Inject 20 Units into the skin at bedtime.    Historical Provider, MD  levothyroxine (SYNTHROID, LEVOTHROID) 100 MCG tablet Take 100 mcg by mouth  daily before breakfast.    Historical Provider, MD  mirtazapine (REMERON) 15 MG tablet Take 15 mg by mouth at bedtime.    Historical Provider, MD  Multiple Vitamins-Minerals (MULTIVITAMIN WITH MINERALS) tablet Take 1 tablet by mouth daily.    Historical Provider, MD  omeprazole (PRILOSEC) 20 MG capsule Take 20 mg by mouth daily.    Historical Provider, MD  ondansetron (ZOFRAN) 4 MG tablet Take 4 mg by mouth every 4 (four) hours as needed for nausea or vomiting.    Historical Provider, MD  saccharomyces boulardii (FLORASTOR) 250 MG capsule Take 250 mg by mouth 2 (two) times daily.    Historical Provider, MD  sodium polystyrene (KAYEXALATE) 15 GM/60ML suspension Take 15 g by mouth every morning.    Historical Provider, MD  tamsulosin (FLOMAX) 0.4 MG CAPS capsule Take 0.4 mg by mouth daily after breakfast.    Historical Provider, MD     Allergies  Allergen Reactions  . Influenza Vaccines Other (See Comments)    ON MAR    Social History:  reports that he does not drink alcohol or use illicit drugs. His tobacco history is not on file.    No family history on file.     Physical Exam:  GEN:  Pleasant  Debilitated Elderly Cachectic 69 y.o. African [place] male examined and in no acute distress; cooperative with exam Filed Vitals:   07/19/14 0015 07/19/14 0100 07/19/14 0115 07/19/14 0145  BP: 128/70 138/65 132/67 131/64  Pulse: 78   76  Temp:      TempSrc:      Resp:      SpO2: 100%   100%   Blood pressure 131/64, pulse 76, temperature 98.1 F (36.7 C), temperature source Oral, resp. rate 18, SpO2 100 %. PSYCH: He is alert and oriented x4; does not appear anxious does not appear depressed; affect is normal HEENT: Normocephalic and Atraumatic, Mucous membranes pink;   Right eye Prosthesis Present, Left eye with Reactive pupil and EOMI, and   Benign Fundi; No scleral icterus, Nares: Patent, Oropharynx: Clear,  Neck:  FROM, No Cervical Lymphadenopathy nor Thyromegaly   or Carotid Bruit;  No JVD; Breasts:: Not examined CHEST WALL: No tenderness CHEST: Normal respiration, clear to auscultation bilaterally HEART: Regular rate and rhythm; no murmurs rubs or gallops BACK: No kyphosis or scoliosis; No CVA tenderness,  + stage IV Sacral Ulcer 4 cm diameter ABDOMEN: Positive Bowel Sounds, Scaphoid,+ Cholecystostomy Drain present in RUQ,   +Colostomy Draining Watery Light Brown Fluid,    ABD is Soft Non-Tender, No Rebound or Guarding; No Masses, No Organomegaly Rectal Exam: Not done EXTREMITIES: No Cyanosis, Clubbing, or Edema; +Ulcerations to Left Dorsal Hand and ForeArm, Left AnteriorTibial Area. Genitalia: not examined PULSES: 2+ and symmetric SKIN: Normal hydration no rash or ulceration CNS:  Alert and Oriented x 4, Blindness Right Eye,   Vascular: pulses palpable throughout    Labs on Admission:  Basic Metabolic Panel:  Recent Labs Lab 07/18/14 2218  NA 135  K 4.1  CL 105  CO2 20*  GLUCOSE 150*  BUN 49*  CREATININE 2.85*  CALCIUM 11.3*   Liver Function Tests: No results for input(s): AST, ALT, ALKPHOS, BILITOT, PROT, ALBUMIN in the last 168 hours. No results for input(s): LIPASE, AMYLASE in the last 168 hours. No results for input(s): AMMONIA in the last 168 hours. CBC:  Recent Labs Lab 07/18/14 2218  WBC 14.6*  NEUTROABS 8.7*  HGB 9.9*  HCT 31.9*  MCV 95.5  PLT 723*   Cardiac Enzymes: No results for input(s): CKTOTAL, CKMB, CKMBINDEX, TROPONINI in the last 168 hours.  BNP (last 3 results) No results for input(s): BNP in the last 8760 hours.  ProBNP (last 3 results) No results for input(s): PROBNP in the last 8760 hours.  CBG: No results for input(s): GLUCAP in the last 168 hours.  Radiological Exams on Admission: No results found.    Assessment/Plan:   69 y.o. male with  Principal Problem:   1.    AKI (acute kidney injury)   IVFs   Monitor BUN/CR   Hold Nephrotoxic Agents   Active Problems:   2.   C. difficile diarrhea   Enteric  Precautions   C.diff PCR, and Stool for C+S sent   2nd Tier Rx for C.Diff Started   Hold Kayexalate Rx ( on Kayexalete Daily)     3.   Urinary retention   Coude Foley placed in ED   Continue Tamsulosin     4.   Stage IV pressure ulcer of sacral region   Wound CAre evaluation for continued Care      5.   Wound of left lower extremity and Wound of Left Lower Ext   Wound Care evaluation for continued care     6.   CKD (chronic kidney disease) stage 3, GFR 30-59 ml/min   Monitor BUN/Cr Trend     7.   DM type 2, controlled, with complication   Continue Levemir Insulin   SSI coverage PRN     8.   Anemia, chronic disease   Monitor Trend     9.   Hypothyroidism   Continue Levothyroxine   Check TSH level   10.   Chronic pain syndrome   Continue Fentanyl Patch and Oxycodone for Breakthrough Pain   11.   DVT Prophylaxis   Lovenox   12.   Malnutrition   Nutrition Consult for Protein Calorie Needs     Code Status:     FULL CODE      Family Communication:   Family at Bedside   Disposition Plan:    Inpatient  Status        Time spent:  84 Minutes      Theressa Millard [facility] Pager [phone]   If [place] Please Contact the Day Rounding Team MD for [facility]  If 7PM-7AM, Please Contact Night-Floor Coverage  [email or web address] Password [email or web address] 07/19/2014, 2:06 AM     ADDENDUM:   Patient was seen and examined on 07/19/2014

## 2014-07-19 NOTE — Consult Note (Signed)
WOC ostomy consult note Stoma type/location: RLQ, end ileostomy  Stomal assessment/size: 1" round, budded  Peristomal assessment: pouch intact, but noted wafer cut too large Treatment options for stomal/peristomal skin: will add 2" barrier ring with next pouch change  Output liquid, green with stool chunks, food particles Ostomy pouching: 1pc. Education provided: pt with stoma for some time, he is totally dependent on ostomy care from his caregivers.  Supplies ordered to the bedside for pouch change due to wafer cut too large and peristomal skin may suffer from the exposure to the effluent.    WOC wound consult note Reason for Consult: multiple wounds Wound type: Stage IV Pressure ulcer sacrum Healing skin tear/avulsion from fall left hand Healed ulcer on the left pretibial region, from spider bite per family and patient  Pressure Ulcer POA: Yes Measurement: Sacrum: 5cm x 5cm x 1.5cm  Left hand: 4cm x 1.5cm x 0.1cm Wound bed: Sacrum: 100% moist, pink, beefy red, early granulation tissue Left hand: 100% hypergranulation tissue Left pretibial healed but fragile tissue  Drainage (amount, consistency, odor) moderate serosanguineous from both the hand and the sacrum   Periwound: scarring at the hand wound, apparently this wound was much, much bigger per the patient and his brother. Sacrum periwound skin intact  Dressing procedure/placement/frequency: Add silver hydrofiber to pack the sacral wound for exudate management and recalcitrant wound, to fill the dead space, cover with foam. Change every other day.  Continue soft silicone foam to protect the left pretibial area.  Silicone foam for the hypergranulation tissue to the left hand.   Discussed POC with patient and bedside nurse.  Re consult if needed, will not follow at this time. Thanks  Aury Scollard Kellogg, [place] [phone])

## 2014-07-19 NOTE — ED Notes (Signed)
Colostomy bag emptied. Large amount of  yellow watery  stool

## 2014-07-19 NOTE — Progress Notes (Signed)
Changed ileostomy pouch which had blown up and pulled from the area near the umbilicus which has a dip.  Applied some barrier to build it up and applied the one piece pouch with a 1 inch stoma cut.

## 2014-07-19 NOTE — ED Notes (Signed)
Report to Melanie, RN

## 2014-07-19 NOTE — ED Notes (Signed)
Peri care complete prior to coude cath insertion

## 2014-07-19 NOTE — Progress Notes (Signed)
  Subjective: 69 y.o. male with a complex medical history who was sent from the [facility] SNF due to problems withincreased watery stool via ileostomy He recently completed a course of oral vancomycin for C.Diff Colitis on 06/27. He denies any nausea or vomiting or and fever or chills. He denies any abdominal pain. He had significant shock and prolonged hospitalization in February of this year for cholecystitis. Was treated with perc chole.  Was at [facility] and then snf and has not followed up since then Objective: Vital signs in last 24 hours: Temp:  [98 F (36.7 C)-98.4 F (36.9 C)] 98.4 F (36.9 C) (07/03 1241) Pulse Rate:  [65-78] 65 (07/03 1241) Resp:  [16-18] 18 (07/03 1241) BP: (120-141)/(63-78) 127/67 mmHg (07/03 1241) SpO2:  [97 %-100 %] 100 % (07/03 1241) Weight:  [68.72 kg (151 lb 8 oz)] 68.72 kg (151 lb 8 oz) (07/03 0236) Last BM Date: 07/19/14  Intake/Output from previous day: 07/02 0701 - 07/03 0700 In: 1520 [P.O.:340; I.V.:1180] Out: 1060 [Urine:200; Drains:60; Stool:800] Intake/Output this shift: Total I/O In: 990.4 [P.O.:400; I.V.:590.4] Out: 780 [Drains:30; Stool:750]  Abd: ileostomy in place and functional, perc chole in place no real drainage  Lab Results:   Recent Labs  07/18/14 2218 07/19/14 0445  WBC 14.6* 15.2*  HGB 9.9* 9.7*  HCT 31.9* 31.6*  PLT 723* 597*   BMET  Recent Labs  07/18/14 2218 07/19/14 0445  NA 135 136  K 4.1 3.7  CL 105 108  CO2 20* 17*  GLUCOSE 150* 166*  BUN 49* 52*  CREATININE 2.85* 2.86*  CALCIUM 11.3* 10.5*   PT/INR No results for input(s): LABPROT, INR in the last 72 hours. ABG No results for input(s): PHART, HCO3 in the last 72 hours.  Invalid input(s): PCO2, PO2  Studies/Results: No results found.  Anti-infectives: Anti-infectives    Start     Dose/Rate Route Frequency Ordered Stop   08/17/14 1000  vancomycin (VANCOCIN) 50 mg/mL oral solution 125 mg  Status:  Discontinued     125 mg Oral Every 3  DAYS 07/19/14 0224 07/19/14 1300   08/09/14 1000  vancomycin (VANCOCIN) 50 mg/mL oral solution 125 mg  Status:  Discontinued     125 mg Oral Every other day 07/19/14 0224 07/19/14 1300   08/02/14 1000  vancomycin (VANCOCIN) 50 mg/mL oral solution 125 mg  Status:  Discontinued     125 mg Oral Daily 07/19/14 0224 07/19/14 1300   07/26/14 1000  vancomycin (VANCOCIN) 50 mg/mL oral solution 125 mg  Status:  Discontinued     125 mg Oral 2 times daily 07/19/14 0224 07/19/14 1300   07/19/14 1000  vancomycin (VANCOCIN) 50 mg/mL oral solution 125 mg  Status:  Discontinued     125 mg Oral 4 times daily 07/19/14 0224 07/19/14 1300      Assessment/Plan: Prior cholecystitis with perc chole since February  Is much improved, will write for cholangiogram via perc chole then make decisions based on that study    [facility] 07/19/2014

## 2014-07-20 ENCOUNTER — Inpatient Hospital Stay (HOSPITAL_COMMUNITY): Payer: Medicare Other

## 2014-07-20 DIAGNOSIS — E860 Dehydration: Secondary | ICD-10-CM

## 2014-07-20 DIAGNOSIS — E872 Acidosis: Secondary | ICD-10-CM

## 2014-07-20 LAB — COMPREHENSIVE METABOLIC PANEL
ALBUMIN: 2.5 g/dL — AB (ref 3.5–5.0)
ALT: 28 U/L (ref 17–63)
AST: 21 U/L (ref 15–41)
Alkaline Phosphatase: 167 U/L — ABNORMAL HIGH (ref 38–126)
Anion gap: 10 (ref 5–15)
BUN: 60 mg/dL — ABNORMAL HIGH (ref 6–20)
CALCIUM: 9.7 mg/dL (ref 8.9–10.3)
CHLORIDE: 108 mmol/L (ref 101–111)
CO2: 15 mmol/L — AB (ref 22–32)
Creatinine, Ser: 2.96 mg/dL — ABNORMAL HIGH (ref 0.61–1.24)
GFR calc non Af Amer: 20 mL/min — ABNORMAL LOW (ref >60)
GFR, EST AFRICAN AMERICAN: 24 mL/min — AB (ref >60)
Glucose, Bld: 133 mg/dL — ABNORMAL HIGH (ref 65–99)
Potassium: 4.1 mmol/L (ref 3.5–5.1)
SODIUM: 133 mmol/L — AB (ref 135–145)
Total Bilirubin: 0.3 mg/dL (ref 0.3–1.2)
Total Protein: 7.3 g/dL (ref 6.5–8.1)

## 2014-07-20 LAB — CBC
HEMATOCRIT: 27 % — AB (ref 39.0–52.0)
Hemoglobin: 8.5 g/dL — ABNORMAL LOW (ref 13.0–17.0)
MCH: 30.5 pg (ref 26.0–34.0)
MCHC: 31.5 g/dL (ref 30.0–36.0)
MCV: 96.8 fL (ref 78.0–100.0)
PLATELETS: 558 10*3/uL — AB (ref 150–400)
RBC: 2.79 MIL/uL — ABNORMAL LOW (ref 4.22–5.81)
RDW: 19.7 % — ABNORMAL HIGH (ref 11.5–15.5)
WBC: 13.1 10*3/uL — AB (ref 4.0–10.5)

## 2014-07-20 LAB — GLUCOSE, CAPILLARY
GLUCOSE-CAPILLARY: 122 mg/dL — AB (ref 65–99)
GLUCOSE-CAPILLARY: 132 mg/dL — AB (ref 65–99)
Glucose-Capillary: 131 mg/dL — ABNORMAL HIGH (ref 65–99)
Glucose-Capillary: 98 mg/dL (ref 65–99)

## 2014-07-20 LAB — IRON AND TIBC
Iron: 49 ug/dL (ref 45–182)
SATURATION RATIOS: 19 % (ref 17.9–39.5)
TIBC: 265 ug/dL (ref 250–450)
UIBC: 216 ug/dL

## 2014-07-20 LAB — CREATININE, URINE, RANDOM: CREATININE, URINE: 101.71 mg/dL

## 2014-07-20 LAB — SODIUM, URINE, RANDOM: Sodium, Ur: <10 mmol/L

## 2014-07-20 LAB — FERRITIN: Ferritin: 454 ng/mL — ABNORMAL HIGH (ref 24–336)

## 2014-07-20 MED ORDER — STERILE WATER FOR INJECTION IV SOLN
INTRAVENOUS | Status: DC
  Administered 2014-07-20 – 2014-07-21 (×3): via INTRAVENOUS
  Filled 2014-07-20 (×5): qty 850

## 2014-07-20 MED ORDER — LOPERAMIDE HCL 2 MG PO CAPS
2.0000 mg | ORAL_CAPSULE | Freq: Two times a day (BID) | ORAL | Status: AC
  Administered 2014-07-20 (×2): 2 mg via ORAL
  Filled 2014-07-20 (×2): qty 1

## 2014-07-20 NOTE — Progress Notes (Signed)
PROGRESS NOTE    Gabriel Lynch DGL:[id number] DOB: [birth year] DOA: 07/18/2014 PCP: [name], MD  HPI/Brief narrative 69 y.o. male with a complex medical history who was sent from the [facility] due to problems with Urinary retention, and worsening of his wounds along with increased output from his colostomy. He recently completed a course of Oral Vancomycin for C.Diff Colitison 06/27, and shortly thereafter he has had increased watery stool for the past 3-4 days. He denies any nausea or vomiting or and fever or chills.   Of Note patient hada history of Sepsis and Septic Shock with Acute Renal Failure requiring Short Term Dialysis due to complications of Cholecystitis and had been in ICU, then transferred to [facility], then to [facility], and then to [facility] 1 week ago.   Assessment/Plan:  Diarrhea/recently treated C. difficile colitis - Recently completed course of vancomycin for C. difficile colitis on 07/13/14-exact duration of treatment not known. - C. difficile PCR negative - ? Acute GE versus IBS from recent C. Difficile - Discontinue vancomycin. Continue probiotics - Follow stool culture results - Persisting diarrhea. Trial of Imodium.  Dehydration - Likely due to GI losses - Mild hypercalcemia possibly related to same - Continue IV fluid hydration: Changed to bicarbonate drip by nephrology.  Acute on stage III chronic kidney disease/ NAG metabolic acidosis - Related to GI losses - Creatinine on 04/28/14 was 1.28. Admitted with creatinine of 2.85 - Creatinine increased to 2.9 despite IV fluid hydration. Likely still quite volume depleted. - Nephrology consulted and have increased IV fluids and changed to bicarbonate drip which should help his NAG metabolic acidosis - Renal ultrasound shows chronic medical renal disease without hydronephrosis.  Urinary retention - Coud Foley placed in ED - Did not have  Foley catheter prior to admission but had one placed remotely per patient - Continue tamsulosin - May need to discharge on fully catheter with outpatient consultation with urology  Multiple wounds-stage IV sacral pressure ulcer/sacral osteomyelitis, healed skin tears/avulsion dorsum of left hand, healed ulcer of left pretibial region - WOC consultation 7/3 appreciated and management per them.  Uncontrolled type II DM - Continue Levemir and SSI - Good inpatient control  Anemia of chronic disease - Follow CBCs - Gradually dropping and 8.5 today. Follow CBC daily and transfuse if hemoglobin less than 7 g per DL.  Hypothyroid - Continue Synthroid  Chronic pain syndrome - Pain management  Cholecystitis, status post percutaneous cholecystostomy - Stable.? Further plans - As per discharge summary 03/10/14, percutaneous cholecystostomy tube was supposed to be in place for 6 weeks followed by surgical referral for cholecystectomy. - Surgical consultation appreciated. Cholangiogram via tube on 7/5 in IR. We will then determine if needs replacement tube versus pull tube versus cholecystectomy.  Essential hypertension - Continue amlodipine, carvedilol and Doxazosin - Controlled  Left mandibular angle superficial soft tissue mass 1.6 x 1.3 cm-likely sebaceous cyst - As per follow up recommendations during previous discharge 03/10/14, outpatient workup.  History of ulcerative colitis status post colectomy and colostomy  Failure to thrive    DVT prophylaxis: Lovenox Code Status: Full Family Communication: Discussed with patient's brother at bedside on 7/4 Disposition Plan: ? DC to SNF when medically stable   Consultants:  [facility]  Nephrology  Procedures:  Coud Foley catheter 07/19/14  Antibiotics:  Oral vancomycin-DC'd   Subjective: No change in increased ostomy output. No abdominal pain.  Objective: Filed Vitals:   07/19/14 1241 07/19/14  2048 07/20/14  0548 07/20/14 1300  BP: 127/67 107/59 120/56 93/54  Pulse: 65 60 56 66  Temp: 98.4 F (36.9 C) 98.1 F (36.7 C) 98 F (36.7 C) 97.8 F (36.6 C)  TempSrc: Oral Oral Oral Oral  Resp: 18 16 16 17   Height:      Weight:      SpO2: 100% 100% 98% 98%    Intake/Output Summary (Last 24 hours) at 07/20/14 1513 Last data filed at 07/20/14 1428  Gross per 24 hour  Intake   3173 ml  Output   3265 ml  Net    -92 ml   Filed Weights   07/19/14 0236  Weight: 68.72 kg (151 lb 8 oz)     Exam:  General exam: Moderately built and frail chronically ill-looking male lying comfortably supine in bed. Respiratory system: Clear. No increased work of breathing. Cardiovascular system: S1 & S2 heard, RRR. No JVD, murmurs, gallops, clicks or pedal edema. Gastrointestinal system: Abdomen is nondistended, soft and nontender. Normal bowel sounds heard. Percutaneous cholecystostomy site drain intact. Colostomy draining small liquid stools. Indwelling Foley catheter. Central nervous system: Alert and oriented. No focal neurological deficits. Extremities: Symmetric 5 x 5 power. Skin: Detailed exam as per WOC on sedation on 7/3. Approximately 5 cm diameter mid-sacral stage IV clean ulcer without signs of infection. Dressing over left dorsal hand and left shin clean, dry and intact.   Data Reviewed: Basic Metabolic Panel:  Recent Labs Lab 07/18/14 2218 07/19/14 0445 07/20/14 0331  NA 135 136 133*  K 4.1 3.7 4.1  CL 105 108 108  CO2 20* 17* 15*  GLUCOSE 150* 166* 133*  BUN 49* 52* 60*  CREATININE 2.85* 2.86* 2.96*  CALCIUM 11.3* 10.5* 9.7   Liver Function Tests:  Recent Labs Lab 07/20/14 0331  AST 21  ALT 28  ALKPHOS 167*  BILITOT 0.3  PROT 7.3  ALBUMIN 2.5*   No results for input(s): LIPASE, AMYLASE in the last 168 hours. No results for input(s): AMMONIA in the last 168 hours. CBC:  Recent Labs Lab 07/18/14 2218 07/19/14 0445 07/20/14 0331  WBC 14.6* 15.2* 13.1*  NEUTROABS 8.7*   --   --   HGB 9.9* 9.7* 8.5*  HCT 31.9* 31.6* 27.0*  MCV 95.5 94.6 96.8  PLT 723* 597* 558*   Cardiac Enzymes: No results for input(s): CKTOTAL, CKMB, CKMBINDEX, TROPONINI in the last 168 hours. BNP (last 3 results) No results for input(s): PROBNP in the last 8760 hours. CBG:  Recent Labs Lab 07/19/14 1256 07/19/14 1638 07/19/14 2057 07/20/14 0749 07/20/14 1209  GLUCAP 144* 130* 129* 122* 131*    Recent Results (from the past 240 hour(s))  Clostridium Difficile by PCR (not at [facility])     Status: None   Collection Time: 07/18/14  8:58 PM  Result Value Ref Range Status   C difficile by pcr NEGATIVE NEGATIVE Final  Stool culture     Status: None (Preliminary result)   Collection Time: 07/19/14  5:37 AM  Result Value Ref Range Status   Specimen Description STOOL  Final   Special Requests Normal  Final   Culture   Final    Culture reincubated for better growth Performed at [facility]    Report Status PENDING  Incomplete  MRSA PCR Screening     Status: Abnormal   Collection Time: 07/19/14  1:54 PM  Result Value Ref Range Status   MRSA by PCR POSITIVE (A) NEGATIVE Final    Comment:  The GeneXpert MRSA Assay (FDA approved for NASAL specimens only), is one component of a comprehensive MRSA colonization surveillance program. It is not intended to diagnose MRSA infection nor to guide or monitor treatment for MRSA infections. RESULT CALLED TO, READ BACK BY AND VERIFIED WITH: ESTERA,S RN 07/19/14 1951 [place]        Studies: US Renal  07/20/2014   CLINICAL DATA:  Acute renal failure  EXAM: RENAL / URINARY TRACT ULTRASOUND COMPLETE  COMPARISON:  CT 03/06/2014  FINDINGS: Right Kidney:  Length: 12.4 cm. Increased echotexture throughout the right kidney. 2.2 cm lower pole cyst. No hydronephrosis.  Left Kidney:  Length: 11.9 cm. Increased echotexture throughout the left Kidney. No focal abnormality. No hydronephrosis.  Bladder:  Appears normal for degree of  bladder distention.  Incidentally noted are gallstones within the gallbladder.  IMPRESSION: Increased echotexture within the kidneys compatible with chronic medical renal disease. No hydronephrosis.  Cholelithiasis.   Electronically Signed   By: Rolm Baptise M.D.   On: 07/20/2014 14:20        Scheduled Meds: . amLODipine  10 mg Oral Daily  . aspirin  81 mg Oral Daily  . carvedilol  12.5 mg Oral BID WC  . doxazosin  1 mg Oral Daily  . fentaNYL  50 mcg Transdermal Q72H  . ferrous sulfate  325 mg Oral Q breakfast  . heparin  5,000 Units Subcutaneous 3 times per day  . insulin aspart  0-5 Units Subcutaneous QHS  . insulin aspart  0-9 Units Subcutaneous TID WC  . insulin glargine  20 Units Subcutaneous QHS  . levothyroxine  100 mcg Oral QAC breakfast  . mirtazapine  15 mg Oral QHS  . multivitamin with minerals  1 tablet Oral Daily  . saccharomyces boulardii  250 mg Oral BID  . tamsulosin  0.4 mg Oral QPC breakfast   Continuous Infusions: .  sodium bicarbonate 150 mEq in sterile water 1000 mL infusion 150 mL/hr at 07/20/14 1307    Principal Problem:   AKI (acute kidney injury) Active Problems:   Stage IV pressure ulcer of sacral region   Wound of left lower extremity   CKD (chronic kidney disease) stage 3, GFR 30-59 ml/min   DM type 2, controlled, with complication   Anemia, chronic disease   Hypothyroidism   Chronic pain syndrome   C. difficile diarrhea   Urinary retention   Leukocytosis    Time spent: 40 minutes.    Vernell Leep, MD, FACP, FHM. [facility] Pager [phone]  If 7PM-7AM, please contact night-coverage [email or web address] Password [email or web address] 07/20/2014, 3:13 PM    LOS: 1 day

## 2014-07-20 NOTE — Progress Notes (Signed)
PT Cancellation Note  Patient Details Name: Gabriel Lynch MRN: [id number] DOB: 07-15-1945   Cancelled Treatment:    Reason Eval/Treat Not Completed: Patient declined, no reason specified (Pt refused.  ).  Pt says he feels woozy and does not feel up to getting OOB.  Will return to see pt as time allows.  Thank you for this referral.  Joslyn Hy PT, DPT [phone] Pager: [phone] 07/20/2014, 9:30 AM

## 2014-07-20 NOTE — Care Management Note (Signed)
Case Management Note  Patient Details  Name: Brek Reece MRN: [id number] Date of Birth: 1945/12/10  Subjective/Objective:                    Action/Plan:  UR completed. Will await PT/OT evals  Expected Discharge Date:                  Expected Discharge Plan:     In-House Referral:     Discharge planning Services     Post Acute Care Choice:    Choice offered to:     DME Arranged:    DME Agency:     HH Arranged:    HH Agency:     Status of Service:  In process, will continue to follow  Medicare Important Message Given:    Date Medicare IM Given:    Medicare IM give by:    Date Additional Medicare IM Given:    Additional Medicare Important Message give by:     If discussed at [place] of Stay Meetings, dates discussed:    Additional Comments:  Marilu Favre, RN 07/20/2014, 2:14 PM

## 2014-07-20 NOTE — Consult Note (Signed)
Reason for Consult: Acute renal failure on chronic kidney disease stage III Referring Physician: Vernell Leep M.D. 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 Referred to the emergency room for evaluation and management of increasing colostomy output in the background of recent treatment for C. difficile colitis. He reports fair oral intake without any nausea or vomiting and does not have any other constitutional complaints such as fever or chills. He denies any chest pain or shortness of breath. Medications prior to admission are reviewed and are significant for daily Kayexalate 15 g use (unclear regarding start/stop dates). Not NSAIDs/RAS blockers. No recent iodinated intravenous contrast is noted.  In February of this year, he had dialysis dependent acute renal failure from complications of sepsis associated with cholecystitis. He was then transferred to [facility] facilities where he remained dialysis free. No labs from the interim are available on the Epic.   04/20/2014  04/27/2014  04/28/2014  07/18/2014  07/19/2014  07/20/2014   BUN 39 (H) 48 (H) 43 (H) 49 (H) 52 (H) 60 (H)  Creatinine 1.23 1.74 (H) 1.28 2.85 (H) 2.86 (H) 2.96 (H)   Concern is raised with a lack of significant improvement of his creatinine with intravenous fluids as well as the persistence of metabolic acidosis.  Past Medical History  Diagnosis Date  . Ulcerative colitis   . Diabetes mellitus without complication   . Hypertension   . Iron deficiency anemia   . Hypothyroidism   . 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 Back surgery      multiple surgeries per family  .  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 Appendectomy    . Debridement of sacral decubitus ulcer  01-26-14    No family history on file.  Social History:  reports that he does not drink alcohol or use illicit drugs. His tobacco history is not on file.  Allergies:  Allergies  Allergen Reactions  . Influenza Vaccines Anaphylaxis    ON MAR  . Other Other (See Comments)    Seasonal (sinus) allergies    Medications:  Scheduled: . amLODipine  10 mg Oral Daily  . aspirin  81 mg Oral Daily  . carvedilol  12.5 mg Oral BID WC  . doxazosin  1 mg Oral Daily  . fentaNYL  50 mcg Transdermal Q72H  . ferrous sulfate  325 mg Oral Q breakfast  . heparin  5,000 Units Subcutaneous 3 times per day  . insulin aspart  0-5 Units Subcutaneous QHS  . insulin aspart  0-9 Units Subcutaneous TID WC  . insulin glargine  20 Units Subcutaneous QHS  . levothyroxine  100 mcg Oral QAC breakfast  . mirtazapine  15 mg Oral QHS  . multivitamin with minerals  1 tablet Oral Daily  . saccharomyces boulardii  250 mg Oral BID  . tamsulosin  0.4 mg Oral QPC breakfast    BMP Latest Ref Rng 07/20/2014 07/19/2014 07/18/2014  Glucose 65 - 99 mg/dL 133(H) 166(H) 150(H)  BUN 6 - 20 mg/dL 60(H) 52(H) 49(H)  Creatinine 0.61 - 1.24 mg/dL 2.96(H) 2.86(H) 2.85(H)  Sodium 135 - 145 mmol/L 133(L) 136 135  Potassium  3.5 - 5.1 mmol/L 4.1 3.7 4.1  Chloride 101 - 111 mmol/L 108 108 105  CO2 22 - 32 mmol/L 15(L) 17(L) 20(L)  Calcium 8.9 - 10.3 mg/dL 9.7 10.5(H) 11.3(H)    CBC Latest Ref Rng 07/20/2014 07/19/2014 07/18/2014  WBC 4.0 - 10.5 K/uL 13.1(H) 15.2(H) 14.6(H)  Hemoglobin 13.0 - 17.0 g/dL 8.5(L) 9.7(L) 9.9(L)  Hematocrit 39.0 - 52.0 % 27.0(L) 31.6(L) 31.9(L)  Platelets 150 - 400 K/uL 558(H) 597(H) 723(H)      No results found.  Review of Systems  Constitutional: Positive for malaise/fatigue. Negative for fever and chills.  HENT: Negative for ear pain and nosebleeds.   Eyes: Negative.        All pertaining to  his left eye  Respiratory: Negative.   Cardiovascular: Negative.   Gastrointestinal: Negative for heartburn, nausea and blood in stool.       See history of present illness above  Genitourinary: Negative.   Musculoskeletal: Negative.   Skin: Negative.   Neurological: Positive for dizziness and weakness. Negative for headaches.  Psychiatric/Behavioral: The patient is nervous/anxious.    Blood pressure 120/56, pulse 56, temperature 98 F (36.7 C), temperature source Oral, resp. rate 16, height 6\' 2"  (1.88 m), weight 68.72 kg (151 lb 8 oz), SpO2 98 %. Physical Exam  Nursing note and vitals reviewed. Constitutional: He is oriented to person, place, and time. He appears well-developed. No distress.  Not in distress but appears uncomfortable  HENT:  Head: Normocephalic and atraumatic.  Nose: Nose normal.  Eyes: No scleral icterus.  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 No JVD present. No thyromegaly present.  Cardiovascular: Normal rate, regular rhythm and normal heart sounds.   No murmur heard. Respiratory: Effort normal and breath sounds normal. He has no wheezes. He has no rales.  GI: Soft. Bowel sounds are normal. There is no tenderness.  Colostomy in situ  Musculoskeletal: He exhibits no edema.  Neurological: He is alert and oriented to person, place, and time.  Skin: Skin is warm and dry. No rash noted. No erythema.    Assessment/Plan: 1. Acute renal failure on chronic kidney disease stage III: This appears to be primarily hemodynamically mediated although it is unclear at this time whether he has progressed with regards to his chronic kidney disease and settled at a new baseline creatinine. Looking at the input/output charted, he appears to be only 750 mL positive since admission and I will increase his fluid rate and give him isotonic sodium bicarbonate in order try and correct both his metabolic acidosis as well as his volume deficiency. Urine  analysis does not show indications of GN-will check urine electrolytes and renal ultrasound. 2. Anion gap metabolic acidosis: Secondary to acute renal failure with ongoing GI losses-treat with intravenous sodium bicarbonate drip 3. Increased colostomy output: Recently treated for C. difficile colitis-recheck negative for C. difficile PCR. Hydration. 4. History of cholecystitis status post percutaneous drainage-ongoing evaluation for possible cholecystectomy 5. Anemia: Appears to be anemia of chronic disease, check iron studies and reassess need for ESA  Terryl Niziolek K. 07/20/2014, 11:42 AM

## 2014-07-20 NOTE — Progress Notes (Signed)
[facility] Progress Note     Subjective: Pt only c/o pain at sacrum.  No abdominal pain, no N/V, in tolerating soft breakfast. Ostomy with high output, perc chole drain in place.  Objective: Vital signs in last 24 hours: Temp:  [98 F (36.7 C)-98.4 F (36.9 C)] 98 F (36.7 C) (07/04 0548) Pulse Rate:  [56-65] 56 (07/04 0548) Resp:  [16-18] 16 (07/04 0548) BP: (107-127)/(56-67) 120/56 mmHg (07/04 0548) SpO2:  [98 %-100 %] 98 % (07/04 0548) Last BM Date: 07/19/14  Intake/Output from previous day: 07/03 0701 - 07/04 0700 In: 3585.4 [P.O.:740; I.V.:2845.4] Out: 2895 [Urine:950; Drains:120; TKZSW:1093] Intake/Output this shift:    PE: Gen:  Alert, NAD, pleasant Abd: Soft, NT/ND, +BS, no HSM, RLQ ileostomy 1cm budded with high output of liquid stool and some brown soft stools, perc chole tube in RUQ was just emptied but with yellow staining to bag and eschar around tube at skin site, no signs of infection   Lab Results:   Recent Labs  07/19/14 0445 07/20/14 0331  WBC 15.2* 13.1*  HGB 9.7* 8.5*  HCT 31.6* 27.0*  PLT 597* 558*   BMET  Recent Labs  07/19/14 0445 07/20/14 0331  NA 136 133*  K 3.7 4.1  CL 108 108  CO2 17* 15*  GLUCOSE 166* 133*  BUN 52* 60*  CREATININE 2.86* 2.96*  CALCIUM 10.5* 9.7   PT/INR No results for input(s): LABPROT, INR in the last 72 hours. CMP     Component Value Date/Time   NA 133* 07/20/2014 0331   K 4.1 07/20/2014 0331   CL 108 07/20/2014 0331   CO2 15* 07/20/2014 0331   GLUCOSE 133* 07/20/2014 0331   BUN 60* 07/20/2014 0331   CREATININE 2.96* 07/20/2014 0331   CALCIUM 9.7 07/20/2014 0331   CALCIUM 8.5* 03/18/2014 0700   PROT 7.3 07/20/2014 0331   ALBUMIN 2.5* 07/20/2014 0331   AST 21 07/20/2014 0331   ALT 28 07/20/2014 0331   ALKPHOS 167* 07/20/2014 0331   BILITOT 0.3 07/20/2014 0331   GFRNONAA 20* 07/20/2014 0331   GFRAA 24* 07/20/2014 0331   Lipase     Component Value Date/Time   LIPASE 197*  03/09/2014 0900       Studies/Results: No results found.  Anti-infectives: Anti-infectives    Start     Dose/Rate Route Frequency Ordered Stop   08/17/14 1000  vancomycin (VANCOCIN) 50 mg/mL oral solution 125 mg  Status:  Discontinued     125 mg Oral Every 3 DAYS 07/19/14 0224 07/19/14 1300   08/09/14 1000  vancomycin (VANCOCIN) 50 mg/mL oral solution 125 mg  Status:  Discontinued     125 mg Oral Every other day 07/19/14 0224 07/19/14 1300   08/02/14 1000  vancomycin (VANCOCIN) 50 mg/mL oral solution 125 mg  Status:  Discontinued     125 mg Oral Daily 07/19/14 0224 07/19/14 1300   07/26/14 1000  vancomycin (VANCOCIN) 50 mg/mL oral solution 125 mg  Status:  Discontinued     125 mg Oral 2 times daily 07/19/14 0224 07/19/14 1300   07/19/14 1000  vancomycin (VANCOCIN) 50 mg/mL oral solution 125 mg  Status:  Discontinued     125 mg Oral 4 times daily 07/19/14 0224 07/19/14 1300       Assessment/Plan Prior cholecystitis/cholelithiaisis with perc chole since February 2016 Recent subtotal colectomy with ileostomy Recent c.diff abs course completed 6/27 High ileostomy output Dehydration -Apparently had no follow up since February -Order cholangiogram through [facility]  chole tube (will be done tomorrow) to see if cystic and CBD patent, if patent tube may be able to be removed.  If still obstructed likely needs drain exchanged.  Will discuss with Dr. Georgette Dover about possibility of lap chole -Rehydration, may need imodium, but hold off for now  Decubitus ulcers -per [place] RN - 100% moist, pink, beefy red    LOS: 1 day    Nat Christen 07/20/2014, 7:40 AM Pager: [phone]

## 2014-07-20 NOTE — Progress Notes (Signed)
OT Cancellation Note  Patient Details Name: Gabriel Lynch MRN: [id number] DOB: 1945-09-21   Cancelled Treatment:    Reason Eval/Treat Not Completed: Patient declined, no reason specified;Other (comment) (Pt declined due to "not feeling good".) Pt shaking his head 'no' upon therapist arrival. Therapist explained to pt benefit of mobility and therapy. Pt crying and stating, "I don't feel good. Y'all don't know what it's like." OT to reattempt as schedule permits.  Hortencia Pilar 07/20/2014, 12:08 PM

## 2014-07-21 ENCOUNTER — Inpatient Hospital Stay (HOSPITAL_COMMUNITY): Payer: Medicare Other

## 2014-07-21 DIAGNOSIS — Z932 Ileostomy status: Secondary | ICD-10-CM

## 2014-07-21 DIAGNOSIS — D638 Anemia in other chronic diseases classified elsewhere: Secondary | ICD-10-CM

## 2014-07-21 DIAGNOSIS — R197 Diarrhea, unspecified: Secondary | ICD-10-CM

## 2014-07-21 DIAGNOSIS — E876 Hypokalemia: Secondary | ICD-10-CM

## 2014-07-21 DIAGNOSIS — R198 Other specified symptoms and signs involving the digestive system and abdomen: Secondary | ICD-10-CM

## 2014-07-21 LAB — CBC
HCT: 26.8 % — ABNORMAL LOW (ref 39.0–52.0)
Hemoglobin: 8.5 g/dL — ABNORMAL LOW (ref 13.0–17.0)
MCH: 30.2 pg (ref 26.0–34.0)
MCHC: 31.7 g/dL (ref 30.0–36.0)
MCV: 95.4 fL (ref 78.0–100.0)
Platelets: 577 10*3/uL — ABNORMAL HIGH (ref 150–400)
RBC: 2.81 MIL/uL — AB (ref 4.22–5.81)
RDW: 19.6 % — AB (ref 11.5–15.5)
WBC: 15.3 10*3/uL — ABNORMAL HIGH (ref 4.0–10.5)

## 2014-07-21 LAB — RENAL FUNCTION PANEL
ALBUMIN: 2.5 g/dL — AB (ref 3.5–5.0)
Anion gap: 8 (ref 5–15)
BUN: 70 mg/dL — ABNORMAL HIGH (ref 6–20)
CALCIUM: 10 mg/dL (ref 8.9–10.3)
CO2: 21 mmol/L — ABNORMAL LOW (ref 22–32)
CREATININE: 2.63 mg/dL — AB (ref 0.61–1.24)
Chloride: 104 mmol/L (ref 101–111)
GFR calc Af Amer: 27 mL/min — ABNORMAL LOW (ref >60)
GFR, EST NON AFRICAN AMERICAN: 23 mL/min — AB (ref >60)
Glucose, Bld: 95 mg/dL (ref 65–99)
PHOSPHORUS: 1.5 mg/dL — AB (ref 2.5–4.6)
Potassium: 3.2 mmol/L — ABNORMAL LOW (ref 3.5–5.1)
Sodium: 133 mmol/L — ABNORMAL LOW (ref 135–145)

## 2014-07-21 LAB — GLUCOSE, CAPILLARY
GLUCOSE-CAPILLARY: 64 mg/dL — AB (ref 65–99)
GLUCOSE-CAPILLARY: 128 mg/dL — AB (ref 65–99)
GLUCOSE-CAPILLARY: 116 mg/dL — AB (ref 65–99)
Glucose-Capillary: 111 mg/dL — ABNORMAL HIGH (ref 65–99)
Glucose-Capillary: 109 mg/dL — ABNORMAL HIGH (ref 65–99)

## 2014-07-21 MED ORDER — POTASSIUM CHLORIDE CRYS ER 20 MEQ PO TBCR
20.0000 meq | EXTENDED_RELEASE_TABLET | Freq: Once | ORAL | Status: AC
  Administered 2014-07-21: 20 meq via ORAL
  Filled 2014-07-21: qty 1

## 2014-07-21 MED ORDER — SODIUM CHLORIDE 0.9 % IV SOLN
INTRAVENOUS | Status: DC
  Administered 2014-07-21 – 2014-07-22 (×2): via INTRAVENOUS

## 2014-07-21 MED ORDER — IOHEXOL 300 MG/ML  SOLN
50.0000 mL | Freq: Once | INTRAMUSCULAR | Status: AC | PRN
  Administered 2014-07-21: 10 mL via INTRAVENOUS

## 2014-07-21 MED ORDER — INSULIN GLARGINE 100 UNIT/ML ~~LOC~~ SOLN
15.0000 [IU] | Freq: Every day | SUBCUTANEOUS | Status: DC
  Administered 2014-07-21 – 2014-07-23 (×3): 15 [IU] via SUBCUTANEOUS
  Filled 2014-07-21 (×4): qty 0.15

## 2014-07-21 MED ORDER — CHLORHEXIDINE GLUCONATE CLOTH 2 % EX PADS
6.0000 | MEDICATED_PAD | Freq: Every day | CUTANEOUS | Status: DC
Start: 2014-07-21 — End: 2014-07-24
  Administered 2014-07-21 – 2014-07-24 (×4): 6 via TOPICAL

## 2014-07-21 MED ORDER — PSYLLIUM 95 % PO PACK
1.0000 | PACK | Freq: Two times a day (BID) | ORAL | Status: DC
  Administered 2014-07-21: 1 via ORAL
  Filled 2014-07-21 (×2): qty 1

## 2014-07-21 MED ORDER — SODIUM CHLORIDE 0.9 % IV SOLN
INTRAVENOUS | Status: AC
  Administered 2014-07-21 (×2): via INTRAVENOUS

## 2014-07-21 MED ORDER — MUPIROCIN 2 % EX OINT
1.0000 "application " | TOPICAL_OINTMENT | Freq: Two times a day (BID) | CUTANEOUS | Status: DC
  Administered 2014-07-21 – 2014-07-24 (×7): 1 via NASAL
  Filled 2014-07-21 (×2): qty 22

## 2014-07-21 MED ORDER — CHOLESTYRAMINE 4 G PO PACK
4.0000 g | PACK | Freq: Two times a day (BID) | ORAL | Status: DC
  Administered 2014-07-21 – 2014-07-24 (×7): 4 g
  Filled 2014-07-21 (×9): qty 1

## 2014-07-21 NOTE — Progress Notes (Signed)
Occupational Therapy Evaluation Patient Details Name: Gabriel Lynch MRN: [id number] DOB: 1945/01/22 Today's Date: 07/21/2014    History of Present Illness Pt is a 69 y.o. male with a complex medical history who was sent from the [facility] SNF due to problems with Urinary retention, and worsening of his wounds along with increased output from his colostomy. Of Note patient had A history of Sepsis and Septic Shock with Acute Renal Failure requiring Short Term Dialysis due to complications of Cholecystitis and had been in ICU, then transferred to [facility], then to [facility] for [place], and then to [facility] towards the last week of June 2016.   Clinical Impression   Pt admitted with the above diagnoses and presents with below problem list. Pt will benefit from continued OT to address the below listed deficits and maximize independence with BADLs prior to d/c to venue below. PTA pt reports functional mobility with rw at min guard level, setup level for UB bathing/dressing and max A for LB ADLs. Limited evaluation with pt declining coming to EOB position due to increased pain and "wooziness" with movement. Pt presents as mod A for UB ADLs and max - total assist with LB ADLs at bed level. Educated pt at length about benefits of mobility and participating in therapy as well as risks of immobility. Pt stating "I'll move around when I'm feeling better." Therapist acknowledged pt concerns and emotions. Offered referral to chaplain services to address emotional/spiritual needs with pt declining, "I've got people to talk to." OT to continue to follow acutely.      Follow Up Recommendations  SNF    Equipment Recommendations  Other (comment) (defer to next venue)    Recommendations for Other Services       Precautions / Restrictions Restrictions Weight Bearing Restrictions: No      Mobility Bed Mobility Overal bed mobility: Needs Assistance Bed  Mobility: Rolling Rolling: Min assist         General bed mobility comments: Used bed rails. Min A for powering up to side with cues for technique.  Transfers Overall transfer level:  (declined EOB, transfers. )               General transfer comment: declined EOB, transfers    Balance                                            ADL Overall ADL's : Needs assistance/impaired Eating/Feeding: Set up;Bed level   Grooming: Set up;Bed level   Upper Body Bathing: Moderate assistance;Bed level   Lower Body Bathing: Maximal assistance;Bed level   Upper Body Dressing : Set up;Bed level   Lower Body Dressing: Total assistance;Bed level                 General ADL Comments: Pt completed rolling to each side with min A to facilitate bathing and bed linen change. Pt declined sitting EOB due to increased pain/"wozziness" "when I lift my head up." Educated pt at length about benefits of mobility and participating in therapy as well as risks of immobility. Pt stating "I'll move around when I'm feeling better." Therapist acknowledged pt concerns and emotions. Offered referral to chaplain services to address emotional/spiritual needs with pt declining, "I've got people to talk to."     [place]  Pertinent Vitals/Pain Pain Assessment: Faces Faces Pain Scale: Hurts whole lot Pain Location: sacrum Pain Descriptors / Indicators: Grimacing;Constant Pain Intervention(s): Limited activity within patient's tolerance;Monitored during session;Repositioned     Hand Dominance     Extremity/Trunk Assessment Upper Extremity Assessment Upper Extremity Assessment: Generalized weakness   Lower Extremity Assessment Lower Extremity Assessment: Defer to PT evaluation;Generalized weakness       Communication Communication Communication: No difficulties   Cognition Arousal/Alertness: Awake/alert Behavior During Therapy:  Agitated;Anxious Overall Cognitive Status: Difficult to assess                     General Comments       Exercises       Shoulder Instructions      Home Living Family/patient expects to be discharged to:: Skilled nursing facility                                        Prior Functioning/Environment Level of Independence: Needs assistance  Gait / Transfers Assistance Needed: Pt reports walking halls at SNF a week ago (end of June 2016) with min guard assist using a walker. ADL's / Homemaking Assistance Needed: Per pt report he was setup level for UB bathing/dressing and completed LB dressing at max A level at bed level. Pt reports using a urinal for toileting.         OT Diagnosis: Generalized weakness;Acute pain   OT Problem List: Decreased strength;Decreased activity tolerance;Impaired balance (sitting and/or standing);Impaired vision/perception;Decreased coordination;Decreased safety awareness;Decreased knowledge of use of DME or AE;Decreased knowledge of precautions;Pain   OT Treatment/Interventions: Self-care/ADL training;Therapeutic exercise;Energy conservation;DME and/or AE instruction;Therapeutic activities;Patient/family education;Balance training    OT Goals(Current goals can be found in the care plan section) Acute Rehab OT Goals Patient Stated Goal: "I just want to get back home." OT Goal Formulation: With patient Time For Goal Achievement: 08/04/14 Potential to Achieve Goals: Good ADL Goals Pt Will Perform Grooming: with min guard assist;sitting Pt Will Perform Upper Body Bathing: with set-up;sitting;bed level Pt Will Perform Upper Body Dressing: with set-up;sitting;bed level Pt/caregiver will Perform Home Exercise Program: Increased strength;Both right and left upper extremity;With theraband;With theraputty;Independently;With written HEP provided Additional ADL Goal #1: Pt will completing rolling to either side at mod I level to prepare  for OOB/EOB ADLs.   OT Frequency: Min 2X/week   Barriers to D/C:            Co-evaluation              End of Session    Activity Tolerance: Patient limited by pain;Other (comment) ("wooziness" with head raises per pt report) Patient left: in bed;with call bell/phone within reach   Time: 0943-1004 OT Time Calculation (min): 21 min Charges:  OT General Charges $OT Visit: 1 Procedure OT Evaluation $Initial OT Evaluation Tier I: 1 Procedure G-Codes:    Hortencia Pilar 07/23/2014, 10:44 AM

## 2014-07-21 NOTE — Procedures (Signed)
Cholangiogram via existing chole tube demonstrates wide patency of cystic duct and CBD to level of duodenum. Chole tube capped for trial of internalization. Pt is to return to [facility] drain next next Tuesday (7/12) for repeat cholangiogram and evaluation for potential chole tube removal.

## 2014-07-21 NOTE — Progress Notes (Addendum)
Noted a pus looking discharge coming from patients rectum. MD made aware. Surgical team came and check  patient.

## 2014-07-21 NOTE — Progress Notes (Signed)
[facility] Progress Note     Subjective: Having lots of diarrhea out colostomy bag 3L.  No N/V.  Had IR cholangiogram today and it was patent.  Pt says IR will cap it off and then remove it next week if still okay.  Not very hungry th is am.    Objective: Vital signs in last 24 hours: Temp:  [97.8 F (36.6 C)-98.9 F (37.2 C)] 98.9 F (37.2 C) (07/05 0508) Pulse Rate:  [59-66] 59 (07/05 0508) Resp:  [16-19] 19 (07/05 0508) BP: (93-121)/(54-65) 105/61 mmHg (07/05 0508) SpO2:  [97 %-100 %] 100 % (07/05 0508) Last BM Date: 07/20/14  Intake/Output from previous day: 07/04 0701 - 07/05 0700 In: 4614.5 [P.O.:818; I.V.:3796.5] Out: 4098 [Urine:1550; JXBJY:7829] Intake/Output this shift:    PE: Gen:  Alert, NAD, pleasant Abd: Soft, NT/ND, +BS, perc chole tube capped, ileostomy pink with lows of loose watery green stools  Lab Results:   Recent Labs  07/20/14 0331 07/21/14 0430  WBC 13.1* 15.3*  HGB 8.5* 8.5*  HCT 27.0* 26.8*  PLT 558* 577*   BMET  Recent Labs  07/20/14 0331 07/21/14 0430  NA 133* 133*  K 4.1 3.2*  CL 108 104  CO2 15* 21*  GLUCOSE 133* 95  BUN 60* 70*  CREATININE 2.96* 2.63*  CALCIUM 9.7 10.0   PT/INR No results for input(s): LABPROT, INR in the last 72 hours. CMP     Component Value Date/Time   NA 133* 07/21/2014 0430   K 3.2* 07/21/2014 0430   CL 104 07/21/2014 0430   CO2 21* 07/21/2014 0430   GLUCOSE 95 07/21/2014 0430   BUN 70* 07/21/2014 0430   CREATININE 2.63* 07/21/2014 0430   CALCIUM 10.0 07/21/2014 0430   CALCIUM 8.5* 03/18/2014 0700   PROT 7.3 07/20/2014 0331   ALBUMIN 2.5* 07/21/2014 0430   AST 21 07/20/2014 0331   ALT 28 07/20/2014 0331   ALKPHOS 167* 07/20/2014 0331   BILITOT 0.3 07/20/2014 0331   GFRNONAA 23* 07/21/2014 0430   GFRAA 27* 07/21/2014 0430   Lipase     Component Value Date/Time   LIPASE 197* 03/09/2014 0900       Studies/Results: US Renal  07/20/2014   CLINICAL DATA:  Acute renal  failure  EXAM: RENAL / URINARY TRACT ULTRASOUND COMPLETE  COMPARISON:  CT 03/06/2014  FINDINGS: Right Kidney:  Length: 12.4 cm. Increased echotexture throughout the right kidney. 2.2 cm lower pole cyst. No hydronephrosis.  Left Kidney:  Length: 11.9 cm. Increased echotexture throughout the left Kidney. No focal abnormality. No hydronephrosis.  Bladder:  Appears normal for degree of bladder distention.  Incidentally noted are gallstones within the gallbladder.  IMPRESSION: Increased echotexture within the kidneys compatible with chronic medical renal disease. No hydronephrosis.  Cholelithiasis.   Electronically Signed   By: Rolm Baptise M.D.   On: 07/20/2014 14:20    Anti-infectives: Anti-infectives    Start     Dose/Rate Route Frequency Ordered Stop   08/17/14 1000  vancomycin (VANCOCIN) 50 mg/mL oral solution 125 mg  Status:  Discontinued     125 mg Oral Every 3 DAYS 07/19/14 0224 07/19/14 1300   08/09/14 1000  vancomycin (VANCOCIN) 50 mg/mL oral solution 125 mg  Status:  Discontinued     125 mg Oral Every other day 07/19/14 0224 07/19/14 1300   08/02/14 1000  vancomycin (VANCOCIN) 50 mg/mL oral solution 125 mg  Status:  Discontinued     125 mg Oral Daily 07/19/14 0224 07/19/14  1300   07/26/14 1000  vancomycin (VANCOCIN) 50 mg/mL oral solution 125 mg  Status:  Discontinued     125 mg Oral 2 times daily 07/19/14 0224 07/19/14 1300   07/19/14 1000  vancomycin (VANCOCIN) 50 mg/mL oral solution 125 mg  Status:  Discontinued     125 mg Oral 4 times daily 07/19/14 0224 07/19/14 1300       Assessment/Plan Prior cholecystitis/cholelithiaisis with perc chole since February 2016 -Apparently had no follow up since February -Cholangiogram was patent, clamp today and in 1 week if tolerating clamp, IR will repeat cholangiogram and take out the tube.  If he doesn't do well with it clamped may need cholecystectomy vs continuing drain. Recent subtotal colectomy with ileostomy Recent c.diff abs course  completed 6/27 High ileostomy output/Dehydration -?post c.diff colitis -Maximize imodium, try metamucil.  Can consider down the line tincture of opium, paragoric, sandostatin.  GI may need to be consulted if continued high output  Decubitus ulcers -per WOC RN - 100% moist, pink, beefy red    LOS: 2 days    Nat Christen 07/21/2014, 8:48 AM Pager: [phone]

## 2014-07-21 NOTE — Progress Notes (Addendum)
Patient ID: Gabriel Lynch, male   DOB: 02/13/1945, 69 y.o.   MRN: [id number]  [facility] Progress Note    Assessment/ Plan:   1. Acute renal failure on chronic kidney disease stage III: Appears to have been hemodynamically mediated acute on chronic renal failure-possible evolution to ATN (urine sodium <10, FeNa <1%). We'll continue with intravenous fluids and switch to normal saline at this time from isotonic sodium bicarbonate. He remains net negative given with intravenous fluids and I will increase rate to 200 mL/h for 10 hours and then 1 50 mL thereafter 2. Anion gap metabolic acidosis: Secondary to acute renal failure with ongoing GI losses-improve with intravenous sodium bicarbonate, switch to normal saline at this time. 3. Increased colostomy output: Recently treated for C. difficile colitis-recheck negative for C. difficile PCR. Hydration and agree with trial of Imodium. 4. History of cholecystitis status post percutaneous drainage-ongoing evaluation for possible cholecystectomy 5. Anemia: Appears to be anemia of chronic disease, check iron studies and reassess need for ESA 6. Hypokalemia: Secondary to gastrointestinal losses as well as intracellular shifts with isotonic sodium bicarbonate-replaced via oral route  Subjective:   Reports to be feeling better today-watching tennis    Objective:   BP 105/61 mmHg  Pulse 59  Temp(Src) 98.9 F (37.2 C) (Oral)  Resp 19  Ht 6\' 2"  (1.88 m)  Wt 68.72 kg (151 lb 8 oz)  BMI 19.44 kg/m2  SpO2 100%  Intake/Output Summary (Last 24 hours) at 07/21/14 1012 Last data filed at 07/21/14 0609  Gross per 24 hour  Intake 4258.5 ml  Output   4375 ml  Net -116.5 ml   Weight change:   Physical Exam: Gen: Comfortably resting in bed CVS: Pulse regular in rate and rhythm, S1 and S2 normal Resp: Clear to auscultation, no rales Abd: Soft, flat, nontender, colostomy in situ Ext: No lower extremity edema  Imaging: US  Renal  07/20/2014   CLINICAL DATA:  Acute renal failure  EXAM: RENAL / URINARY TRACT ULTRASOUND COMPLETE  COMPARISON:  CT 03/06/2014  FINDINGS: Right Kidney:  Length: 12.4 cm. Increased echotexture throughout the right kidney. 2.2 cm lower pole cyst. No hydronephrosis.  Left Kidney:  Length: 11.9 cm. Increased echotexture throughout the left Kidney. No focal abnormality. No hydronephrosis.  Bladder:  Appears normal for degree of bladder distention.  Incidentally noted are gallstones within the gallbladder.  IMPRESSION: Increased echotexture within the kidneys compatible with chronic medical renal disease. No hydronephrosis.  Cholelithiasis.   Electronically Signed   By: Rolm Baptise M.D.   On: 07/20/2014 14:20    Labs: BMET  Recent Labs Lab 07/18/14 2218 07/19/14 0445 07/20/14 0331 07/21/14 0430  NA 135 136 133* 133*  K 4.1 3.7 4.1 3.2*  CL 105 108 108 104  CO2 20* 17* 15* 21*  GLUCOSE 150* 166* 133* 95  BUN 49* 52* 60* 70*  CREATININE 2.85* 2.86* 2.96* 2.63*  CALCIUM 11.3* 10.5* 9.7 10.0  PHOS  --   --   --  1.5*   CBC  Recent Labs Lab 07/18/14 2218 07/19/14 0445 07/20/14 0331 07/21/14 0430  WBC 14.6* 15.2* 13.1* 15.3*  NEUTROABS 8.7*  --   --   --   HGB 9.9* 9.7* 8.5* 8.5*  HCT 31.9* 31.6* 27.0* 26.8*  MCV 95.5 94.6 96.8 95.4  PLT 723* 597* 558* 577*    Medications:    . amLODipine  10 mg Oral Daily  . aspirin  81 mg Oral Daily  . carvedilol  12.5 mg Oral BID WC  . Chlorhexidine Gluconate Cloth  6 each Topical Q0600  . doxazosin  1 mg Oral Daily  . fentaNYL  50 mcg Transdermal Q72H  . ferrous sulfate  325 mg Oral Q breakfast  . heparin  5,000 Units Subcutaneous 3 times per day  . insulin aspart  0-5 Units Subcutaneous QHS  . insulin aspart  0-9 Units Subcutaneous TID WC  . insulin glargine  20 Units Subcutaneous QHS  . levothyroxine  100 mcg Oral QAC breakfast  . mirtazapine  15 mg Oral QHS  . multivitamin with minerals  1 tablet Oral Daily  . mupirocin  ointment  1 application Nasal BID  . psyllium  1 packet Oral BID  . saccharomyces boulardii  250 mg Oral BID  . tamsulosin  0.4 mg Oral QPC breakfast   Elmarie Shiley, MD 07/21/2014, 10:12 AM

## 2014-07-21 NOTE — Consult Note (Signed)
[facility] Consult: 11:37 AM 07/21/2014  LOS: 2 days    Referring Provider: Dr Algis Liming.   Primary Care Physician:  [name], MD in [facility] Primary Gastroenterologist:  Althia Forts.  Was seeing a GI in [place] until 12/2013.      Reason for Consultation:  diarrhea   HPI: Gabriel Lynch is a 69 y.o. male.   Hx Hypothyroidism.  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 Hx UC since ~ 2009.   s/p subtotal colectomy/ileostomy in [place], [place] 01/22/2014.  Marland Kitchen  Also underwent surgical debridement and wound vac of sacral decubitus.  Reqired indwelling foley. Discharged from surgical admission to SNF in [place].  Admission 03/06/14 - 03/10/14 to [facility] with sepsis, acute cholecystitis, lipase of 197,  AKI, requiring dialysis and vent support.   Underwent perc cholecystostomy 2/19.   Transfused 1 PRBC for anemia.  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  On no UC meds but prior to Colectomy was taking Lialda. 3/16 underwent removal of temporary HD catheter. At some point the foley was discontinued. Treated at [facility] for C diff with course of oral vancomycin, finished 6/27.  Pt says he did not have increased frequency or watery stools associated with the C diff.  Released from [facility] to [facility] ~ 6/27.  Meds at transfer included Florastor, daily Kayexalate, Prilosec.   Transfer to ED 7/2 with urinary retention, AKI, hypokalemia, increased stool volume of watery stool.  Empirically started on oral vanc but now [facility] when C diff returned negative.  Pt says the stools became watery and larger volume about one week ago.  No abdominal pain, no anorexia or nausea.  No bloody stool.  Previously stools soft, liquid but had a lot of substance to them.  Since starting on BID  Imodium yesterday, stools still watery but have more fecal matter c/w previous stools. Cholangiogram today demonstrates wide patency of cystic duct and CBD to level of duodenum.  Chole tube capped for trial of internalization.  Plan is for repeat c-gram and possible tube removal 7/12. Pt not participating in PT/OT "I don't feel good".     Past Medical History  Diagnosis Date  . Ulcerative colitis   . Diabetes mellitus without complication   . Hypertension   . Iron deficiency anemia   . Hypothyroidism   . 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 Back surgery      multiple surgeries per family  . 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 Appendectomy    . Debridement of sacral decubitus ulcer  01-26-14    Prior to Admission medications   Medication Sig Start Date End Date Taking? Authorizing Provider  Amino Acids-Protein Hydrolys (FEEDING SUPPLEMENT, PRO-STAT SUGAR FREE 64,) LIQD Take 30 mLs by mouth 2 (two) times daily.   Yes Historical Provider, MD  amLODipine (NORVASC) 10 MG tablet Take 10 mg by mouth daily.  Yes Historical Provider, MD  aspirin 81 MG tablet Take 81 mg by mouth daily.   Yes Historical Provider, MD  calcium acetate (PHOSLO) 667 MG capsule Take 667 mg by mouth daily.   Yes Historical Provider, MD  carvedilol (COREG) 12.5 MG tablet Take 12.5 mg by mouth 2 (two) times daily with a meal.   Yes Historical Provider, MD  doxazosin (CARDURA) 1 MG tablet Take 1 mg by mouth daily.   Yes Historical Provider, MD  fentaNYL (DURAGESIC - DOSED MCG/HR) 50 MCG/HR Place 50 mcg onto the skin every 3 (three) days.   Yes Historical Provider, MD  ferrous sulfate 325 (65 FE) MG tablet Take 325 mg by mouth daily with breakfast.   Yes Historical Provider, MD  HYDROmorphone (DILAUDID) 2 MG tablet Take 2 mg by mouth every 6 (six) hours as needed for severe pain.   Yes Historical Provider, MD  insulin glargine  (LANTUS) 100 UNIT/ML injection Inject 20 Units into the skin every morning.    Yes Historical Provider, MD  insulin lispro (HUMALOG) 100 UNIT/ML injection Inject 0-15 Units into the skin 3 (three) times daily before meals. Sliding scale   Yes Historical Provider, MD  levothyroxine (SYNTHROID, LEVOTHROID) 100 MCG tablet Take 100 mcg by mouth daily before breakfast.   Yes Historical Provider, MD  mirtazapine (REMERON) 15 MG tablet Take 15 mg by mouth at bedtime.   Yes Historical Provider, MD  Multiple Vitamins-Minerals (MULTIVITAMIN WITH MINERALS) tablet Take 1 tablet by mouth daily.   Yes Historical Provider, MD  omeprazole (PRILOSEC) 20 MG capsule Take 20 mg by mouth daily.   Yes Historical Provider, MD  ondansetron (ZOFRAN) 4 MG tablet Take 4 mg by mouth every 4 (four) hours as needed for nausea or vomiting.   Yes Historical Provider, MD  OVER THE COUNTER MEDICATION Take 120 mLs by mouth 2 (two) times daily. Med pass   Yes Historical Provider, MD  Probiotic Product (PROBIOTIC DAILY) CAPS Take 1 capsule by mouth daily.   Yes Historical Provider, MD  sodium polystyrene (KAYEXALATE) 15 GM/60ML suspension Take 15 g by mouth every morning.   Yes Historical Provider, MD  tamsulosin (FLOMAX) 0.4 MG CAPS capsule Take 0.4 mg by mouth every other day.    Yes Historical Provider, MD    Scheduled Meds: . amLODipine  10 mg Oral Daily  . aspirin  81 mg Oral Daily  . carvedilol  12.5 mg Oral BID WC  . Chlorhexidine Gluconate Cloth  6 each Topical Q0600  . doxazosin  1 mg Oral Daily  . fentaNYL  50 mcg Transdermal Q72H  . ferrous sulfate  325 mg Oral Q breakfast  . heparin  5,000 Units Subcutaneous 3 times per day  . insulin aspart  0-5 Units Subcutaneous QHS  . insulin aspart  0-9 Units Subcutaneous TID WC  . insulin glargine  20 Units Subcutaneous QHS  . levothyroxine  100 mcg Oral QAC breakfast  . mirtazapine  15 mg Oral QHS  . multivitamin with minerals  1 tablet Oral Daily  . mupirocin ointment  1  application Nasal BID  . psyllium  1 packet Oral BID  . saccharomyces boulardii  250 mg Oral BID  . tamsulosin  0.4 mg Oral QPC breakfast   Infusions: . sodium chloride 200 mL/hr at 07/21/14 1021   Followed by  . sodium chloride     PRN Meds: acetaminophen **OR** acetaminophen, alum & mag hydroxide-simeth, HYDROmorphone (DILAUDID) injection, ondansetron **OR** ondansetron (ZOFRAN) IV, oxyCODONE  Allergies as of 07/18/2014 - Review Complete 07/18/2014  Allergen Reaction Noted  . Influenza vaccines Other (See Comments) 03/06/2014    No family history on file.  History   Social History  . Marital Status: Unknown    Spouse Name: N/A  . Number of Children: N/A  . Years of Education: N/A   Occupational History  . Not on file.   Social History Main Topics  . Smoking status: Unknown If Ever Smoked  . Smokeless tobacco: Not on file  . Alcohol Use: No  . Drug Use: No  . Sexual Activity: Not on file   Other Topics Concern  . Not on file   Social History Narrative    REVIEW OF SYSTEMS: Constitutional:  Tired, generally weak.  ENT:  No nose bleeds Pulm:  No SOB or cough CV:  No palpitations, no LE edema. No chest pain, no leg pain or claudication sxs. GU:  No hematuria, no frequency GI:  Per HPI.  No dyspepsia, no dysphagia Heme:  No unusual bleeding or bruising   Transfusions:  Per HPI.  Neuro:  No headaches, no peripheral tingling or numbness Derm:  No itching, no rash or sores.  Endocrine:  No sweats or chills.  No polyuria or dysuria Immunization:  Not queried.  Travel: none beyond state on [place] in > one year.    PHYSICAL EXAM: Vital signs in last 24 hours: Filed Vitals:   07/21/14 0508  BP: 105/61  Pulse: 59  Temp: 98.9 F (37.2 C)  Resp: 19   Wt Readings from Last 3 Encounters:  07/19/14 151 lb 8 oz (68.72 kg)  03/10/14 145 lb 11.2 oz (66.089 kg)    General: somewhat chronically, but not acutely, ill looking.  Comfortable.  Alert. Head:  No  swelling or assymetry.  Eyes:  No icterus or pallor.  Prosthetic right eye, lid lag on that same side Ears:  Not HOH  Nose:  No discharge or congestion Mouth:  Clear, moist MM.  Neck:  No JVD, masses or TMG Lungs:  Clear bil .  No cough or labored breathing Heart: RRR.  No mrg.  S1/s2 audible Abdomen:  Soft, NT, ND.  Ileostomy on right with greenish watery stool, lots of fecal material in suspension.   Rectal: deferred .  Did not examine sacrum.  C/o pain when I raised the head of bed, putting increased pressure on sacrum  Musc/Skeltl: no deformities, redness or swelling of joints Extremities:  No CCE  Neurologic:  Oriented x 3.  Appropriate.  Moves all 4s.  No tremor Skin:  No telangectasia, rash or sores on extremities Tattoos:  none Nodes:  No cervical adenopathy.    Psych:  Cooperative, a bit short tempered.  No anxious  Intake/Output from previous day: 07/04 0701 - 07/05 0700 In: 4614.5 [P.O.:818; I.V.:3796.5] Out: 1610 [Urine:1550; RUEAV:4098] Intake/Output this shift:    LAB RESULTS:  Recent Labs  07/19/14 0445 07/20/14 0331 07/21/14 0430  WBC 15.2* 13.1* 15.3*  HGB 9.7* 8.5* 8.5*  HCT 31.6* 27.0* 26.8*  PLT 597* 558* 577*   BMET Lab Results  Component Value Date   NA 133* 07/21/2014   NA 133* 07/20/2014   NA 136 07/19/2014   K 3.2* 07/21/2014   K 4.1 07/20/2014   K 3.7 07/19/2014   CL 104 07/21/2014   CL 108 07/20/2014   CL 108 07/19/2014   CO2 21* 07/21/2014   CO2 15* 07/20/2014   CO2 17* 07/19/2014   GLUCOSE 95  07/21/2014   GLUCOSE 133* 07/20/2014   GLUCOSE 166* 07/19/2014   BUN 70* 07/21/2014   BUN 60* 07/20/2014   BUN 52* 07/19/2014   CREATININE 2.63* 07/21/2014   CREATININE 2.96* 07/20/2014   CREATININE 2.86* 07/19/2014   CALCIUM 10.0 07/21/2014   CALCIUM 9.7 07/20/2014   CALCIUM 10.5* 07/19/2014   LFT  Recent Labs  07/20/14 0331 07/21/14 0430  PROT 7.3  --   ALBUMIN 2.5* 2.5*  AST 21  --   ALT 28  --   ALKPHOS 167*  --     BILITOT 0.3  --    PT/INR Lab Results  Component Value Date   INR 1.19 04/19/2014   INR 1.54* 03/06/2014   Hepatitis Panel No results for input(s): HEPBSAG, HCVAB, HEPAIGM, HEPBIGM in the last 72 hours. C-Diff 07/18/14 PCR negative.   Lipase     Component Value Date/Time   LIPASE 197* 03/09/2014 0900       RADIOLOGY STUDIES: Ir Sinus/fist Tube Chk-non Gi  07/21/2014   CLINICAL DATA:  History of acute pectus cholecystitis, post ultrasound guided cholecystostomy tube placement on 03/06/2014. Patient with complex medical history including recent subtotal colectomy with ileostomy as well as recent diagnosis of C diff.  Please perform fluoroscopic guided cholangiogram via existing cholecystostomy tube to evaluate for patency of the cystic and common bile ducts.  EXAM: SINUS TRACT INJECTION/FISTULOGRAM  COMPARISON:  Ultrasound guided cholecystostomy tube placement - 03/06/2014; CT abdomen pelvis -03/06/2014  CONTRAST:  32mL OMNIPAQUE IOHEXOL 300 MG/ML  SOLN  FLUOROSCOPY TIME:  dictate in minutes & seconds  TECHNIQUE: Patient was positioned supine on the fluoroscopy table. Preprocedural spot fluoroscopic image was obtained of the right upper abdominal quadrant existing cholecystostomy tube.  Multiple fluoroscopic and radiographic images were obtained in various obliquities following the injection of a small amount of contrast via the existing cholecystostomy tube.  Images were reviewed and the cholecystostomy tube was flushed with a small amount of saline and capped.  FINDINGS: Preprocedural spot fluoroscopic image demonstrates unchanged positioning of the cholecystostomy tube with end coiled and locked overlying expected location of the gallbladder fossa.  Contrast injection demonstrates appropriate positioning and functioning of the existing cholecystostomy tube. There is brisk passage of contrast through the gallbladder with opacification of the cystic and common bile ducts. There is brisk passage  of contrast through the biliary system to the level of the duodenum.  There is a persistent ill-defined filling defect within the gallbladder fundus which correlates with the gallstones seen on remote abdominal CT obtained 03/06/2014. There no discrete filling defects in the opacified portions of the biliary tree to suggest choledocholithiasis.  IMPRESSION: 1. Appropriately positioned and functioning cholecystostomy tube. No exchange performed. 2. Cholelithiasis without evidence of choledocholithiasis.  PLAN: The patient's cholecystostomy tube was capped for a trial of internalization.  Patient will return to the [facility] next Tuesday (7/12) for repeat percutaneous cholangiogram as well as evaluation for potential cholecystostomy tube removal.   Electronically Signed   By: Sandi Mariscal M.D.   On: 07/21/2014 11:17   US Renal  07/20/2014   CLINICAL DATA:  Acute renal failure  EXAM: RENAL / URINARY TRACT ULTRASOUND COMPLETE  COMPARISON:  CT 03/06/2014  FINDINGS: Right Kidney:  Length: 12.4 cm. Increased echotexture throughout the right kidney. 2.2 cm lower pole cyst. No hydronephrosis.  Left Kidney:  Length: 11.9 cm. Increased echotexture throughout the left Kidney. No focal abnormality. No hydronephrosis.  Bladder:  Appears normal for degree of  bladder distention.  Incidentally noted are gallstones within the gallbladder.  IMPRESSION: Increased echotexture within the kidneys compatible with chronic medical renal disease. No hydronephrosis.  Cholelithiasis.   Electronically Signed   By: Rolm Baptise M.D.   On: 07/20/2014 14:20    ENDOSCOPIC STUDIES: 07/2013 Colonoscopy at [facility].  Dr Arlyss Queen.  "Inflammation characterized by altered vascularity, erythema, friability  and pseudopolyps was found in a continuous and circumferential pattern  in the sigmoid colon. This was mild in severity. Biopsies were taken  with a cold forceps for histology.  Areas of scarring were  also noted at the HF. Multiple small and large-mouthed diverticula were found in the sigmoid  colon and in the descending colon. The exam was otherwise without abnormality" No corresponding path report found in [place].  Recommendation: - Use Apriso 0.375 g 4 caps PO QAM indefinitely.   IMPRESSION:   *  Hx UC for ~ 7 years.  S/p  Subtotal colectomy and ileostomy Jan 2016 in [place], [place].  No UC meds since then.   C diff treated with course of Vanco, completed 07/12/14. Worsening diarrhea but repeat c diff PCR negative.   *  Acute cholecystitis, s/p perc biliary drain 02/25/14.  Cholangiogram today with patent cystic duct, tube clamped.    *  AKI, CKD.    *  Sacral decubitus with osteomyelitis. Finished prolonged course abx after 02/2014 discharge.   *  Normocytic anemia, anemia of chronic disease.  On daily oral iron.  Hgb drop from ~ 12.5 to 8.5 to 9.5 since 04/2014. Required PRBC for Hgb 6.6 on 03/18/14.   *  ASPVD.   Bil LE stenting 07/2013.    PLAN:     *  Will stop the metamucil just ordered today and start Questran.  *  Check TSH in AM.   Azucena Freed  07/21/2014, 11:37 AM Pager: [phone]  GI ATTENDING  History, laboratories, x-rays reviewed. Patient seen and examined. Agree with comprehensive H&P as outlined above. Very complicated patient as noted. Asked to see regarding possible high output diarrhea from colostomy. No evidence for C. difficile. Agree with anti-diarrheal agent and addition of Questran. Monitor output. IV hydration to replace losses as indicated.  Docia Chuck. Geri Seminole., M.D. [facility] Division of Gastroenterology

## 2014-07-21 NOTE — Progress Notes (Signed)
PT Cancellation Note  Patient Details Name: Gabriel Lynch MRN: [id number] DOB: 1945-08-19   Cancelled Treatment:    Reason Eval/Treat Not Completed: Patient declined, no reason specified Spent 10 minutes educating pt about importance of mobility and detrimental affects of bedrest however pt became emotional and continued to decline participating in therapy services. "I just don't feel good."  When therapist asked if she should return tomorrow, pt stated, "I don't know, depends on how I am feeling."  Will follow up 1 additional time to perform PT evaluation.   Marguarite Arbour A Darbi Chandran 07/21/2014, 11:20 AM Wray Kearns, PT, DPT [phone]

## 2014-07-21 NOTE — Progress Notes (Signed)
PROGRESS NOTE    Gabriel Lynch BDZ:[id number] DOB: 1945-05-15 DOA: 07/18/2014 PCP: [name], MD  HPI/Brief narrative 69 y.o. male with a complex medical history who was sent from the [facility] SNF due to problems with Urinary retention, and worsening of his wounds along with increased output from his colostomy. He recently completed a course of Oral Vancomycin for C.Diff Colitison 06/27, and shortly thereafter he has had increased watery stool for the past 3-4 days. He denies any nausea or vomiting or and fever or chills.   Of Note patient hada history of Sepsis and Septic Shock with Acute Renal Failure requiring Short Term Dialysis due to complications of Cholecystitis and had been in ICU, then transferred to [facility], then to [facility] for [facility], and then to [facility] 1 week ago.  Admitted for diarrhea, repeat C. difficile PCR negative, dehydration, acute on chronic kidney disease. General surgery consulted for recommendations regarding percutaneous cholecystostomy. Nephrology following regarding acute on chronic renal failure. GI consulted regarding ongoing increased ostomy output.   Assessment/Plan:  Diarrhea/recently treated C. difficile colitis - Recently completed course of vancomycin for C. difficile colitis on 07/13/14-exact duration of treatment not known. - C. difficile PCR negative - ? Acute GE versus IBS from recent C. Difficile - Discontinue vancomycin. Continue probiotics - Follow stool culture results - Persisting diarrhea. Trial of Imodium. - GI consulted and have started Questran  Dehydration - Likely due to GI losses - Mild hypercalcemia possibly related to same - Continue IV fluid hydration: Changed to bicarbonate drip by nephrology.  Acute on stage III chronic kidney disease/ NAG metabolic acidosis - Related to GI losses, hemodynamically mediated, possible evolution to ATN - Creatinine on 04/28/14 was 1.28.  Admitted with creatinine of 2.85 - Creatinine increased to 2.9 despite IV fluid hydration. Likely still quite volume depleted. - Nephrology consulted and have increased IV fluids and briefly treated with bicarbonate drip for NAG metabolic acidosis - Renal ultrasound shows chronic medical renal disease without hydronephrosis. - Creatinine improving. Fluids changed to normal saline at increased rate.  Urinary retention - Coud Foley placed in ED - Did not have Foley catheter prior to admission but had one placed remotely per patient - Continue tamsulosin - May need to discharge on fully catheter with outpatient consultation with urology  Multiple wounds-stage IV sacral pressure ulcer/sacral osteomyelitis, healed skin tears/avulsion dorsum of left hand, healed ulcer of left pretibial region - WOC consultation 7/3 appreciated and management per them.  Uncontrolled type II DM - Too tight CBG control. Hypoglycemic range CBG this morning. - Reduce Lantus  Anemia of chronic disease - Follow CBCs - Gradually dropping and 8.5 today. Follow CBC daily and transfuse if hemoglobin less than 7 g per DL. - Hemoglobin stable over last 24 hours.  Hypothyroid - Continue Synthroid  Chronic pain syndrome - Pain management  Cholecystitis, status post percutaneous cholecystostomy - Stable.? Further plans - As per discharge summary 03/10/14, percutaneous cholecystostomy tube was supposed to be in place for 6 weeks followed by surgical referral for cholecystectomy. - Surgical consultation appreciated. Cholangiogram via tube on 7/5 in IR. We will then determine if needs replacement tube versus pull tube versus cholecystectomy. - As per surgery: Cholangiogram was patent. Tube clamped today and in 1 week if tolerating clamp, IR will repeat cholangiogram and take out the tube. If he doesn't do well with it clamped, may need cholecystectomy versus continuing drain. If discharged, we will need follow-up with  IR.  Essential  hypertension - Continue amlodipine, carvedilol and Doxazosin - Controlled  Left mandibular angle superficial soft tissue mass 1.6 x 1.3 cm-likely sebaceous cyst - As per follow up recommendations during previous discharge 03/10/14, outpatient workup.  History of ulcerative colitis status post subtotal colectomy and ileostomy January 2016 - on no ulcerative colitis meds  Failure to thrive  Thrombocytosis - Possibly reactive    DVT prophylaxis: Lovenox Code Status: Full Family Communication: Discussed with patient's brother at bedside on 7/4 Disposition Plan: ? DC to SNF when medically stable   Consultants:  [place] [facility]  Nephrology  Procedures:  Coud Foley catheter 07/19/14  Antibiotics:  Oral vancomycin-DC'd   Subjective: Increased ostomy output. As per nursing, pus drainage from rectum (GI and surgery made aware).  Objective: Filed Vitals:   07/20/14 1300 07/20/14 2206 07/21/14 0508 07/21/14 1445  BP: 93/54 121/65 105/61 103/55  Pulse: 66 65 59 56  Temp: 97.8 F (36.6 C) 98.1 F (36.7 C) 98.9 F (37.2 C) 97.8 F (36.6 C)  TempSrc: Oral Oral Oral Oral  Resp: 17 16 19 18   Height:      Weight:      SpO2: 98% 97% 100% 98%    Intake/Output Summary (Last 24 hours) at 07/21/14 1741 Last data filed at 07/21/14 1330  Gross per 24 hour  Intake   3834 ml  Output   3900 ml  Net    -66 ml   Filed Weights   07/19/14 0236  Weight: 68.72 kg (151 lb 8 oz)     Exam:  General exam: Moderately built and frail chronically ill-looking male lying comfortably supine in bed. Respiratory system: Clear. No increased work of breathing. Cardiovascular system: S1 & S2 heard, RRR. No JVD, murmurs, gallops, clicks or pedal edema. Gastrointestinal system: Abdomen is nondistended, soft and nontender. Normal bowel sounds heard. Percutaneous cholecystostomy site drain intact. Colostomy draining liquid stools. Indwelling Foley catheter. Central nervous  system: Alert and oriented. No focal neurological deficits. Extremities: Symmetric 5 x 5 power. Skin: Detailed exam as per [place] consultation on 7/3. Approximately 5 cm diameter mid-sacral stage IV clean ulcer without signs of infection. Dressing over left dorsal hand and left shin clean, dry and intact.   Data Reviewed: Basic Metabolic Panel:  Recent Labs Lab 07/18/14 2218 07/19/14 0445 07/20/14 0331 07/21/14 0430  NA 135 136 133* 133*  K 4.1 3.7 4.1 3.2*  CL 105 108 108 104  CO2 20* 17* 15* 21*  GLUCOSE 150* 166* 133* 95  BUN 49* 52* 60* 70*  CREATININE 2.85* 2.86* 2.96* 2.63*  CALCIUM 11.3* 10.5* 9.7 10.0  PHOS  --   --   --  1.5*   Liver Function Tests:  Recent Labs Lab 07/20/14 0331 07/21/14 0430  AST 21  --   ALT 28  --   ALKPHOS 167*  --   BILITOT 0.3  --   PROT 7.3  --   ALBUMIN 2.5* 2.5*   No results for input(s): LIPASE, AMYLASE in the last 168 hours. No results for input(s): AMMONIA in the last 168 hours. CBC:  Recent Labs Lab 07/18/14 2218 07/19/14 0445 07/20/14 0331 07/21/14 0430  WBC 14.6* 15.2* 13.1* 15.3*  NEUTROABS 8.7*  --   --   --   HGB 9.9* 9.7* 8.5* 8.5*  HCT 31.9* 31.6* 27.0* 26.8*  MCV 95.5 94.6 96.8 95.4  PLT 723* 597* 558* 577*   Cardiac Enzymes: No results for input(s): CKTOTAL, CKMB, CKMBINDEX, TROPONINI in the last 168  hours. BNP (last 3 results) No results for input(s): PROBNP in the last 8760 hours. CBG:  Recent Labs Lab 07/20/14 2205 07/21/14 0721 07/21/14 0837 07/21/14 1104 07/21/14 1707  GLUCAP 132* 64* 128* 116* 111*    Recent Results (from the past 240 hour(s))  Clostridium Difficile by PCR (not at [facility])     Status: None   Collection Time: 07/18/14  8:58 PM  Result Value Ref Range Status   C difficile by pcr NEGATIVE NEGATIVE Final  Stool culture     Status: None (Preliminary result)   Collection Time: 07/19/14  5:37 AM  Result Value Ref Range Status   Specimen Description STOOL  Final   Special Requests  Normal  Final   Culture   Final    Culture reincubated for better growth Performed at [facility]    Report Status PENDING  Incomplete  MRSA PCR Screening     Status: Abnormal   Collection Time: 07/19/14  1:54 PM  Result Value Ref Range Status   MRSA by PCR POSITIVE (A) NEGATIVE Final    Comment:        The GeneXpert MRSA Assay (FDA approved for NASAL specimens only), is one component of a comprehensive MRSA colonization surveillance program. It is not intended to diagnose MRSA infection nor to guide or monitor treatment for MRSA infections. RESULT CALLED TO, READ BACK BY AND VERIFIED WITH: ESTERA,S RN 07/19/14 1951 [place]        Studies: Ir Sinus/fist Tube Chk-non Gi  07/21/2014   CLINICAL DATA:  History of acute pectus cholecystitis, post ultrasound guided cholecystostomy tube placement on 03/06/2014. Patient with complex medical history including recent subtotal colectomy with ileostomy as well as recent diagnosis of C diff.  Please perform fluoroscopic guided cholangiogram via existing cholecystostomy tube to evaluate for patency of the cystic and common bile ducts.  EXAM: SINUS TRACT INJECTION/FISTULOGRAM  COMPARISON:  Ultrasound guided cholecystostomy tube placement - 03/06/2014; CT abdomen pelvis -03/06/2014  CONTRAST:  21mL OMNIPAQUE IOHEXOL 300 MG/ML  SOLN  FLUOROSCOPY TIME:  dictate in minutes & seconds  TECHNIQUE: Patient was positioned supine on the fluoroscopy table. Preprocedural spot fluoroscopic image was obtained of the right upper abdominal quadrant existing cholecystostomy tube.  Multiple fluoroscopic and radiographic images were obtained in various obliquities following the injection of a small amount of contrast via the existing cholecystostomy tube.  Images were reviewed and the cholecystostomy tube was flushed with a small amount of saline and capped.  FINDINGS: Preprocedural spot fluoroscopic image demonstrates unchanged positioning of the cholecystostomy  tube with end coiled and locked overlying expected location of the gallbladder fossa.  Contrast injection demonstrates appropriate positioning and functioning of the existing cholecystostomy tube. There is brisk passage of contrast through the gallbladder with opacification of the cystic and common bile ducts. There is brisk passage of contrast through the biliary system to the level of the duodenum.  There is a persistent ill-defined filling defect within the gallbladder fundus which correlates with the gallstones seen on remote abdominal CT obtained 03/06/2014. There no discrete filling defects in the opacified portions of the biliary tree to suggest choledocholithiasis.  IMPRESSION: 1. Appropriately positioned and functioning cholecystostomy tube. No exchange performed. 2. Cholelithiasis without evidence of choledocholithiasis.  PLAN: The patient's cholecystostomy tube was capped for a trial of internalization.  Patient will return to the [facility] next Tuesday (7/12) for repeat percutaneous cholangiogram as well as evaluation for potential cholecystostomy tube removal.   Electronically  Signed   By: Sandi Mariscal M.D.   On: 07/21/2014 11:17   US Renal  07/20/2014   CLINICAL DATA:  Acute renal failure  EXAM: RENAL / URINARY TRACT ULTRASOUND COMPLETE  COMPARISON:  CT 03/06/2014  FINDINGS: Right Kidney:  Length: 12.4 cm. Increased echotexture throughout the right kidney. 2.2 cm lower pole cyst. No hydronephrosis.  Left Kidney:  Length: 11.9 cm. Increased echotexture throughout the left Kidney. No focal abnormality. No hydronephrosis.  Bladder:  Appears normal for degree of bladder distention.  Incidentally noted are gallstones within the gallbladder.  IMPRESSION: Increased echotexture within the kidneys compatible with chronic medical renal disease. No hydronephrosis.  Cholelithiasis.   Electronically Signed   By: Rolm Baptise M.D.   On: 07/20/2014 14:20        Scheduled  Meds: . amLODipine  10 mg Oral Daily  . aspirin  81 mg Oral Daily  . carvedilol  12.5 mg Oral BID WC  . Chlorhexidine Gluconate Cloth  6 each Topical Q0600  . cholestyramine  4 g Per Tube BID  . doxazosin  1 mg Oral Daily  . fentaNYL  50 mcg Transdermal Q72H  . ferrous sulfate  325 mg Oral Q breakfast  . heparin  5,000 Units Subcutaneous 3 times per day  . insulin aspart  0-5 Units Subcutaneous QHS  . insulin aspart  0-9 Units Subcutaneous TID WC  . insulin glargine  20 Units Subcutaneous QHS  . levothyroxine  100 mcg Oral QAC breakfast  . mirtazapine  15 mg Oral QHS  . multivitamin with minerals  1 tablet Oral Daily  . mupirocin ointment  1 application Nasal BID  . saccharomyces boulardii  250 mg Oral BID  . tamsulosin  0.4 mg Oral QPC breakfast   Continuous Infusions: . sodium chloride 200 mL/hr at 07/21/14 1523   Followed by  . sodium chloride      Principal Problem:   AKI (acute kidney injury) Active Problems:   Stage IV pressure ulcer of sacral region   Wound of left lower extremity   CKD (chronic kidney disease) stage 3, GFR 30-59 ml/min   DM type 2, controlled, with complication   Anemia, chronic disease   Hypothyroidism   Chronic pain syndrome   C. difficile diarrhea   Urinary retention   Leukocytosis    Time spent: 40 minutes.    Vernell Leep, MD, FACP, FHM. [facility] Pager [phone]  If 7PM-7AM, please contact night-coverage [email or web address] Password [email or web address] 07/21/2014, 5:41 PM    LOS: 2 days

## 2014-07-22 DIAGNOSIS — R197 Diarrhea, unspecified: Secondary | ICD-10-CM | POA: Insufficient documentation

## 2014-07-22 DIAGNOSIS — G894 Chronic pain syndrome: Secondary | ICD-10-CM

## 2014-07-22 DIAGNOSIS — N179 Acute kidney failure, unspecified: Principal | ICD-10-CM

## 2014-07-22 DIAGNOSIS — R339 Retention of urine, unspecified: Secondary | ICD-10-CM

## 2014-07-22 DIAGNOSIS — L89154 Pressure ulcer of sacral region, stage 4: Secondary | ICD-10-CM

## 2014-07-22 LAB — RENAL FUNCTION PANEL
ANION GAP: 6 (ref 5–15)
Albumin: 2.1 g/dL — ABNORMAL LOW (ref 3.5–5.0)
BUN: 53 mg/dL — AB (ref 6–20)
CO2: 23 mmol/L (ref 22–32)
Calcium: 9.4 mg/dL (ref 8.9–10.3)
Chloride: 109 mmol/L (ref 101–111)
Creatinine, Ser: 2.12 mg/dL — ABNORMAL HIGH (ref 0.61–1.24)
GFR calc Af Amer: 35 mL/min — ABNORMAL LOW (ref >60)
GFR calc non Af Amer: 30 mL/min — ABNORMAL LOW (ref >60)
GLUCOSE: 70 mg/dL (ref 65–99)
POTASSIUM: 3.7 mmol/L (ref 3.5–5.1)
Phosphorus: 2.7 mg/dL (ref 2.5–4.6)
Sodium: 138 mmol/L (ref 135–145)

## 2014-07-22 LAB — GLUCOSE, CAPILLARY
Glucose-Capillary: 71 mg/dL (ref 65–99)
Glucose-Capillary: 114 mg/dL — ABNORMAL HIGH (ref 65–99)
Glucose-Capillary: 113 mg/dL — ABNORMAL HIGH (ref 65–99)
Glucose-Capillary: 118 mg/dL — ABNORMAL HIGH (ref 65–99)

## 2014-07-22 LAB — MAGNESIUM: MAGNESIUM: 1.6 mg/dL — AB (ref 1.7–2.4)

## 2014-07-22 LAB — TSH: TSH: 1.455 u[IU]/mL (ref 0.350–4.500)

## 2014-07-22 MED ORDER — SODIUM CHLORIDE 0.9 % IV SOLN
INTRAVENOUS | Status: AC
  Administered 2014-07-22: 10:00:00 via INTRAVENOUS

## 2014-07-22 MED ORDER — LOPERAMIDE HCL 2 MG PO CAPS
2.0000 mg | ORAL_CAPSULE | Freq: Four times a day (QID) | ORAL | Status: DC
  Filled 2014-07-22: qty 1

## 2014-07-22 MED ORDER — SODIUM CHLORIDE 0.9 % IV SOLN
INTRAVENOUS | Status: DC
  Administered 2014-07-22: via INTRAVENOUS

## 2014-07-22 MED ORDER — LOPERAMIDE HCL 2 MG PO CAPS
2.0000 mg | ORAL_CAPSULE | Freq: Two times a day (BID) | ORAL | Status: DC
  Administered 2014-07-22 – 2014-07-24 (×4): 2 mg via ORAL
  Filled 2014-07-22 (×4): qty 1

## 2014-07-22 MED ORDER — MAGNESIUM SULFATE 2 GM/50ML IV SOLN
2.0000 g | Freq: Once | INTRAVENOUS | Status: AC
  Administered 2014-07-22: 2 g via INTRAVENOUS
  Filled 2014-07-22: qty 50

## 2014-07-22 NOTE — Care Management (Signed)
Important Message  Patient Details  Name: Gabriel Lynch MRN: [id number] Date of Birth: [birth year]   Medicare Important Message Given:  Yes-second notification given    Loann Quill 07/22/2014, 1:37 PM

## 2014-07-22 NOTE — Progress Notes (Signed)
Daily Rounding Note  07/22/2014, 9:48 AM  LOS: 3 days   SUBJECTIVE:       No complaints.  Ileostomy output of 1.2 liters yesterday, c/w 1.8 liters on 7/3 and 3.1 liters on 7/4.  Stool is firming up per RN who just emptied to ostomy.  Had to add some water to ostomy to help clear the bag.  Urine output 1.5 liters.   OBJECTIVE:         Vital signs in last 24 hours:    Temp:  [97.8 F (36.6 C)-98.9 F (37.2 C)] 98.9 F (37.2 C) (07/06 0607) Pulse Rate:  [56-58] 56 (07/06 0607) Resp:  [16-18] 16 (07/06 0607) BP: (103-120)/(55-56) 115/56 mmHg (07/06 0607) SpO2:  [97 %-98 %] 97 % (07/06 0607) Last BM Date: 07/21/14 Filed Weights   07/19/14 0236  Weight: 151 lb 8 oz (68.72 kg)   General: chronically ill appearing.     Heart: RRR Chest: clear bil.  No cough or SOB Abdomen: soft, slightly tender on left,   No guard or rebound.  Active BS.  Brown liquid stool in ostomy bag (RN says she added water to bag in order to be able to effectively empty what had been a more formed stool.  Extremities: no CCE Neuro/Psych:  orieneted x 3.  Unhappy.  Moves all 4s.   Intake/Output from previous day: 07/05 0701 - 07/06 0700 In: 6206 [P.O.:480; I.V.:5726] Out: 2725 [Urine:1500; Stool:1225]  Intake/Output this shift: Total I/O In: 360 [P.O.:360] Out: -   Lab Results:  Recent Labs  07/20/14 0331 07/21/14 0430  WBC 13.1* 15.3*  HGB 8.5* 8.5*  HCT 27.0* 26.8*  PLT 558* 577*   BMET  Recent Labs  07/20/14 0331 07/21/14 0430 07/22/14 0434  NA 133* 133* 138  K 4.1 3.2* 3.7  CL 108 104 109  CO2 15* 21* 23  GLUCOSE 133* 95 70  BUN 60* 70* 53*  CREATININE 2.96* 2.63* 2.12*  CALCIUM 9.7 10.0 9.4   LFT  Recent Labs  07/20/14 0331 07/21/14 0430 07/22/14 0434  PROT 7.3  --   --   ALBUMIN 2.5* 2.5* 2.1*  AST 21  --   --   ALT 28  --   --   ALKPHOS 167*  --   --   BILITOT 0.3  --   --    PT/INR No results for  input(s): LABPROT, INR in the last 72 hours. Hepatitis Panel No results for input(s): HEPBSAG, HCVAB, HEPAIGM, HEPBIGM in the last 72 hours.  Studies/Results: Ir Sinus/fist Tube Chk-non Gi  07/21/2014   CLINICAL DATA:  History of acute pectus cholecystitis, post ultrasound guided cholecystostomy tube placement on 03/06/2014. Patient with complex medical history including recent subtotal colectomy with ileostomy as well as recent diagnosis of C diff.  Please perform fluoroscopic guided cholangiogram via existing cholecystostomy tube to evaluate for patency of the cystic and common bile ducts.  EXAM: SINUS TRACT INJECTION/FISTULOGRAM  COMPARISON:  Ultrasound guided cholecystostomy tube placement - 03/06/2014; CT abdomen pelvis -03/06/2014  CONTRAST:  64mL OMNIPAQUE IOHEXOL 300 MG/ML  SOLN  FLUOROSCOPY TIME:  dictate in minutes & seconds  TECHNIQUE: Patient was positioned supine on the fluoroscopy table. Preprocedural spot fluoroscopic image was obtained of the right upper abdominal quadrant existing cholecystostomy tube.  Multiple fluoroscopic and radiographic images were obtained in various obliquities following the injection of a small amount of contrast via the existing cholecystostomy tube.  Images were reviewed  and the cholecystostomy tube was flushed with a small amount of saline and capped.  FINDINGS: Preprocedural spot fluoroscopic image demonstrates unchanged positioning of the cholecystostomy tube with end coiled and locked overlying expected location of the gallbladder fossa.  Contrast injection demonstrates appropriate positioning and functioning of the existing cholecystostomy tube. There is brisk passage of contrast through the gallbladder with opacification of the cystic and common bile ducts. There is brisk passage of contrast through the biliary system to the level of the duodenum.  There is a persistent ill-defined filling defect within the gallbladder fundus which correlates with the gallstones  seen on remote abdominal CT obtained 03/06/2014. There no discrete filling defects in the opacified portions of the biliary tree to suggest choledocholithiasis.  IMPRESSION: 1. Appropriately positioned and functioning cholecystostomy tube. No exchange performed. 2. Cholelithiasis without evidence of choledocholithiasis.  PLAN: The patient's cholecystostomy tube was capped for a trial of internalization.  Patient will return to the [facility] next Tuesday (7/12) for repeat percutaneous cholangiogram as well as evaluation for potential cholecystostomy tube removal.   Electronically Signed   By: Sandi Mariscal M.D.   On: 07/21/2014 11:17   US Renal  07/20/2014   CLINICAL DATA:  Acute renal failure  EXAM: RENAL / URINARY TRACT ULTRASOUND COMPLETE  COMPARISON:  CT 03/06/2014  FINDINGS: Right Kidney:  Length: 12.4 cm. Increased echotexture throughout the right kidney. 2.2 cm lower pole cyst. No hydronephrosis.  Left Kidney:  Length: 11.9 cm. Increased echotexture throughout the left Kidney. No focal abnormality. No hydronephrosis.  Bladder:  Appears normal for degree of bladder distention.  Incidentally noted are gallstones within the gallbladder.  IMPRESSION: Increased echotexture within the kidneys compatible with chronic medical renal disease. No hydronephrosis.  Cholelithiasis.   Electronically Signed   By: Rolm Baptise M.D.   On: 07/20/2014 14:20   Scheduled Meds: . amLODipine  10 mg Oral Daily  . aspirin  81 mg Oral Daily  . carvedilol  12.5 mg Oral BID WC  . Chlorhexidine Gluconate Cloth  6 each Topical Q0600  . cholestyramine  4 g Per Tube BID  . doxazosin  1 mg Oral Daily  . fentaNYL  50 mcg Transdermal Q72H  . ferrous sulfate  325 mg Oral Q breakfast  . heparin  5,000 Units Subcutaneous 3 times per day  . insulin aspart  0-9 Units Subcutaneous TID WC  . insulin glargine  15 Units Subcutaneous QHS  . levothyroxine  100 mcg Oral QAC breakfast  . loperamide  2 mg  Oral QID  . magnesium sulfate 1 - 4 g bolus IVPB  2 g Intravenous Once  . mirtazapine  15 mg Oral QHS  . multivitamin with minerals  1 tablet Oral Daily  . mupirocin ointment  1 application Nasal BID  . saccharomyces boulardii  250 mg Oral BID  . tamsulosin  0.4 mg Oral QPC breakfast   Continuous Infusions: . sodium chloride 75 mL/hr at 07/22/14 0949   PRN Meds:.acetaminophen **OR** [DISCONTINUED] acetaminophen, alum & mag hydroxide-simeth, ondansetron **OR** ondansetron (ZOFRAN) IV, oxyCODONE  ASSESMENT:   * Hx UC for ~ 7 years. S/p Subtotal colectomy and ileostomy Jan 2016 in [place], [place]. No UC meds ([facility]) since then.  C diff treated with course of Vanco, completed 07/12/14. Worsening diarrhea but repeat c diff PCR negative. WBCs rising.  Questran added to Imodium on 7/5. On Florastor.   * Acute cholecystitis, s/p perc biliary drain 02/25/14. Cholangiogram today with patent cystic  duct, tube clamped.LFTs normal.   * AKI, CKD. improving BUN/creat.  Hypokalemia resolved.   * Sacral decubitus with osteomyelitis. Finished prolonged course abx after 02/2014 discharge.   * Normocytic anemia, anemia of chronic disease. On daily oral iron. Hgb drop from ~ 12.5 to 8.5 to 9.5 since 04/2014. Required PRBC for Hgb 6.6 on 03/18/14.   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    *  Hx hypothyroidism.  TSH normal at 1.4  *  Chronic pain from various non-healing wounds on sacrum and right hand/forearm.    *  Low albumin, suspect protein cal malnutrition.    PLAN   *  Continue the BID questran.  Reduced Imodium to bid.  As diarrhea improves further would stop the Imodium and slow questran to once daily.  Ultimately goal is stopping [place].  *  Prealbumin in AM.  Consider nutritional/RD eval.     Gabriel Lynch  07/22/2014, 9:48 AM Pager: [phone]  GI ATTENDING  Interval history data reviewed. Agree with interval progress note as outlined above. Ostomy output has not been and  is not overwhelming. No evidence for infectious process. This is not short gut. Continue with anti-diarrheal's and Questran as outlined above. Continue with adequate baseline hydration and replacement of body fluid losses, as you are doing... No additional recommendations from GI standpoint. Will sign off. Thank you  Docia Chuck. Geri Seminole., M.D. [facility] Division of Gastroenterology

## 2014-07-22 NOTE — Evaluation (Signed)
Physical Therapy Evaluation Patient Details Name: Gabriel Lynch MRN: [id number] DOB: 07-Apr-1945 Today's Date: 07/22/2014   History of Present Illness  Pt is a 69 y.o. male with a complex medical history who was sent from the [facility] SNF due to problems with Urinary retention, and worsening of his wounds along with increased output from his colostomy. Of Note patient had A history of Sepsis and Septic Shock with Acute Renal Failure requiring Short Term Dialysis due to complications of Cholecystitis and had been in ICU, then transferred to [facility], then to [facility] for [place], and then to [facility] towards the last week of June 2016.    Clinical Impression  Patient presents with decreased mobility due to deficits listed in PT problem list.  Patient has been very reluctant to move since admission with reports of "falling out" last time up walking at rehab 5 days or so prior to admission.  Feel he is anxious and in pain and needs much encouragement for mobility and to work through symptoms.  May benefit from SW versus psych consult for mood issues which seem to be affecting his participation in therapy.  Feel he needs WOC reconsult due to increased foul smelling drainage from sacral wound per pt report and likely would benefit from air mattress.  Will follow acutely and recommend continued SNF rehab at d/c.    Follow Up Recommendations SNF    Equipment Recommendations  Other (comment) (TBA)    Recommendations for Other Services       Precautions / Restrictions Precautions Precautions: Fall      Mobility  Bed Mobility Overal bed mobility: Needs Assistance Bed Mobility: Rolling;Sidelying to Sit;Sit to Sidelying Rolling: Supervision Sidelying to sit: Min assist     Sit to sidelying: Min assist General bed mobility comments: use of rails for mobility, cues for side to sit due to h/o abdominal surgery  Transfers Overall transfer level:  Needs assistance Equipment used: Rolling walker (2 wheeled) Transfers: Sit to/from Stand Sit to Stand: Min assist;From elevated surface         General transfer comment: increased time and much encouragement needed to even get pt to EOB, able to stand despite c/o woozy, light headed with BP WNL  Ambulation/Gait             General Gait Details: declined due to wooziness  Science writer    Modified Rankin (Stroke Patients Only)       Balance Overall balance assessment: Needs assistance         Standing balance support: Bilateral upper extremity supported Standing balance-Leahy Scale: Poor Standing balance comment: UE support needed in standing due to weakness, wooziness                             Pertinent Vitals/Pain Pain Assessment: Faces Faces Pain Scale: Hurts whole lot Pain Location: sacrum, left hand Pain Descriptors / Indicators: Grimacing;Guarding Pain Intervention(s): Limited activity within patient's tolerance;Monitored during session;Repositioned    Home Living Family/patient expects to be discharged to:: Skilled nursing facility                      Prior Function Level of Independence: Needs assistance   Gait / Transfers Assistance Needed: Pt reports walking halls at SNF a week ago (end of June 2016) with min guard assist using a walker.  ADL's / Homemaking Assistance Needed: Per pt report he was setup level for UB bathing/dressing and completed LB dressing at max A level at bed level. Pt reports using a urinal for toileting.         Hand Dominance   Dominant Hand: Right    Extremity/Trunk Assessment               Lower Extremity Assessment: Generalized weakness         Communication   Communication: No difficulties  Cognition Arousal/Alertness: Awake/alert Behavior During Therapy: Anxious Overall Cognitive Status: No family/caregiver present to determine baseline cognitive  functioning                      General Comments General comments (skin integrity, edema, etc.): patient with several c/o worsening sacral wound due to not recieving same medication as when in previous facilities.  Also states has been on air mattress previously.  Discussed with PA from surgery likely needs air overlay and with RN to reconsult WOB nurse for recommendations as sacral wound with increased foul smelling drainage.per patient report    Exercises        Assessment/Plan    PT Assessment Patient needs continued PT services  PT Diagnosis Generalized weakness;Acute pain   PT Problem List Decreased strength;Pain;Decreased activity tolerance;Decreased balance;Decreased mobility;Decreased knowledge of use of DME  PT Treatment Interventions DME instruction;Balance training;Gait training;Functional mobility training;Patient/family education;Therapeutic activities;Therapeutic exercise   PT Goals (Current goals can be found in the Care Plan section) Acute Rehab PT Goals Patient Stated Goal: To get better PT Goal Formulation: With patient Time For Goal Achievement: 08/05/14 Potential to Achieve Goals: Fair    Frequency Min 3X/week   Barriers to discharge        Co-evaluation               End of Session   Activity Tolerance: Patient limited by fatigue;Patient limited by pain Patient left: in bed;with call bell/phone within reach Nurse Communication: Mobility status         Time: 4270-6237 PT Time Calculation (min) (ACUTE ONLY): 30 min   Charges:   PT Evaluation $Initial PT Evaluation Tier I: 1 Procedure PT Treatments $Therapeutic Activity: 8-22 mins   PT G Codes:        Deborha Moseley,CYNDI 2014/08/14, 10:25 AM  Magda Kiel, PT [phone] 08/14/2014

## 2014-07-22 NOTE — Clinical Social Work Note (Signed)
CSW received referral stating that patient is from [facility] as a long term care resident.  CSW met with patient to discuss where he is from, and patient stated he is from [place] and would like to return.  CSW talked to patient about role of social worker who will facilitate transportation and discharge planning.  CSW to continue to follow patient's progress, formal assessment to follow.  Jones Broom. [place], MSW, [place] 07/22/2014 1:04 PM

## 2014-07-22 NOTE — Care Management (Signed)
Important Message  Patient Details  Name: Mostyn Varnell MRN: [id number] Date of Birth: 1945-10-24   Medicare Important Message Given:   07-22-14    Marilu Favre, RN 07/22/2014, 12:54 PM

## 2014-07-22 NOTE — Progress Notes (Signed)
Patient ID: Gabriel Lynch, male   DOB: September 21, 1945, 69 y.o.   MRN: [id number]    Subjective: Pt with no increase in abdominal pain with eating since tube clamped off.  C/o pain all over this morning.  Objective: Vital signs in last 24 hours: Temp:  [97.8 F (36.6 C)-98.9 F (37.2 C)] 98.9 F (37.2 C) (07/06 0607) Pulse Rate:  [56-58] 56 (07/06 0607) Resp:  [16-18] 16 (07/06 0607) BP: (103-120)/(55-56) 115/56 mmHg (07/06 0607) SpO2:  [97 %-98 %] 97 % (07/06 0607) Last BM Date: 07/21/14  Intake/Output from previous day: 07/05 0701 - 07/06 0700 In: 6206 [P.O.:480; I.V.:5726] Out: 2725 [Urine:1500; Stool:1225] Intake/Output this shift: Total I/O In: 360 [P.O.:360] Out: -   PE: Abd: soft, mild tenderness, drain capped off, RLQ ileostomy with some thicker output.  Stoma is viable  Lab Results:   Recent Labs  07/20/14 0331 07/21/14 0430  WBC 13.1* 15.3*  HGB 8.5* 8.5*  HCT 27.0* 26.8*  PLT 558* 577*   BMET  Recent Labs  07/21/14 0430 07/22/14 0434  NA 133* 138  K 3.2* 3.7  CL 104 109  CO2 21* 23  GLUCOSE 95 70  BUN 70* 53*  CREATININE 2.63* 2.12*  CALCIUM 10.0 9.4   PT/INR No results for input(s): LABPROT, INR in the last 72 hours. CMP     Component Value Date/Time   NA 138 07/22/2014 0434   K 3.7 07/22/2014 0434   CL 109 07/22/2014 0434   CO2 23 07/22/2014 0434   GLUCOSE 70 07/22/2014 0434   BUN 53* 07/22/2014 0434   CREATININE 2.12* 07/22/2014 0434   CALCIUM 9.4 07/22/2014 0434   CALCIUM 8.5* 03/18/2014 0700   PROT 7.3 07/20/2014 0331   ALBUMIN 2.1* 07/22/2014 0434   AST 21 07/20/2014 0331   ALT 28 07/20/2014 0331   ALKPHOS 167* 07/20/2014 0331   BILITOT 0.3 07/20/2014 0331   GFRNONAA 30* 07/22/2014 0434   GFRAA 35* 07/22/2014 0434   Lipase     Component Value Date/Time   LIPASE 197* 03/09/2014 0900       Studies/Results: Ir Sinus/fist Tube Chk-non Gi  07/21/2014   CLINICAL DATA:  History of acute pectus cholecystitis, post ultrasound  guided cholecystostomy tube placement on 03/06/2014. Patient with complex medical history including recent subtotal colectomy with ileostomy as well as recent diagnosis of C diff.  Please perform fluoroscopic guided cholangiogram via existing cholecystostomy tube to evaluate for patency of the cystic and common bile ducts.  EXAM: SINUS TRACT INJECTION/FISTULOGRAM  COMPARISON:  Ultrasound guided cholecystostomy tube placement - 03/06/2014; CT abdomen pelvis -03/06/2014  CONTRAST:  35mL OMNIPAQUE IOHEXOL 300 MG/ML  SOLN  FLUOROSCOPY TIME:  dictate in minutes & seconds  TECHNIQUE: Patient was positioned supine on the fluoroscopy table. Preprocedural spot fluoroscopic image was obtained of the right upper abdominal quadrant existing cholecystostomy tube.  Multiple fluoroscopic and radiographic images were obtained in various obliquities following the injection of a small amount of contrast via the existing cholecystostomy tube.  Images were reviewed and the cholecystostomy tube was flushed with a small amount of saline and capped.  FINDINGS: Preprocedural spot fluoroscopic image demonstrates unchanged positioning of the cholecystostomy tube with end coiled and locked overlying expected location of the gallbladder fossa.  Contrast injection demonstrates appropriate positioning and functioning of the existing cholecystostomy tube. There is brisk passage of contrast through the gallbladder with opacification of the cystic and common bile ducts. There is brisk passage of contrast through the biliary system to the  level of the duodenum.  There is a persistent ill-defined filling defect within the gallbladder fundus which correlates with the gallstones seen on remote abdominal CT obtained 03/06/2014. There no discrete filling defects in the opacified portions of the biliary tree to suggest choledocholithiasis.  IMPRESSION: 1. Appropriately positioned and functioning cholecystostomy tube. No exchange performed. 2.  Cholelithiasis without evidence of choledocholithiasis.  PLAN: The patient's cholecystostomy tube was capped for a trial of internalization.  Patient will return to the [facility] next Tuesday (7/12) for repeat percutaneous cholangiogram as well as evaluation for potential cholecystostomy tube removal.   Electronically Signed   By: Sandi Mariscal M.D.   On: 07/21/2014 11:17   US Renal  07/20/2014   CLINICAL DATA:  Acute renal failure  EXAM: RENAL / URINARY TRACT ULTRASOUND COMPLETE  COMPARISON:  CT 03/06/2014  FINDINGS: Right Kidney:  Length: 12.4 cm. Increased echotexture throughout the right kidney. 2.2 cm lower pole cyst. No hydronephrosis.  Left Kidney:  Length: 11.9 cm. Increased echotexture throughout the left Kidney. No focal abnormality. No hydronephrosis.  Bladder:  Appears normal for degree of bladder distention.  Incidentally noted are gallstones within the gallbladder.  IMPRESSION: Increased echotexture within the kidneys compatible with chronic medical renal disease. No hydronephrosis.  Cholelithiasis.   Electronically Signed   By: Rolm Baptise M.D.   On: 07/20/2014 14:20    Anti-infectives: Anti-infectives    Start     Dose/Rate Route Frequency Ordered Stop   08/17/14 1000  vancomycin (VANCOCIN) 50 mg/mL oral solution 125 mg  Status:  Discontinued     125 mg Oral Every 3 DAYS 07/19/14 0224 07/19/14 1300   08/09/14 1000  vancomycin (VANCOCIN) 50 mg/mL oral solution 125 mg  Status:  Discontinued     125 mg Oral Every other day 07/19/14 0224 07/19/14 1300   08/02/14 1000  vancomycin (VANCOCIN) 50 mg/mL oral solution 125 mg  Status:  Discontinued     125 mg Oral Daily 07/19/14 0224 07/19/14 1300   07/26/14 1000  vancomycin (VANCOCIN) 50 mg/mL oral solution 125 mg  Status:  Discontinued     125 mg Oral 2 times daily 07/19/14 0224 07/19/14 1300   07/19/14 1000  vancomycin (VANCOCIN) 50 mg/mL oral solution 125 mg  Status:  Discontinued     125 mg Oral 4  times daily 07/19/14 0224 07/19/14 1300       Assessment/Plan   Prior cholecystitis/cholelithiaisis with perc chole since February 2016 -Cholangiogram was patent, clamped tube yesterday and in 1 week if tolerating clamp, IR will repeat cholangiogram and take out the tube. If he doesn't do well with it clamped may need cholecystectomy vs continuing drain. Recent subtotal colectomy with ileostomy Recent c.diff abs course completed 6/27 High ileostomy output/Dehydration -imodium, GI placed on questran as well -output down to 1200cc yesterday Decubitus ulcers -per WOC RN - 100% moist, pink, beefy red -c/o sacral pain, try air overlay mattress  LOS: 3 days    Gabreille Dardis E 07/22/2014, 9:24 AM Pager: [phone]

## 2014-07-22 NOTE — Progress Notes (Addendum)
Patient ID: Gabriel Lynch, male   DOB: 04-14-1945, 69 y.o.   MRN: [id number]  [facility] Progress Note    Assessment/ Plan:   1. Acute renal failure on chronic kidney disease stage III: Appears to have been hemodynamically mediated acute on chronic renal failure-possible evolution to ATN (urine sodium <10, FeNa <1%). With positive fluid balance overnight after escalation of intravenous fluid rate-renal function appears to be improving and he is nonoliguric. Continue normal saline at 75 mL/h with his continued colostomy output. 2. Anion gap metabolic acidosis: Secondary to acute renal failure with ongoing GI losses-corrected with isotonic sodium bicarbonate mL appears to be improving with improving renal function. 3. Increased colostomy output: Recently treated for C. difficile colitis-recheck negative for C. difficile PCR. Colostomy output reduced with Imodium-continue to monitor 4. History of cholecystitis status post percutaneous drainage-ongoing evaluation for possible cholecystectomy 5. Anemia: Appears to be anemia of chronic disease, he has a low iron saturation and permissive ferritin-Will give intravenous iron  Subjective:   Reports to be feeling better , oriented and denies any emergent complaints-ostomy output somewhat improved with Imodium    Objective:   BP 119/57 mmHg  Pulse 66  Temp(Src) 98.9 F (37.2 C) (Oral)  Resp 16  Ht 6\' 2"  (1.88 m)  Wt 68.72 kg (151 lb 8 oz)  BMI 19.44 kg/m2  SpO2 97%  Intake/Output Summary (Last 24 hours) at 07/22/14 0953 Last data filed at 07/22/14 0857  Gross per 24 hour  Intake   6566 ml  Output   2725 ml  Net   3841 ml   Weight change:   Physical Exam: Gen: Comfortably resting in bed CVS: Pulse regular in rate and rhythm, S1 and S2 normal Resp: Clear to auscultation, no rales Abd: Soft, flat, nontender, colostomy in situ Ext: No lower extremity edema  Imaging: Ir Sinus/fist Tube Chk-non Gi  07/21/2014   CLINICAL  DATA:  History of acute pectus cholecystitis, post ultrasound guided cholecystostomy tube placement on 03/06/2014. Patient with complex medical history including recent subtotal colectomy with ileostomy as well as recent diagnosis of C diff.  Please perform fluoroscopic guided cholangiogram via existing cholecystostomy tube to evaluate for patency of the cystic and common bile ducts.  EXAM: SINUS TRACT INJECTION/FISTULOGRAM  COMPARISON:  Ultrasound guided cholecystostomy tube placement - 03/06/2014; CT abdomen pelvis -03/06/2014  CONTRAST:  93mL OMNIPAQUE IOHEXOL 300 MG/ML  SOLN  FLUOROSCOPY TIME:  dictate in minutes & seconds  TECHNIQUE: Patient was positioned supine on the fluoroscopy table. Preprocedural spot fluoroscopic image was obtained of the right upper abdominal quadrant existing cholecystostomy tube.  Multiple fluoroscopic and radiographic images were obtained in various obliquities following the injection of a small amount of contrast via the existing cholecystostomy tube.  Images were reviewed and the cholecystostomy tube was flushed with a small amount of saline and capped.  FINDINGS: Preprocedural spot fluoroscopic image demonstrates unchanged positioning of the cholecystostomy tube with end coiled and locked overlying expected location of the gallbladder fossa.  Contrast injection demonstrates appropriate positioning and functioning of the existing cholecystostomy tube. There is brisk passage of contrast through the gallbladder with opacification of the cystic and common bile ducts. There is brisk passage of contrast through the biliary system to the level of the duodenum.  There is a persistent ill-defined filling defect within the gallbladder fundus which correlates with the gallstones seen on remote abdominal CT obtained 03/06/2014. There no discrete filling defects in the opacified portions of the biliary tree to suggest choledocholithiasis.  IMPRESSION: 1. Appropriately positioned and  functioning cholecystostomy tube. No exchange performed. 2. Cholelithiasis without evidence of choledocholithiasis.  PLAN: The patient's cholecystostomy tube was capped for a trial of internalization.  Patient will return to the [facility] next Tuesday (7/12) for repeat percutaneous cholangiogram as well as evaluation for potential cholecystostomy tube removal.   Electronically Signed   By: Sandi Mariscal M.D.   On: 07/21/2014 11:17   US Renal  07/20/2014   CLINICAL DATA:  Acute renal failure  EXAM: RENAL / URINARY TRACT ULTRASOUND COMPLETE  COMPARISON:  CT 03/06/2014  FINDINGS: Right Kidney:  Length: 12.4 cm. Increased echotexture throughout the right kidney. 2.2 cm lower pole cyst. No hydronephrosis.  Left Kidney:  Length: 11.9 cm. Increased echotexture throughout the left Kidney. No focal abnormality. No hydronephrosis.  Bladder:  Appears normal for degree of bladder distention.  Incidentally noted are gallstones within the gallbladder.  IMPRESSION: Increased echotexture within the kidneys compatible with chronic medical renal disease. No hydronephrosis.  Cholelithiasis.   Electronically Signed   By: Rolm Baptise M.D.   On: 07/20/2014 14:20    Labs: BMET  Recent Labs Lab 07/18/14 2218 07/19/14 0445 07/20/14 0331 07/21/14 0430 07/22/14 0434  NA 135 136 133* 133* 138  K 4.1 3.7 4.1 3.2* 3.7  CL 105 108 108 104 109  CO2 20* 17* 15* 21* 23  GLUCOSE 150* 166* 133* 95 70  BUN 49* 52* 60* 70* 53*  CREATININE 2.85* 2.86* 2.96* 2.63* 2.12*  CALCIUM 11.3* 10.5* 9.7 10.0 9.4  PHOS  --   --   --  1.5* 2.7   CBC  Recent Labs Lab 07/18/14 2218 07/19/14 0445 07/20/14 0331 07/21/14 0430  WBC 14.6* 15.2* 13.1* 15.3*  NEUTROABS 8.7*  --   --   --   HGB 9.9* 9.7* 8.5* 8.5*  HCT 31.9* 31.6* 27.0* 26.8*  MCV 95.5 94.6 96.8 95.4  PLT 723* 597* 558* 577*    Medications:    . amLODipine  10 mg Oral Daily  . aspirin  81 mg Oral Daily  . carvedilol  12.5 mg  Oral BID WC  . Chlorhexidine Gluconate Cloth  6 each Topical Q0600  . cholestyramine  4 g Per Tube BID  . doxazosin  1 mg Oral Daily  . fentaNYL  50 mcg Transdermal Q72H  . ferrous sulfate  325 mg Oral Q breakfast  . heparin  5,000 Units Subcutaneous 3 times per day  . insulin aspart  0-9 Units Subcutaneous TID WC  . insulin glargine  15 Units Subcutaneous QHS  . levothyroxine  100 mcg Oral QAC breakfast  . loperamide  2 mg Oral QID  . magnesium sulfate 1 - 4 g bolus IVPB  2 g Intravenous Once  . mirtazapine  15 mg Oral QHS  . multivitamin with minerals  1 tablet Oral Daily  . mupirocin ointment  1 application Nasal BID  . saccharomyces boulardii  250 mg Oral BID  . tamsulosin  0.4 mg Oral QPC breakfast   Elmarie Shiley, MD 07/22/2014, 9:53 AM

## 2014-07-22 NOTE — Progress Notes (Signed)
PROGRESS NOTE    Gabriel Lynch KPT:[id number] DOB: [birth year] DOA: 07/18/2014 PCP: [name], MD  HPI/Brief narrative 69 y.o. male with a complex medical history who was sent from the [facility] due to problems with Urinary retention, and worsening of his wounds along with increased output from his colostomy. He recently completed a course of Oral Vancomycin for C.Diff Colitison 06/27, and shortly thereafter he has had increased watery stool for the past 3-4 days. He denies any nausea or vomiting or and fever or chills.   Of Note patient hada history of Sepsis and Septic Shock with Acute Renal Failure requiring Short Term Dialysis due to complications of Cholecystitis and had been in ICU, then transferred to [facility], then to [facility] for [facility], and then to [facility] 1 week ago.  Admitted for diarrhea, repeat C. difficile PCR negative, dehydration, acute on chronic kidney disease. General surgery consulted for recommendations regarding percutaneous cholecystostomy. Nephrology following regarding acute on chronic renal failure. GI consulted regarding ongoing increased ostomy output.   Assessment/Plan:  Diarrhea/recently treated C. difficile colitis - Recently completed course of vancomycin for C. difficile colitis on 07/13/14-exact duration of treatment not known. - C. difficile PCR negative - ? Acute GE versus IBS from recent C. Difficile - Discontinue vancomycin. Continue probiotics - Follow stool culture results - GI consulted and have started Questran, better,   Dehydration - Likely due to GI losses - Mild hypercalcemia possibly related to same - s/p bicarbonate drip then ns by nephrology. Encourage oral intake,   Acute on stage III chronic kidney disease/ NAG metabolic acidosis - Related to GI losses, hemodynamically mediated, possible evolution to ATN - Creatinine on 04/28/14 was 1.28. Admitted with creatinine of 2.85 -  Creatinine increased to 2.9 despite IV fluid hydration. Likely still quite volume depleted. - Nephrology consulted and have increased IV fluids and briefly treated with bicarbonate drip for NAG metabolic acidosis - Renal ultrasound shows chronic medical renal disease without hydronephrosis. - Creatinine improving. Fluids changed to normal saline at increased rate. Encourage oral intake  Urinary retention - Coud Foley placed in ED - Did not have Foley catheter prior to admission but had one placed remotely per patient - Continue tamsulosin, minimize opiods - May need to discharge on fully catheter with outpatient consultation with urology  Multiple wounds-stage IV sacral pressure ulcer/sacral osteomyelitis, healed skin tears/avulsion dorsum of left hand, healed ulcer of left pretibial region - WOC consultation 7/3 appreciated and management per them. -reconsult WOC on 7/6  Uncontrolled type II DM - Too tight CBG control. Hypoglycemic range CBG this morning. - Reduce Lantus  Anemia of chronic disease - Follow CBCs - Gradually dropping and 8.5 today. Follow CBC daily and transfuse if hemoglobin less than 7 g per DL. - Hemoglobin stable over last 24 hours.  Hypothyroid - Continue Synthroid  Chronic pain syndrome - Pain management  Cholecystitis, status post percutaneous cholecystostomy - Stable.? Further plans - As per discharge summary 03/10/14, percutaneous cholecystostomy tube was supposed to be in place for 6 weeks followed by surgical referral for cholecystectomy. - Surgical consultation appreciated. Cholangiogram via tube on 7/5 in IR. We will then determine if needs replacement tube versus pull tube versus cholecystectomy. - As per surgery: Cholangiogram was patent. Tube clamped today and in 1 week if tolerating clamp, IR will repeat cholangiogram and take out the tube. If he doesn't do well with it clamped, may need cholecystectomy versus continuing drain. If discharged, we will  need follow-up with IR.  Essential hypertension - Continue amlodipine, carvedilol and Doxazosin - Controlled  Left mandibular angle superficial soft tissue mass 1.6 x 1.3 cm-likely sebaceous cyst - As per follow up recommendations during previous discharge 03/10/14, outpatient workup.  History of ulcerative colitis status post subtotal colectomy and ileostomy January 2016 - on no ulcerative colitis meds  Failure to thrive  Thrombocytosis - Possibly reactive    DVT prophylaxis: Lovenox Code Status: Full Family Communication: Discussed with patient's brother at bedside on 7/4 and 7/6 Disposition Plan: SNF when medically stable   Consultants:  [place] [facility]  Nephrology  GI  Procedures:  Coud Foley catheter 07/19/14  Antibiotics:  Oral vancomycin-DC'd   Subjective: ostomy output more solid, diarrhea seems has stopped. C/o sacral pain.  Objective: Filed Vitals:   07/22/14 0933 07/22/14 0940 07/22/14 1350 07/22/14 1726  BP: 150/61 119/57 119/62 131/66  Pulse: 77 66 68 60  Temp:   98.9 F (37.2 C)   TempSrc:   Oral   Resp:   16   Height:      Weight:      SpO2:   99%     Intake/Output Summary (Last 24 hours) at 07/22/14 1854 Last data filed at 07/22/14 1600  Gross per 24 hour  Intake   4646 ml  Output   2725 ml  Net   1921 ml   Filed Weights   07/19/14 0236  Weight: 68.72 kg (151 lb 8 oz)     Exam:  General exam: Moderately built and frail chronically ill-looking male lying comfortably supine in bed. Respiratory system: Clear. No increased work of breathing. Cardiovascular system: S1 & S2 heard, RRR. No JVD, murmurs, gallops, clicks or pedal edema. Gastrointestinal system: Abdomen is nondistended, soft and nontender. Normal bowel sounds heard. Percutaneous cholecystostomy site drain clamped. Colostomy draining more formed stools. Indwelling Foley catheter. Central nervous system: Alert and oriented. No focal neurological  deficits. Extremities: Symmetric 5 x 5 power. Skin: Detailed exam as per [place] consultation on 7/3. Approximately 5 cm diameter mid-sacral stage IV clean ulcer without signs of infection. Dressing over left dorsal hand and left shin clean, dry and intact.   Data Reviewed: Basic Metabolic Panel:  Recent Labs Lab 07/18/14 2218 07/19/14 0445 07/20/14 0331 07/21/14 0430 07/22/14 0434  NA 135 136 133* 133* 138  K 4.1 3.7 4.1 3.2* 3.7  CL 105 108 108 104 109  CO2 20* 17* 15* 21* 23  GLUCOSE 150* 166* 133* 95 70  BUN 49* 52* 60* 70* 53*  CREATININE 2.85* 2.86* 2.96* 2.63* 2.12*  CALCIUM 11.3* 10.5* 9.7 10.0 9.4  MG  --   --   --   --  1.6*  PHOS  --   --   --  1.5* 2.7   Liver Function Tests:  Recent Labs Lab 07/20/14 0331 07/21/14 0430 07/22/14 0434  AST 21  --   --   ALT 28  --   --   ALKPHOS 167*  --   --   BILITOT 0.3  --   --   PROT 7.3  --   --   ALBUMIN 2.5* 2.5* 2.1*   No results for input(s): LIPASE, AMYLASE in the last 168 hours. No results for input(s): AMMONIA in the last 168 hours. CBC:  Recent Labs Lab 07/18/14 2218 07/19/14 0445 07/20/14 0331 07/21/14 0430  WBC 14.6* 15.2* 13.1* 15.3*  NEUTROABS 8.7*  --   --   --   HGB  9.9* 9.7* 8.5* 8.5*  HCT 31.9* 31.6* 27.0* 26.8*  MCV 95.5 94.6 96.8 95.4  PLT 723* 597* 558* 577*   Cardiac Enzymes: No results for input(s): CKTOTAL, CKMB, CKMBINDEX, TROPONINI in the last 168 hours. BNP (last 3 results) No results for input(s): PROBNP in the last 8760 hours. CBG:  Recent Labs Lab 07/21/14 1707 07/21/14 2119 07/22/14 0715 07/22/14 1203 07/22/14 1714  GLUCAP 111* 109* 71 114* 113*    Recent Results (from the past 240 hour(s))  Clostridium Difficile by PCR (not at [facility])     Status: None   Collection Time: 07/18/14  8:58 PM  Result Value Ref Range Status   C difficile by pcr NEGATIVE NEGATIVE Final  Stool culture     Status: None (Preliminary result)   Collection Time: 07/19/14  5:37 AM  Result Value  Ref Range Status   Specimen Description STOOL  Final   Special Requests Normal  Final   Culture   Final    NO SUSPICIOUS COLONIES, CONTINUING TO HOLD Performed at [facility]    Report Status PENDING  Incomplete  MRSA PCR Screening     Status: Abnormal   Collection Time: 07/19/14  1:54 PM  Result Value Ref Range Status   MRSA by PCR POSITIVE (A) NEGATIVE Final    Comment:        The GeneXpert MRSA Assay (FDA approved for NASAL specimens only), is one component of a comprehensive MRSA colonization surveillance program. It is not intended to diagnose MRSA infection nor to guide or monitor treatment for MRSA infections. RESULT CALLED TO, READ BACK BY AND VERIFIED WITH: ESTERA,S RN 07/19/14 1951 [place]        Studies: Ir Sinus/fist Tube Chk-non Gi  07/21/2014   CLINICAL DATA:  History of acute pectus cholecystitis, post ultrasound guided cholecystostomy tube placement on 03/06/2014. Patient with complex medical history including recent subtotal colectomy with ileostomy as well as recent diagnosis of C diff.  Please perform fluoroscopic guided cholangiogram via existing cholecystostomy tube to evaluate for patency of the cystic and common bile ducts.  EXAM: SINUS TRACT INJECTION/FISTULOGRAM  COMPARISON:  Ultrasound guided cholecystostomy tube placement - 03/06/2014; CT abdomen pelvis -03/06/2014  CONTRAST:  1mL OMNIPAQUE IOHEXOL 300 MG/ML  SOLN  FLUOROSCOPY TIME:  dictate in minutes & seconds  TECHNIQUE: Patient was positioned supine on the fluoroscopy table. Preprocedural spot fluoroscopic image was obtained of the right upper abdominal quadrant existing cholecystostomy tube.  Multiple fluoroscopic and radiographic images were obtained in various obliquities following the injection of a small amount of contrast via the existing cholecystostomy tube.  Images were reviewed and the cholecystostomy tube was flushed with a small amount of saline and capped.  FINDINGS: Preprocedural  spot fluoroscopic image demonstrates unchanged positioning of the cholecystostomy tube with end coiled and locked overlying expected location of the gallbladder fossa.  Contrast injection demonstrates appropriate positioning and functioning of the existing cholecystostomy tube. There is brisk passage of contrast through the gallbladder with opacification of the cystic and common bile ducts. There is brisk passage of contrast through the biliary system to the level of the duodenum.  There is a persistent ill-defined filling defect within the gallbladder fundus which correlates with the gallstones seen on remote abdominal CT obtained 03/06/2014. There no discrete filling defects in the opacified portions of the biliary tree to suggest choledocholithiasis.  IMPRESSION: 1. Appropriately positioned and functioning cholecystostomy tube. No exchange performed. 2. Cholelithiasis without evidence of choledocholithiasis.  PLAN: The patient's cholecystostomy  tube was capped for a trial of internalization.  Patient will return to the [facility] next Tuesday (7/12) for repeat percutaneous cholangiogram as well as evaluation for potential cholecystostomy tube removal.   Electronically Signed   By: Sandi Mariscal M.D.   On: 07/21/2014 11:17        Scheduled Meds: . amLODipine  10 mg Oral Daily  . aspirin  81 mg Oral Daily  . carvedilol  12.5 mg Oral BID WC  . Chlorhexidine Gluconate Cloth  6 each Topical Q0600  . cholestyramine  4 g Per Tube BID  . doxazosin  1 mg Oral Daily  . fentaNYL  50 mcg Transdermal Q72H  . ferrous sulfate  325 mg Oral Q breakfast  . heparin  5,000 Units Subcutaneous 3 times per day  . insulin aspart  0-9 Units Subcutaneous TID WC  . insulin glargine  15 Units Subcutaneous QHS  . levothyroxine  100 mcg Oral QAC breakfast  . loperamide  2 mg Oral BID  . mirtazapine  15 mg Oral QHS  . multivitamin with minerals  1 tablet Oral Daily  . mupirocin  ointment  1 application Nasal BID  . saccharomyces boulardii  250 mg Oral BID  . tamsulosin  0.4 mg Oral QPC breakfast   Continuous Infusions:    Principal Problem:   AKI (acute kidney injury) Active Problems:   Stage IV pressure ulcer of sacral region   Wound of left lower extremity   CKD (chronic kidney disease) stage 3, GFR 30-59 ml/min   DM type 2, controlled, with complication   Anemia, chronic disease   Hypothyroidism   Chronic pain syndrome   C. difficile diarrhea   Urinary retention   Leukocytosis    Time spent: 40 minutes.    Florencia Reasons, MD, PhD [facility] Pager [phone]  If 7PM-7AM, please contact night-coverage [email or web address] Password [email or web address] 07/22/2014, 6:54 PM    LOS: 3 days

## 2014-07-22 NOTE — Clinical Social Work Note (Signed)
Clinical Social Work Assessment  Patient Details  Name: Gabriel Lynch MRN: [id number] Date of Birth: March 23, 1945  Date of referral:  07/22/14               Reason for consult:  Facility Placement                Permission sought to share information with:  Family Supports Permission granted to share information::  Yes, Verbal Permission Granted  Name::     Gabriel Lynch patient's son  Agency::  SNF admissions  Relationship::     Contact Information:     Housing/Transportation Living arrangements for the past 2 months:  [place] of Information:  Patient Patient Interpreter Needed:  None Criminal Activity/Legal Involvement Pertinent to Current Situation/Hospitalization:  No - Comment as needed Significant Relationships:  Adult Children Lives with:  Facility Resident Do you feel safe going back to the place where you live?  Yes Need for family participation in patient care:  Yes (Comment) (Patient requests family to be involved in discharge planning)  Care giving concerns:  Patient is from [facility], and plans to return.   Social Worker assessment / plan:  Patient is a 69 year old male who was in [facility] then recently discharged to [facility].  Patient was not very talkative, but expressed his frustration that he is back in the hospital.  Patient stated he would like to return back to [place] once he is medically ready.  Patient stated he had family who live nearby, and are involved in his care.  Patient did not want to talk very much, he expressed he just wanted to get better.  Patient is alert and oriented x4 and was able to converse with CSW.  Patient was informed of CSW's role in discharge planning, and asked if he had any other questions, patient stated he did not.  Employment status:  Retired Forensic scientist:  Medicare PT Recommendations:  [place] / Referral to community resources:     Patient/Family's Response  to care:  Patient states he is in agreement to returning to [place].  Patient/Family's Understanding of and Emotional Response to Diagnosis, Current Treatment, and Prognosis:  Patient aware of his diagnosis and his current treatment he understands what his prognosis is.  Emotional Assessment Appearance:  Appears older than stated age Attitude/Demeanor/Rapport:    Affect (typically observed):  Calm, Stable, Appropriate Orientation:  Oriented to Self, Oriented to Place, Oriented to  Time, Oriented to Situation Alcohol / Substance use:  Not Applicable Psych involvement (Current and /or in the community):  No (Comment)  Discharge Needs  Concerns to be addressed:  No discharge needs identified Readmission within the last 30 days:  No Current discharge risk:  None Barriers to Discharge:  No Barriers Identified   Gabriel Lynch, LCSWA 07/22/2014, 2:50 PM

## 2014-07-23 DIAGNOSIS — R627 Adult failure to thrive: Secondary | ICD-10-CM

## 2014-07-23 LAB — CBC
HCT: 26.2 % — ABNORMAL LOW (ref 39.0–52.0)
Hemoglobin: 8 g/dL — ABNORMAL LOW (ref 13.0–17.0)
MCH: 29.3 pg (ref 26.0–34.0)
MCHC: 30.5 g/dL (ref 30.0–36.0)
MCV: 96 fL (ref 78.0–100.0)
PLATELETS: 535 10*3/uL — AB (ref 150–400)
RBC: 2.73 MIL/uL — ABNORMAL LOW (ref 4.22–5.81)
RDW: 19.6 % — ABNORMAL HIGH (ref 11.5–15.5)
WBC: 13.5 10*3/uL — AB (ref 4.0–10.5)

## 2014-07-23 LAB — BASIC METABOLIC PANEL
ANION GAP: 7 (ref 5–15)
BUN: 39 mg/dL — ABNORMAL HIGH (ref 6–20)
CO2: 22 mmol/L (ref 22–32)
CREATININE: 1.89 mg/dL — AB (ref 0.61–1.24)
Calcium: 9.3 mg/dL (ref 8.9–10.3)
Chloride: 107 mmol/L (ref 101–111)
GFR calc Af Amer: 40 mL/min — ABNORMAL LOW (ref >60)
GFR, EST NON AFRICAN AMERICAN: 35 mL/min — AB (ref >60)
GLUCOSE: 70 mg/dL (ref 65–99)
Potassium: 3.7 mmol/L (ref 3.5–5.1)
SODIUM: 136 mmol/L (ref 135–145)

## 2014-07-23 LAB — GLUCOSE, CAPILLARY
GLUCOSE-CAPILLARY: 133 mg/dL — AB (ref 65–99)
GLUCOSE-CAPILLARY: 114 mg/dL — AB (ref 65–99)
Glucose-Capillary: 63 mg/dL — ABNORMAL LOW (ref 65–99)
Glucose-Capillary: 71 mg/dL (ref 65–99)
Glucose-Capillary: 123 mg/dL — ABNORMAL HIGH (ref 65–99)
Glucose-Capillary: 76 mg/dL (ref 65–99)

## 2014-07-23 LAB — PREALBUMIN: Prealbumin: 19 mg/dL (ref 18–38)

## 2014-07-23 LAB — STOOL CULTURE: SPECIAL REQUESTS: NORMAL

## 2014-07-23 LAB — MAGNESIUM: Magnesium: 2 mg/dL (ref 1.7–2.4)

## 2014-07-23 MED ORDER — PRO-STAT SUGAR FREE PO LIQD
30.0000 mL | Freq: Two times a day (BID) | ORAL | Status: DC
  Administered 2014-07-23 – 2014-07-24 (×2): 30 mL via ORAL
  Filled 2014-07-23 (×2): qty 30

## 2014-07-23 MED ORDER — OXYCODONE HCL 5 MG PO TABS
5.0000 mg | ORAL_TABLET | Freq: Four times a day (QID) | ORAL | Status: DC | PRN
  Administered 2014-07-23 – 2014-07-24 (×2): 5 mg via ORAL
  Filled 2014-07-23 (×2): qty 1

## 2014-07-23 NOTE — Consult Note (Addendum)
WOC ostomy follow up Stoma type/location: RLQ ileostomy Stomal assessment/size: viewed through pouch; 1 inch round, red, budded Peristomal assessment: Not seen today Treatment options for stomal/peristomal skin: As noted by my partner, skin barrier ring to be used with next pouch change.  One is left at the bedside for staff use. Output: brown with food particles. Ostomy pouching: 1pc.ostomy pouching system in place. Education provided: Patient is independent in ostomy care with assistance from care providers. It was noted by my partner on 7/3 that the aperture was cut too large the last time and that a 1 inch opening should be used.  Pattern is at bedside with ostomy supplies and a skin barrier ring provided   [place] wound follow up: requested follow-up by; all wounds seen by my partner on 07/19/14  Wound type: two wounds, sacrum and left hand Measurement:per Sunday Wound bed: Unchanged since Sunday Drainage (amount, consistency, odor) Patient notes a somewhat stronger odor from his sacral wound than previously (in facility).  States that [place] is being used there instead of the silver product we are using here.  Both are antimicrobial; we do not use Manuka honey here at [facility], so silver hydrofiber is used.  Patient will return to using [place] honey upon discharge.  No need to change order while in house. Left hand wound dressing changed and there is no change since Sunday. This wound has minor hypergranulation tissue and continues to heal well, but there are no orders for care on the chart.  I have added a silver alginate beneath the silicone foam for hypergranulation today and provided orders for twice weekly changes for Nursing.  Periwound: Intact, dry Dressing procedure/placement/frequency: Continue POC as implemented on Sunday, 7/3.  [place] nursing team will not follow, but will remain available to this patient, the nursing and medical teams.  Please re-consult if needed. Thanks, Maudie Flakes, MSN, RN, [place], [place], [place] [phone])

## 2014-07-23 NOTE — Clinical Social Work Note (Signed)
CSW faxed updated clinicals to [place], CSW to continue to follow patient throughout discharge planning.  Patient plans to return back to [facility] once he is medically ready and discharge orders have been received.  Jones Broom. [place], MSW, [place] 07/23/2014 4:23 PM

## 2014-07-23 NOTE — Progress Notes (Signed)
PROGRESS NOTE    Gabriel Lynch UXN:[id number] DOB: [birth year] DOA: 07/18/2014 PCP: [name], MD  HPI/Brief narrative 69 y.o. male with a complex medical history who was sent from the [facility] due to problems with Urinary retention, and worsening of his wounds along with increased output from his colostomy. He recently completed a course of Oral Vancomycin for C.Diff Colitison 06/27, and shortly thereafter he has had increased watery stool for the past 3-4 days. He denies any nausea or vomiting or and fever or chills.   Of Note patient hada history of Sepsis and Septic Shock with Acute Renal Failure requiring Short Term Dialysis due to complications of Cholecystitis and had been in ICU, then transferred to [facility], then to [facility], and then to [facility] 1 week ago.  Admitted for diarrhea, repeat C. difficile PCR negative, dehydration, acute on chronic kidney disease. General surgery consulted for recommendations regarding percutaneous cholecystostomy. Nephrology following regarding acute on chronic renal failure. GI consulted regarding ongoing increased ostomy output.   Assessment/Plan:  Diarrhea/recently treated C. difficile colitis - Recently completed course of vancomycin for C. difficile colitis on 07/13/14-exact duration of treatment not known. - C. difficile PCR negative - ? Acute GE versus IBS from recent C. Difficile - Discontinue vancomycin. Continue probiotics - Follow stool culture results - GI consulted and have started Questran, better,   Dehydration - Likely due to GI losses - Mild hypercalcemia possibly related to same - s/p bicarbonate drip then ns by nephrology. -off ivf,  Encourage oral intake,   Acute on stage III chronic kidney disease/ NAG metabolic acidosis - Related to GI losses, hemodynamically mediated, possible evolution to ATN - Creatinine on 04/28/14 was 1.28. Admitted with creatinine  of 2.85 - Creatinine increased to 2.9 despite IV fluid hydration. Likely still quite volume depleted. - Nephrology consulted and have increased IV fluids and briefly treated with bicarbonate drip for NAG metabolic acidosis - Renal ultrasound shows chronic medical renal disease without hydronephrosis. - Creatinine improving. Fluids changed to normal saline at increased rate.  -continue to improve, off ivf, Encourage oral intake  Urinary retention - Coud Foley placed in ED - Did not have Foley catheter prior to admission but had one placed remotely per patient - Continue tamsulosin, minimize opiods - May need to discharge on fully catheter with outpatient consultation with urology -patient is reluctant to do voiding trial today, will try in am  Multiple wounds-stage IV sacral pressure ulcer/sacral osteomyelitis, healed skin tears/avulsion dorsum of left hand, healed ulcer of left pretibial region - WOC consultation 7/3 appreciated and management per them. -reconsult WOC on 7/6, appreciate input.  Uncontrolled type II DM - Too tight CBG control. Hypoglycemic range CBG this morning. - Reduce Lantus  Anemia of chronic disease - Follow CBCs - Gradually dropping and 8.5 today. Follow CBC daily and transfuse if hemoglobin less than 7 g per DL. - Hemoglobin stable over last 24 hours.  Hypothyroid - Continue Synthroid  Chronic pain syndrome - Pain management  Cholecystitis, status post percutaneous cholecystostomy - Stable.? Further plans - As per discharge summary 03/10/14, percutaneous cholecystostomy tube was supposed to be in place for 6 weeks followed by surgical referral for cholecystectomy. - Surgical consultation appreciated. Cholangiogram via tube on 7/5 in IR. We will then determine if needs replacement tube versus pull tube versus cholecystectomy. - As per surgery: Cholangiogram was patent. Tube clamped and in 1 week if tolerating clamp, IR will repeat cholangiogram and take  out  the tube. If he doesn't do well with it clamped, may need cholecystectomy versus continuing drain. If discharged, we will need follow-up with IR.  Essential hypertension - Continue amlodipine, carvedilol and Doxazosin - Controlled  Left mandibular angle superficial soft tissue mass 1.6 x 1.3 cm-likely sebaceous cyst - As per follow up recommendations during previous discharge 03/10/14, outpatient workup.  History of ulcerative colitis status post subtotal colectomy and ileostomy January 2016 - on no ulcerative colitis meds  Failure to thrive  Thrombocytosis - Possibly reactive    DVT prophylaxis: Lovenox Code Status: Full Family Communication: Discussed with patient's brother at bedside on 7/4 and 7/6 Disposition Plan: SNF when medically stable   Consultants:  [facility]  Nephrology  GI  Procedures:  Coud Foley catheter 07/19/14  Antibiotics:  Oral vancomycin-DC'd   Subjective: diarrhea seems has stopped. C/o sacral pain. Want to keep foley in place. Reluctant to work with physical therapy  Objective: Filed Vitals:   07/22/14 2223 07/23/14 2841 07/23/14 0844 07/23/14 1207  BP: 128/62 131/66 124/64 122/63  Pulse: 58 62 63   Temp: 98.7 F (37.1 C) 98.5 F (36.9 C)    TempSrc: Oral Oral    Resp: 18 16    Height:      Weight:      SpO2: 99% 98%      Intake/Output Summary (Last 24 hours) at 07/23/14 1331 Last data filed at 07/23/14 0639  Gross per 24 hour  Intake   2565 ml  Output   2825 ml  Net   -260 ml   Filed Weights   07/19/14 0236  Weight: 68.72 kg (151 lb 8 oz)     Exam:  General exam: Moderately built and frail chronically ill-looking male lying comfortably supine in bed. Respiratory system: Clear. No increased work of breathing. Cardiovascular system: S1 & S2 heard, RRR. No JVD, murmurs, gallops, clicks or pedal edema. Gastrointestinal system: Abdomen is nondistended, soft and nontender. Normal bowel sounds heard. Percutaneous  cholecystostomy site drain clamped. Colostomy draining more formed stools. Indwelling Foley catheter. Central nervous system: Alert and oriented. No focal neurological deficits. Extremities: Symmetric 5 x 5 power. Skin: Detailed exam as per [place] consultation on 7/3. Approximately 5 cm diameter mid-sacral stage IV clean ulcer without signs of infection. Dressing over left dorsal hand and left shin clean, dry and intact. Wound care re consulted on 7/7, wound stable.   Data Reviewed: Basic Metabolic Panel:  Recent Labs Lab 07/19/14 0445 07/20/14 0331 07/21/14 0430 07/22/14 0434 07/23/14 0532  NA 136 133* 133* 138 136  K 3.7 4.1 3.2* 3.7 3.7  CL 108 108 104 109 107  CO2 17* 15* 21* 23 22  GLUCOSE 166* 133* 95 70 70  BUN 52* 60* 70* 53* 39*  CREATININE 2.86* 2.96* 2.63* 2.12* 1.89*  CALCIUM 10.5* 9.7 10.0 9.4 9.3  MG  --   --   --  1.6* 2.0  PHOS  --   --  1.5* 2.7  --    Liver Function Tests:  Recent Labs Lab 07/20/14 0331 07/21/14 0430 07/22/14 0434  AST 21  --   --   ALT 28  --   --   ALKPHOS 167*  --   --   BILITOT 0.3  --   --   PROT 7.3  --   --   ALBUMIN 2.5* 2.5* 2.1*   No results for input(s): LIPASE, AMYLASE in the last 168 hours. No results for input(s):  AMMONIA in the last 168 hours. CBC:  Recent Labs Lab 07/18/14 2218 07/19/14 0445 07/20/14 0331 07/21/14 0430 07/23/14 0532  WBC 14.6* 15.2* 13.1* 15.3* 13.5*  NEUTROABS 8.7*  --   --   --   --   HGB 9.9* 9.7* 8.5* 8.5* 8.0*  HCT 31.9* 31.6* 27.0* 26.8* 26.2*  MCV 95.5 94.6 96.8 95.4 96.0  PLT 723* 597* 558* 577* 535*   Cardiac Enzymes: No results for input(s): CKTOTAL, CKMB, CKMBINDEX, TROPONINI in the last 168 hours. BNP (last 3 results) No results for input(s): PROBNP in the last 8760 hours. CBG:  Recent Labs Lab 07/22/14 1203 07/22/14 1714 07/22/14 2145 07/23/14 0735 07/23/14 0803  GLUCAP 114* 113* 118* 63* 71    Recent Results (from the past 240 hour(s))  Clostridium Difficile by  PCR (not at [facility])     Status: None   Collection Time: 07/18/14  8:58 PM  Result Value Ref Range Status   C difficile by pcr NEGATIVE NEGATIVE Final  Stool culture     Status: None   Collection Time: 07/19/14  5:37 AM  Result Value Ref Range Status   Specimen Description STOOL  Final   Special Requests Normal  Final   Culture   Final    NO SALMONELLA, SHIGELLA, CAMPYLOBACTER, YERSINIA, OR E.COLI 0157:H7 ISOLATED Performed at [facility]    Report Status 07/23/2014 FINAL  Final  MRSA PCR Screening     Status: Abnormal   Collection Time: 07/19/14  1:54 PM  Result Value Ref Range Status   MRSA by PCR POSITIVE (A) NEGATIVE Final    Comment:        The GeneXpert MRSA Assay (FDA approved for NASAL specimens only), is one component of a comprehensive MRSA colonization surveillance program. It is not intended to diagnose MRSA infection nor to guide or monitor treatment for MRSA infections. RESULT CALLED TO, READ BACK BY AND VERIFIED WITH: ESTERA,S RN 07/19/14 1951 Arjay        Studies: No results found.      Scheduled Meds: . amLODipine  10 mg Oral Daily  . aspirin  81 mg Oral Daily  . carvedilol  12.5 mg Oral BID WC  . Chlorhexidine Gluconate Cloth  6 each Topical Q0600  . cholestyramine  4 g Per Tube BID  . fentaNYL  50 mcg Transdermal Q72H  . ferrous sulfate  325 mg Oral Q breakfast  . heparin  5,000 Units Subcutaneous 3 times per day  . insulin aspart  0-9 Units Subcutaneous TID WC  . insulin glargine  15 Units Subcutaneous QHS  . levothyroxine  100 mcg Oral QAC breakfast  . loperamide  2 mg Oral BID  . mirtazapine  15 mg Oral QHS  . multivitamin with minerals  1 tablet Oral Daily  . mupirocin ointment  1 application Nasal BID  . saccharomyces boulardii  250 mg Oral BID  . tamsulosin  0.4 mg Oral QPC breakfast   Continuous Infusions:    Principal Problem:   AKI (acute kidney injury) Active Problems:   Stage IV pressure ulcer of sacral region    Wound of left lower extremity   CKD (chronic kidney disease) stage 3, GFR 30-59 ml/min   DM type 2, controlled, with complication   Anemia, chronic disease   Hypothyroidism   Chronic pain syndrome   C. difficile diarrhea   Urinary retention   Leukocytosis   Diarrhea    Time spent: 40 minutes.    Alanmichael Barmore, MD,  PhD [facility] Pager [phone]  If 7PM-7AM, please contact night-coverage [email or web address] Password [email or web address] 07/23/2014, 1:31 PM    LOS: 4 days

## 2014-07-23 NOTE — Progress Notes (Signed)
Initial Nutrition Assessment  DOCUMENTATION CODES:  Not applicable  INTERVENTION:  -30 ml Prostat BID -MVI daily  NUTRITION DIAGNOSIS:  Increased nutrient needs related to wound healing as evidenced by estimated needs.   GOAL:  Patient will meet greater than or equal to 90% of their needs   MONITOR:  PO intake, Supplement acceptance, Labs, Weight trends, Skin, I & O's  REASON FOR ASSESSMENT:  Consult Assessment of nutrition requirement/status  ASSESSMENT: 69 y.o. male with a complex medical history who was sent from the [facility] SNF due to problems with Urinary retention, and worsening of his wounds along with increased output from his colostomy. He recently completed a course of Oral Vancomycin for C.Diff Colitison 06/27, and shortly thereafter he has had increased watery stool for the past 3-4 days. He denies any nausea or vomiting or and fever or chills.   Pt admitted with diarrhea, dehydration, and recently treated C-diff.   Pt being assisted by nurse tech at time of visit.  Appetite has been good. Meal completion 75-100%. Noted mild wt gain over the past 5 months. Unable to complete nutrition-focused physical exam at this time.   Reviewed [facility] note on 07/23/14. Following for RLQ ileostomy (noted 100 ml output recorded in the past 24 hours per doc flowsheets). Pt also with stage IV sacral wound (healing well with hypergranulation tissue) and left hand wound.   CSW following. Plan is to return to [facility] once medically stable.   Height:  Ht Readings from Last 1 Encounters:  07/19/14 6\' 2"  (1.88 m)    Weight:  Wt Readings from Last 1 Encounters:  07/19/14 151 lb 8 oz (68.72 kg)    Ideal Body Weight:  86.4 kg  Wt Readings from Last 10 Encounters:  07/19/14 151 lb 8 oz (68.72 kg)  03/10/14 145 lb 11.2 oz (66.089 kg)    BMI:  Body mass index is 19.44 kg/(m^2).  Estimated Nutritional Needs:  Kcal:  2000-2200  Protein:  105-120  grams  Fluid:  2.0-2.2 L  Skin:  Wound (see comment) (open wounds on lt lower calf and lt lower arm, stage IV sacrum)  Diet Order:  Diet heart healthy/carb modified Room service appropriate?: Yes; Fluid consistency:: Thin  EDUCATION NEEDS:  No education needs identified at this time   Intake/Output Summary (Last 24 hours) at 07/23/14 1457 Last data filed at 07/23/14 1404  Gross per 24 hour  Intake   2205 ml  Output   3725 ml  Net  -1520 ml    Last BM:  07/23/14  Malayia Spizzirri A. Jimmye Norman, RD, LDN, CDE Pager: [phone] After hours Pager: [phone]

## 2014-07-23 NOTE — Progress Notes (Signed)
Patient ID: Gabriel Lynch, male   DOB: May 03, 1945, 70 y.o.   MRN: [id number]   [facility] Progress Note    Assessment/ Plan:   1. Acute renal failure on chronic kidney disease stage III: Appears to have been hemodynamically mediated acute on chronic renal failure-possible evolution to ATN (urine sodium <10, FeNa <1%). Renal function appears to be improving and he remains nonoliguric. Now off IV fluids--monitor labs daily. 2. Anion gap metabolic acidosis: Secondary to acute renal failure with ongoing GI losses-corrected with isotonic sodium bicarbonate mL appears to be improving with improving renal function. 3. Increased colostomy output: Recently treated for C. difficile colitis-recheck negative for C. difficile PCR. Colostomy output reduced with Imodium-continue to monitor 4. History of cholecystitis status post percutaneous drainage-ongoing evaluation for possible cholecystectomy 5. Anemia: Appears to be anemia of chronic disease, s/p IV Fe  Renal service signing off- call if further concerns arise   Subjective:   Reports to be feeling better- looking forward to going home soon   Objective:   BP 124/64 mmHg  Pulse 63  Temp(Src) 98.5 F (36.9 C) (Oral)  Resp 16  Ht 6\' 2"  (1.88 m)  Wt 68.72 kg (151 lb 8 oz)  BMI 19.44 kg/m2  SpO2 98%  Intake/Output Summary (Last 24 hours) at 07/23/14 1002 Last data filed at 07/23/14 4098  Gross per 24 hour  Intake   2565 ml  Output   3825 ml  Net  -1260 ml   Weight change:   Physical Exam: Gen: Comfortably resting in bed CVS: Pulse regular in rate and rhythm, S1 and S2 normal Resp: Clear to auscultation, no rales Abd: Soft, flat, nontender, colostomy in situ Ext: No lower extremity edema  Imaging: No results found.  Labs: BMET  Recent Labs Lab 07/18/14 2218 07/19/14 0445 07/20/14 0331 07/21/14 0430 07/22/14 0434 07/23/14 0532  NA 135 136 133* 133* 138 136  K 4.1 3.7 4.1 3.2* 3.7 3.7  CL 105 108 108 104 109  107  CO2 20* 17* 15* 21* 23 22  GLUCOSE 150* 166* 133* 95 70 70  BUN 49* 52* 60* 70* 53* 39*  CREATININE 2.85* 2.86* 2.96* 2.63* 2.12* 1.89*  CALCIUM 11.3* 10.5* 9.7 10.0 9.4 9.3  PHOS  --   --   --  1.5* 2.7  --    CBC  Recent Labs Lab 07/18/14 2218 07/19/14 0445 07/20/14 0331 07/21/14 0430 07/23/14 0532  WBC 14.6* 15.2* 13.1* 15.3* 13.5*  NEUTROABS 8.7*  --   --   --   --   HGB 9.9* 9.7* 8.5* 8.5* 8.0*  HCT 31.9* 31.6* 27.0* 26.8* 26.2*  MCV 95.5 94.6 96.8 95.4 96.0  PLT 723* 597* 558* 577* 535*    Medications:    . amLODipine  10 mg Oral Daily  . aspirin  81 mg Oral Daily  . carvedilol  12.5 mg Oral BID WC  . Chlorhexidine Gluconate Cloth  6 each Topical Q0600  . cholestyramine  4 g Per Tube BID  . fentaNYL  50 mcg Transdermal Q72H  . ferrous sulfate  325 mg Oral Q breakfast  . heparin  5,000 Units Subcutaneous 3 times per day  . insulin aspart  0-9 Units Subcutaneous TID WC  . insulin glargine  15 Units Subcutaneous QHS  . levothyroxine  100 mcg Oral QAC breakfast  . loperamide  2 mg Oral BID  . mirtazapine  15 mg Oral QHS  . multivitamin with minerals  1 tablet Oral Daily  .  mupirocin ointment  1 application Nasal BID  . saccharomyces boulardii  250 mg Oral BID  . tamsulosin  0.4 mg Oral QPC breakfast   Elmarie Shiley, MD 07/23/2014, 10:02 AM

## 2014-07-23 NOTE — Care Management (Signed)
UR updated.  

## 2014-07-23 NOTE — Progress Notes (Signed)
Subjective: Pt doing well.  Tol PO with no abd pain.  Objective: Vital signs in last 24 hours: Temp:  [98.5 F (36.9 C)-98.9 F (37.2 C)] 98.5 F (36.9 C) (07/07 6834) Pulse Rate:  [58-77] 62 (07/07 0632) Resp:  [16-18] 16 (07/07 0632) BP: (119-153)/(57-71) 131/66 mmHg (07/07 0632) SpO2:  [98 %-99 %] 98 % (07/07 0632) Last BM Date: 07/22/14  Intake/Output from previous day: 07/06 0701 - 07/07 0700 In: 2925 [P.O.:1560; I.V.:1365] Out: 3825 [Urine:3725; Stool:100] Intake/Output this shift:    General appearance: alert and cooperative GI: soft, non-tender; bowel sounds normal; no masses,  no organomegaly  Lab Results:   Recent Labs  07/21/14 0430 07/23/14 0532  WBC 15.3* 13.5*  HGB 8.5* 8.0*  HCT 26.8* 26.2*  PLT 577* 535*   BMET  Recent Labs  07/22/14 0434 07/23/14 0532  NA 138 136  K 3.7 3.7  CL 109 107  CO2 23 22  GLUCOSE 70 70  BUN 53* 39*  CREATININE 2.12* 1.89*  CALCIUM 9.4 9.3   PT/INR No results for input(s): LABPROT, INR in the last 72 hours. ABG No results for input(s): PHART, HCO3 in the last 72 hours.  Invalid input(s): PCO2, PO2  Studies/Results: Ir Sinus/fist Tube Chk-non Gi  07/21/2014   CLINICAL DATA:  History of acute pectus cholecystitis, post ultrasound guided cholecystostomy tube placement on 03/06/2014. Patient with complex medical history including recent subtotal colectomy with ileostomy as well as recent diagnosis of C diff.  Please perform fluoroscopic guided cholangiogram via existing cholecystostomy tube to evaluate for patency of the cystic and common bile ducts.  EXAM: SINUS TRACT INJECTION/FISTULOGRAM  COMPARISON:  Ultrasound guided cholecystostomy tube placement - 03/06/2014; CT abdomen pelvis -03/06/2014  CONTRAST:  59mL OMNIPAQUE IOHEXOL 300 MG/ML  SOLN  FLUOROSCOPY TIME:  dictate in minutes & seconds  TECHNIQUE: Patient was positioned supine on the fluoroscopy table. Preprocedural spot fluoroscopic image was obtained of the  right upper abdominal quadrant existing cholecystostomy tube.  Multiple fluoroscopic and radiographic images were obtained in various obliquities following the injection of a small amount of contrast via the existing cholecystostomy tube.  Images were reviewed and the cholecystostomy tube was flushed with a small amount of saline and capped.  FINDINGS: Preprocedural spot fluoroscopic image demonstrates unchanged positioning of the cholecystostomy tube with end coiled and locked overlying expected location of the gallbladder fossa.  Contrast injection demonstrates appropriate positioning and functioning of the existing cholecystostomy tube. There is brisk passage of contrast through the gallbladder with opacification of the cystic and common bile ducts. There is brisk passage of contrast through the biliary system to the level of the duodenum.  There is a persistent ill-defined filling defect within the gallbladder fundus which correlates with the gallstones seen on remote abdominal CT obtained 03/06/2014. There no discrete filling defects in the opacified portions of the biliary tree to suggest choledocholithiasis.  IMPRESSION: 1. Appropriately positioned and functioning cholecystostomy tube. No exchange performed. 2. Cholelithiasis without evidence of choledocholithiasis.  PLAN: The patient's cholecystostomy tube was capped for a trial of internalization.  Patient will return to the [facility] next Tuesday (7/12) for repeat percutaneous cholangiogram as well as evaluation for potential cholecystostomy tube removal.   Electronically Signed   By: Sandi Mariscal M.D.   On: 07/21/2014 11:17    Anti-infectives: Anti-infectives    Start     Dose/Rate Route Frequency Ordered Stop   08/17/14 1000  vancomycin (VANCOCIN) 50 mg/mL oral solution 125 mg  Status:  Discontinued     125 mg Oral Every 3 DAYS 07/19/14 0224 07/19/14 1300   08/09/14 1000  vancomycin (VANCOCIN) 50 mg/mL oral  solution 125 mg  Status:  Discontinued     125 mg Oral Every other day 07/19/14 0224 07/19/14 1300   08/02/14 1000  vancomycin (VANCOCIN) 50 mg/mL oral solution 125 mg  Status:  Discontinued     125 mg Oral Daily 07/19/14 0224 07/19/14 1300   07/26/14 1000  vancomycin (VANCOCIN) 50 mg/mL oral solution 125 mg  Status:  Discontinued     125 mg Oral 2 times daily 07/19/14 0224 07/19/14 1300   07/19/14 1000  vancomycin (VANCOCIN) 50 mg/mL oral solution 125 mg  Status:  Discontinued     125 mg Oral 4 times daily 07/19/14 0224 07/19/14 1300      Assessment/Plan: Prior cholecystitis/cholelithiaisis with perc chole since February 2016 -Pt tol PO with Cholecystostomy clamped.  Restudy next week as per IR. Recent subtotal colectomy with ileostomy Recent c.diff abs course completed 6/27 High ileostomy output/Dehydration -imodium, GI placed on questran as well -output decreased and stable Decubitus ulcers -per WOC RN - 100% moist, pink, beefy red -c/o sacral pain, try air overlay mattress  OK from surgery standpoint to DC from hospital. Will be available for any questions  LOS: 4 days    Rosario Jacks., [facility] 07/23/2014

## 2014-07-24 ENCOUNTER — Encounter (HOSPITAL_COMMUNITY): Payer: Self-pay | Admitting: *Deleted

## 2014-07-24 ENCOUNTER — Other Ambulatory Visit: Payer: Self-pay | Admitting: Radiology

## 2014-07-24 DIAGNOSIS — N183 Chronic kidney disease, stage 3 (moderate): Secondary | ICD-10-CM

## 2014-07-24 DIAGNOSIS — K810 Acute cholecystitis: Secondary | ICD-10-CM

## 2014-07-24 LAB — MAGNESIUM: MAGNESIUM: 1.8 mg/dL (ref 1.7–2.4)

## 2014-07-24 LAB — BASIC METABOLIC PANEL
Anion gap: 7 (ref 5–15)
BUN: 38 mg/dL — AB (ref 6–20)
CALCIUM: 9.2 mg/dL (ref 8.9–10.3)
CO2: 21 mmol/L — ABNORMAL LOW (ref 22–32)
Chloride: 107 mmol/L (ref 101–111)
Creatinine, Ser: 2.02 mg/dL — ABNORMAL HIGH (ref 0.61–1.24)
GFR calc Af Amer: 37 mL/min — ABNORMAL LOW (ref >60)
GFR calc non Af Amer: 32 mL/min — ABNORMAL LOW (ref >60)
GLUCOSE: 126 mg/dL — AB (ref 65–99)
POTASSIUM: 4 mmol/L (ref 3.5–5.1)
Sodium: 135 mmol/L (ref 135–145)

## 2014-07-24 LAB — CBC
HCT: 24.2 % — ABNORMAL LOW (ref 39.0–52.0)
Hemoglobin: 7.7 g/dL — ABNORMAL LOW (ref 13.0–17.0)
MCH: 30.7 pg (ref 26.0–34.0)
MCHC: 31.8 g/dL (ref 30.0–36.0)
MCV: 96.4 fL (ref 78.0–100.0)
Platelets: 513 10*3/uL — ABNORMAL HIGH (ref 150–400)
RBC: 2.51 MIL/uL — AB (ref 4.22–5.81)
RDW: 19.4 % — AB (ref 11.5–15.5)
WBC: 14.5 10*3/uL — AB (ref 4.0–10.5)

## 2014-07-24 LAB — GLUCOSE, CAPILLARY
GLUCOSE-CAPILLARY: 122 mg/dL — AB (ref 65–99)
Glucose-Capillary: 60 mg/dL — ABNORMAL LOW (ref 65–99)
Glucose-Capillary: 136 mg/dL — ABNORMAL HIGH (ref 65–99)

## 2014-07-24 MED ORDER — TAMSULOSIN HCL 0.4 MG PO CAPS: 0.8000 mg | ORAL_CAPSULE | Freq: Every day | ORAL | Status: DC

## 2014-07-24 MED ORDER — INSULIN GLARGINE 100 UNIT/ML ~~LOC~~ SOLN: 8.0000 [IU] | Freq: Every day | SUBCUTANEOUS | Status: DC

## 2014-07-24 MED ORDER — FINASTERIDE 5 MG PO TABS: 5.0000 mg | ORAL_TABLET | Freq: Every day | ORAL | Status: DC

## 2014-07-24 MED ORDER — INSULIN GLARGINE 100 UNIT/ML ~~LOC~~ SOLN
8.0000 [IU] | Freq: Every day | SUBCUTANEOUS | Status: DC
  Filled 2014-07-24: qty 0.08

## 2014-07-24 MED ORDER — FENTANYL 50 MCG/HR TD PT72: 50.0000 ug | MEDICATED_PATCH | TRANSDERMAL | Status: DC

## 2014-07-24 MED ORDER — FINASTERIDE 5 MG PO TABS
5.0000 mg | ORAL_TABLET | Freq: Every day | ORAL | Status: DC
  Administered 2014-07-24: 5 mg via ORAL
  Filled 2014-07-24: qty 1

## 2014-07-24 MED ORDER — TAMSULOSIN HCL 0.4 MG PO CAPS: 0.8000 mg | ORAL_CAPSULE | ORAL | Status: AC

## 2014-07-24 MED ORDER — OXYCODONE HCL 5 MG PO TABS: 5.0000 mg | ORAL_TABLET | Freq: Four times a day (QID) | ORAL | Status: DC | PRN

## 2014-07-24 MED ORDER — CHOLESTYRAMINE 4 G PO PACK: 4.0000 g | PACK | Freq: Every day | ORAL | Status: AC

## 2014-07-24 NOTE — Clinical Social Work Note (Signed)
Patient to be d/c'ed today to [facility] SNF/[facility].  Patient and family agreeable to plans will transport via ems RN to call report.  CSW notified patient's sister Lowell Guitar who is aware that patient will be returning to [facility] today.   Evette Cristal, MSW, [place]

## 2014-07-24 NOTE — Progress Notes (Addendum)
Physical Therapy Treatment Patient Details Name: Gabriel Lynch MRN: [id number] DOB: [birth year] Today's Date: 07/24/2014    History of Present Illness Pt is a 68 y.o. male with a complex medical history who was sent from the [facility] SNF due to problems with Urinary retention, and worsening of his wounds along with increased output from his colostomy. Of Note patient had A history of Sepsis and Septic Shock with Acute Renal Failure requiring Short Term Dialysis due to complications of Cholecystitis and had been in ICU, then transferred to [facility], then to [facility] for [place], and then to [facility] towards the last week of June 2016.    PT Comments    Pt stood at EOB x 2 minutes with RW. Despite much encouragement, he refused to attempt ambulation or exercises. Sacral pain limiting tolerance to activity. Encouraged frequent position changes to relieve pressure to sacral while in bed.   Follow Up Recommendations  SNF     Equipment Recommendations  None recommended by PT (TBA)    Recommendations for Other Services       Precautions / Restrictions Precautions Precautions: Fall Restrictions Weight Bearing Restrictions: No    Mobility  Bed Mobility Overal bed mobility: Needs Assistance Bed Mobility: Rolling;Sidelying to Sit;Sit to Sidelying Rolling: Supervision Sidelying to sit: Min assist     Sit to sidelying: Modified independent (Device/Increase time) General bed mobility comments: use of rails for mobility, cues for side to sit due to h/o abdominal surgery  Transfers Overall transfer level: Needs assistance Equipment used: Rolling walker (2 wheeled) Transfers: Sit to/from Stand Sit to Stand: Min assist;From elevated surface         General transfer comment: increased time and much encouragement needed, pt stood for 2 minutes with RW without LOB, he refused to walk or try standing LE exercises.   Ambulation/Gait              General Gait Details: pt refused   Stairs            Wheelchair Mobility    Modified Rankin (Stroke Patients Only)       Balance     Sitting balance-Leahy Scale: Good     Standing balance support: Bilateral upper extremity supported Standing balance-Leahy Scale: Poor Standing balance comment: BUE support needed in standing                    Cognition Arousal/Alertness: Awake/alert Behavior During Therapy: Anxious Overall Cognitive Status: Within Functional Limits for tasks assessed                      Exercises      General Comments        Pertinent Vitals/Pain Faces Pain Scale: Hurts whole lot Pain Location: scacrum  Pain Descriptors / Indicators: Sore Pain Intervention(s): Limited activity within patient's tolerance;Monitored during session;Patient requesting pain meds-RN notified;Repositioned    Home Living                      Prior Function            PT Goals (current goals can now be found in the care plan section) Acute Rehab PT Goals Patient Stated Goal: "I just want to get back home." PT Goal Formulation: With patient Time For Goal Achievement: 08/05/14 Potential to Achieve Goals: Fair Progress towards PT goals: Not progressing toward goals - comment (pt refused to try walking)    Frequency  Min 3X/week    PT Plan Current plan remains appropriate    Co-evaluation             End of Session   Activity Tolerance: Patient limited by fatigue;Patient limited by pain Patient left: in bed;with call bell/phone within reach;with nursing/sitter in room     Time: 1130-1143 PT Time Calculation (min) (ACUTE ONLY): 13 min  Charges:  $Therapeutic Activity: 8-22 mins                    G Codes:      Philomena Doheny 07/24/2014, 11:56 AM [phone]

## 2014-07-24 NOTE — Discharge Summary (Addendum)
Discharge Summary  [facility] SEG:[id number] DOB: 06/18/1945  PCP: [name], MD  Admit date: 07/18/2014 Discharge date: 07/24/2014  Time spent: >61mins  Recommendations for Outpatient Follow-up:  1. F/u with PMD within 2weeks, pmd to repeat cbc/cmp, continue monitor insulin dose, insulin dose reduced at discharge. 2. F/u with interventional radiology on 7/12 at 11am to repeat cholangiogram and possible removal of perc drain. 3. F/u with [facility] [place] [facility] in three weeks for cholecystitis, s/p perc drain 4. F/u with urology within a week for urinary retention.  increased dose of flomax, newly started on proscar. Patient voided 600cc prior to discharge  Discharge Diagnoses:  Active Hospital Problems   Diagnosis Date Noted  . AKI (acute kidney injury)   . Diarrhea   . C. difficile diarrhea 07/19/2014  . Urinary retention 07/19/2014  . Leukocytosis   . CKD (chronic kidney disease) stage 3, GFR 30-59 ml/min 07/13/2014  . DM type 2, controlled, with complication 73/71/0626  . Hypothyroidism 07/13/2014  . Wound of left lower extremity 07/13/2014  . Chronic pain syndrome 07/13/2014  . Anemia, chronic disease 07/13/2014  . Stage IV pressure ulcer of sacral region 03/06/2014    Resolved Hospital Problems   Diagnosis Date Noted Date Resolved  No resolved problems to display.    Discharge Condition: stable  Diet recommendation: heart healthy/carb modified  Filed Weights   07/19/14 0236  Weight: 68.72 kg (151 lb 8 oz)    History of present illness:  Gabriel Lynch is a 69 y.o. male with a complex medical history who was sent from the [facility] SNF due to problems with Urinary retention, and worsening of his wounds along with increased output from his colostomy. He recently completed a course of Oral Vancomycin for C.Diff Colitis onn 06/27, and shortly thereafter he has had increased watery stool for the past 3-4 days. He denies any nausea or vomiting or and fever or  chills.   Of Note patient had A history of Sepsis and Septic Shock with Acute Renal Failure requiring Short Term Dialysis due to complications of Cholecystitis and had been in ICU, then transferred to [facility], then to [facility] for [place], and then to [facility] 1 week ago.   Admitted for diarrhea, repeat C. difficile PCR negative, dehydration, acute on chronic kidney disease. General surgery consulted for recommendations regarding percutaneous cholecystostomy. Nephrology following regarding acute on chronic renal failure. GI consulted regarding ongoing increased ostomy output. Hospital Course:  Principal Problem:   AKI (acute kidney injury) Active Problems:   Stage IV pressure ulcer of sacral region   Wound of left lower extremity   CKD (chronic kidney disease) stage 3, GFR 30-59 ml/min   DM type 2, controlled, with complication   Anemia, chronic disease   Hypothyroidism   Chronic pain syndrome   C. difficile diarrhea   Urinary retention   Leukocytosis   Diarrhea Diarrhea/recently treated C. difficile colitis - Recently completed course of vancomycin for C. difficile colitis on 07/13/14-exact duration of treatment not known. - C. difficile PCR negative this admission - Continue probiotics -  GI consulted and have started Questran, resolved. Consider d/c questran if constipation. -of note kayexalate daily am  listed as home meds, could be one of the cause of diarrhea. This is discontinued.  Dehydration - Likely due to GI losses - Mild hypercalcemia possibly related to same - s/p bicarbonate drip then ns by nephrology. -off ivf, Encourage oral intake,   Acute on stage III chronic kidney  disease/ NAG metabolic acidosis - Related to GI losses, hemodynamically mediated, possible evolution to ATN - Creatinine on 04/28/14 was 1.28. Admitted with creatinine of 2.85 - Creatinine increased to 2.9 despite IV fluid hydration. Likely still  quite volume depleted. - Nephrology consulted and have increased IV fluids and briefly treated with bicarbonate drip for NAG metabolic acidosis - Renal ultrasound shows chronic medical renal disease without hydronephrosis. - Creatinine improving. Fluids changed to normal saline at increased rate.  -improved, back to baseline, off ivf, Encourage oral intake  Urinary retention - Coud Foley placed in ED - increased tamsulosin to 0.8mg  po qd, started proscar, minimize opiods - voided 600cc prior to discharge after foley removal, outpatient follow up with urology in one week.   Multiple wounds-stage IV sacral pressure ulcer/sacral osteomyelitis, healed skin tears/avulsion dorsum of left hand, healed ulcer of left pretibial region - WOC consultation 7/3 appreciated and management per them. -reconsult WOC on 7/6, wound appear stable, appreciate woc  input.  Uncontrolled type II DM - Too tight CBG control. Hypoglycemic range CBG in the morning. - Reduce Lantus, pmd to continue monitor insulin dose. May need further reduction.  Anemia of chronic disease - Follow CBCs - Gradually dropping and 8.5 today. Follow CBC daily and transfuse if hemoglobin less than 7 g per DL. - Hemoglobin stable over last 24 hours.  Hypothyroid - Continue Synthroid  Chronic pain syndrome - Pain management, explained to patient try to minimize opioids pain meds to avoid urinary retention.  Cholecystitis, status post percutaneous cholecystostomy - Stable.? Further plans - As per discharge summary 03/10/14, percutaneous cholecystostomy tube was supposed to be in place for 6 weeks followed by surgical referral for cholecystectomy. - Surgical consultation appreciated. Cholangiogram via tube on 7/5 in IR. We will then determine if needs replacement tube versus pull tube versus cholecystectomy. - As per surgery: Cholangiogram was patent. Tube clamped and  IR will repeat cholangiogram and possible take out the tube. If he  doesn't do well with it clamped, may need cholecystectomy versus continuing drain.  follow-up with [facility] and [facility] [place] [facility]  Essential hypertension - Continue amlodipine, carvedilol and Doxazosin - Controlled  Left mandibular angle superficial soft tissue mass 1.6 x 1.3 cm-likely sebaceous cyst - As per follow up recommendations during previous discharge 03/10/14, outpatient workup.  History of ulcerative colitis status post subtotal colectomy and ileostomy January 2016 - on no ulcerative colitis meds  Failure to thrive   Thrombocytosis - Possibly reactive    DVT prophylaxis: Lovenox Code Status: Full Family Communication: Discussed with patient's brother at bedside on 7/4 and 7/6, updated patient's sister ms Lowell Guitar at [phone] prior to discharge. Disposition Plan: SNF when medically stable   Consultants:  [facility]  Nephrology  GI  Procedures:  Coud Foley catheter 07/19/14  Cholangiogram 7/5  Clamping of perc drain (cholecystostomy)  Antibiotics:  Oral vancomycin-DC'd   Discharge Exam: BP 131/64 mmHg  Pulse 59  Temp(Src) 99.3 F (37.4 C) (Oral)  Resp 18  Ht 6\' 2"  (1.88 m)  Wt 68.72 kg (151 lb 8 oz)  BMI 19.44 kg/m2  SpO2 99%  General: NAD Cardiovascular: RRR Respiratory: CTABL Ab: perc drain clamped, colostomy bag with solid brown stool  Discharge Instructions You were cared for by a hospitalist during your hospital stay. If you have any questions about your discharge medications or the care you received while you were in the hospital after you are discharged, you can call the unit  and asked to speak with the hospitalist on call if the hospitalist that took care of you is not available. Once you are discharged, your primary care physician will handle any further medical issues. Please note that NO REFILLS for any discharge medications will be authorized once you are discharged, as it is  imperative that you return to your primary care physician (or establish a relationship with a primary care physician if you do not have one) for your aftercare needs so that they can reassess your need for medications and monitor your lab values.      Discharge Instructions    Diet - low sodium heart healthy    Complete by:  As directed      Increase activity slowly    Complete by:  As directed             Medication List    STOP taking these medications        doxazosin 1 MG tablet  Commonly known as:  CARDURA     HYDROmorphone 2 MG tablet  Commonly known as:  DILAUDID     sodium polystyrene 15 GM/60ML suspension  Commonly known as:  KAYEXALATE      TAKE these medications        amLODipine 10 MG tablet  Commonly known as:  NORVASC  Take 10 mg by mouth daily.     aspirin 81 MG tablet  Take 81 mg by mouth daily.     calcium acetate 667 MG capsule  Commonly known as:  PHOSLO  Take 667 mg by mouth daily.     carvedilol 12.5 MG tablet  Commonly known as:  COREG  Take 12.5 mg by mouth 2 (two) times daily with a meal.     cholestyramine 4 G packet  Commonly known as:  QUESTRAN  Take 1 packet (4 g total) by mouth daily.     feeding supplement (PRO-STAT SUGAR FREE 64) Liqd  Take 30 mLs by mouth 2 (two) times daily.     fentaNYL 50 MCG/HR  Commonly known as:  DURAGESIC - dosed mcg/hr  Place 1 patch (50 mcg total) onto the skin every 3 (three) days.     ferrous sulfate 325 (65 FE) MG tablet  Take 325 mg by mouth daily with breakfast.     finasteride 5 MG tablet  Commonly known as:  PROSCAR  Take 1 tablet (5 mg total) by mouth daily.     insulin glargine 100 UNIT/ML injection  Commonly known as:  LANTUS  Inject 0.08 mLs (8 Units total) into the skin at bedtime.     insulin lispro 100 UNIT/ML injection  Commonly known as:  HUMALOG  Inject 0-15 Units into the skin 3 (three) times daily before meals. Sliding scale     levothyroxine 100 MCG tablet  Commonly  known as:  SYNTHROID, LEVOTHROID  Take 100 mcg by mouth daily before breakfast.     mirtazapine 15 MG tablet  Commonly known as:  REMERON  Take 15 mg by mouth at bedtime.     multivitamin with minerals tablet  Take 1 tablet by mouth daily.     omeprazole 20 MG capsule  Commonly known as:  PRILOSEC  Take 20 mg by mouth daily.     ondansetron 4 MG tablet  Commonly known as:  ZOFRAN  Take 4 mg by mouth every 4 (four) hours as needed for nausea or vomiting.     OVER THE COUNTER MEDICATION  Take 120 mLs  by mouth 2 (two) times daily. Med pass     oxyCODONE 5 MG immediate release tablet  Commonly known as:  Oxy IR/ROXICODONE  Take 1 tablet (5 mg total) by mouth every 6 (six) hours as needed for moderate pain.     PROBIOTIC DAILY Caps  Take 1 capsule by mouth daily.     tamsulosin 0.4 MG Caps capsule  Commonly known as:  FLOMAX  Take 2 capsules (0.8 mg total) by mouth every other day.       Allergies  Allergen Reactions  . Influenza Vaccines Anaphylaxis    ON MAR  . Other Other (See Comments)    Seasonal (sinus) allergies   Follow-up Information    Follow up with [facility] On 07/28/2014.   Specialty:  Radiology   Why:  11am to repeat cholangiogram and possible removal of  perc drain,    Contact information:   [address] [id number] [place] [phone]      Follow up with [name], MD In 2 weeks.   Specialty:  Internal Medicine   Why:  hospital discharge follow up, repeat cbc/cmp   Contact information:   [address] [place] [address] [phone]       Follow up with MACDIARMID,SCOTT A, MD In 1 week.   Specialty:  Urology   Why:  urinary retention   Contact information:   [address] [place] [address] [phone]       Follow up with [facility] In 3 weeks.   Specialty:  General Surgery   Why:  Cholecystitis, status post percutaneous  cholecystostomy   Contact information:   [place] [address] [phone]        The results of significant diagnostics from this hospitalization (including imaging, microbiology, ancillary and laboratory) are listed below for reference.    Significant Diagnostic Studies: Ir Sinus/fist Tube Chk-non Gi  07/21/2014   CLINICAL DATA:  History of acute pectus cholecystitis, post ultrasound guided cholecystostomy tube placement on 03/06/2014. Patient with complex medical history including recent subtotal colectomy with ileostomy as well as recent diagnosis of C diff.  Please perform fluoroscopic guided cholangiogram via existing cholecystostomy tube to evaluate for patency of the cystic and common bile ducts.  EXAM: SINUS TRACT INJECTION/FISTULOGRAM  COMPARISON:  Ultrasound guided cholecystostomy tube placement - 03/06/2014; CT abdomen pelvis -03/06/2014  CONTRAST:  70mL OMNIPAQUE IOHEXOL 300 MG/ML  SOLN  FLUOROSCOPY TIME:  dictate in minutes & seconds  TECHNIQUE: Patient was positioned supine on the fluoroscopy table. Preprocedural spot fluoroscopic image was obtained of the right upper abdominal quadrant existing cholecystostomy tube.  Multiple fluoroscopic and radiographic images were obtained in various obliquities following the injection of a small amount of contrast via the existing cholecystostomy tube.  Images were reviewed and the cholecystostomy tube was flushed with a small amount of saline and capped.  FINDINGS: Preprocedural spot fluoroscopic image demonstrates unchanged positioning of the cholecystostomy tube with end coiled and locked overlying expected location of the gallbladder fossa.  Contrast injection demonstrates appropriate positioning and functioning of the existing cholecystostomy tube. There is brisk passage of contrast through the gallbladder with opacification of the cystic and common bile ducts. There is brisk passage of contrast through the biliary  system to the level of the duodenum.  There is a persistent ill-defined filling defect within the gallbladder fundus which correlates with the gallstones seen on remote  abdominal CT obtained 03/06/2014. There no discrete filling defects in the opacified portions of the biliary tree to suggest choledocholithiasis.  IMPRESSION: 1. Appropriately positioned and functioning cholecystostomy tube. No exchange performed. 2. Cholelithiasis without evidence of choledocholithiasis.  PLAN: The patient's cholecystostomy tube was capped for a trial of internalization.  Patient will return to the [facility] next Tuesday (7/12) for repeat percutaneous cholangiogram as well as evaluation for potential cholecystostomy tube removal.   Electronically Signed   By: Sandi Mariscal M.D.   On: 07/21/2014 11:17   US Renal  07/20/2014   CLINICAL DATA:  Acute renal failure  EXAM: RENAL / URINARY TRACT ULTRASOUND COMPLETE  COMPARISON:  CT 03/06/2014  FINDINGS: Right Kidney:  Length: 12.4 cm. Increased echotexture throughout the right kidney. 2.2 cm lower pole cyst. No hydronephrosis.  Left Kidney:  Length: 11.9 cm. Increased echotexture throughout the left Kidney. No focal abnormality. No hydronephrosis.  Bladder:  Appears normal for degree of bladder distention.  Incidentally noted are gallstones within the gallbladder.  IMPRESSION: Increased echotexture within the kidneys compatible with chronic medical renal disease. No hydronephrosis.  Cholelithiasis.   Electronically Signed   By: Rolm Baptise M.D.   On: 07/20/2014 14:20    Microbiology: Recent Results (from the past 240 hour(s))  Clostridium Difficile by PCR (not at [facility])     Status: None   Collection Time: 07/18/14  8:58 PM  Result Value Ref Range Status   C difficile by pcr NEGATIVE NEGATIVE Final  Stool culture     Status: None   Collection Time: 07/19/14  5:37 AM  Result Value Ref Range Status   Specimen Description STOOL  Final    Special Requests Normal  Final   Culture   Final    NO SALMONELLA, SHIGELLA, CAMPYLOBACTER, YERSINIA, OR E.COLI 0157:H7 ISOLATED Performed at [facility]    Report Status 07/23/2014 FINAL  Final  MRSA PCR Screening     Status: Abnormal   Collection Time: 07/19/14  1:54 PM  Result Value Ref Range Status   MRSA by PCR POSITIVE (A) NEGATIVE Final    Comment:        The GeneXpert MRSA Assay (FDA approved for NASAL specimens only), is one component of a comprehensive MRSA colonization surveillance program. It is not intended to diagnose MRSA infection nor to guide or monitor treatment for MRSA infections. RESULT CALLED TO, READ BACK BY AND VERIFIED WITH: ESTERA,S RN 07/19/14 1951 [place]      Labs: Basic Metabolic Panel:  Recent Labs Lab 07/20/14 0331 07/21/14 0430 07/22/14 0434 07/23/14 0532 07/24/14 0420  NA 133* 133* 138 136 135  K 4.1 3.2* 3.7 3.7 4.0  CL 108 104 109 107 107  CO2 15* 21* 23 22 21*  GLUCOSE 133* 95 70 70 126*  BUN 60* 70* 53* 39* 38*  CREATININE 2.96* 2.63* 2.12* 1.89* 2.02*  CALCIUM 9.7 10.0 9.4 9.3 9.2  MG  --   --  1.6* 2.0 1.8  PHOS  --  1.5* 2.7  --   --    Liver Function Tests:  Recent Labs Lab 07/20/14 0331 07/21/14 0430 07/22/14 0434  AST 21  --   --   ALT 28  --   --   ALKPHOS 167*  --   --   BILITOT 0.3  --   --   PROT 7.3  --   --   ALBUMIN 2.5* 2.5* 2.1*   No results for input(s): LIPASE,  AMYLASE in the last 168 hours. No results for input(s): AMMONIA in the last 168 hours. CBC:  Recent Labs Lab 07/18/14 2218 07/19/14 0445 07/20/14 0331 07/21/14 0430 07/23/14 0532 07/24/14 0420  WBC 14.6* 15.2* 13.1* 15.3* 13.5* 14.5*  NEUTROABS 8.7*  --   --   --   --   --   HGB 9.9* 9.7* 8.5* 8.5* 8.0* 7.7*  HCT 31.9* 31.6* 27.0* 26.8* 26.2* 24.2*  MCV 95.5 94.6 96.8 95.4 96.0 96.4  PLT 723* 597* 558* 577* 535* 513*   Cardiac Enzymes: No results for input(s): CKTOTAL, CKMB, CKMBINDEX, TROPONINI in the last 168  hours. BNP: BNP (last 3 results) No results for input(s): BNP in the last 8760 hours.  ProBNP (last 3 results) No results for input(s): PROBNP in the last 8760 hours.  CBG:  Recent Labs Lab 07/23/14 1643 07/23/14 2114 07/24/14 0713 07/24/14 0857 07/24/14 1143  GLUCAP 76 114* 60* 122* 136*       Signed:  Jaylissa Felty MD, PhD  [facility] 07/24/2014, 12:46 PM

## 2014-07-25 NOTE — Progress Notes (Signed)
Late entry: Patient was discharged from room 6N03 at 1400 on 07/24/2014. Alert and in stable condition. IV site d/c'd. Report given to receiving nurse Arville Go at [facility] re[facility].Transported via stretcher by [facility] with all belongings at side.

## 2014-07-27 ENCOUNTER — Non-Acute Institutional Stay (SKILLED_NURSING_FACILITY): Payer: Medicare Other | Admitting: Internal Medicine

## 2014-07-27 ENCOUNTER — Encounter: Payer: Self-pay | Admitting: Internal Medicine

## 2014-07-27 DIAGNOSIS — E034 Atrophy of thyroid (acquired): Secondary | ICD-10-CM

## 2014-07-27 DIAGNOSIS — N138 Other obstructive and reflux uropathy: Secondary | ICD-10-CM

## 2014-07-27 DIAGNOSIS — I10 Essential (primary) hypertension: Secondary | ICD-10-CM

## 2014-07-27 DIAGNOSIS — L89154 Pressure ulcer of sacral region, stage 4: Secondary | ICD-10-CM | POA: Diagnosis not present

## 2014-07-27 DIAGNOSIS — N401 Enlarged prostate with lower urinary tract symptoms: Secondary | ICD-10-CM

## 2014-07-27 DIAGNOSIS — R627 Adult failure to thrive: Secondary | ICD-10-CM | POA: Diagnosis not present

## 2014-07-27 DIAGNOSIS — E860 Dehydration: Secondary | ICD-10-CM

## 2014-07-27 DIAGNOSIS — D638 Anemia in other chronic diseases classified elsewhere: Secondary | ICD-10-CM

## 2014-07-27 DIAGNOSIS — E038 Other specified hypothyroidism: Secondary | ICD-10-CM

## 2014-07-27 DIAGNOSIS — E118 Type 2 diabetes mellitus with unspecified complications: Secondary | ICD-10-CM

## 2014-07-27 DIAGNOSIS — N179 Acute kidney failure, unspecified: Secondary | ICD-10-CM

## 2014-07-27 DIAGNOSIS — R197 Diarrhea, unspecified: Secondary | ICD-10-CM | POA: Diagnosis not present

## 2014-07-27 DIAGNOSIS — G894 Chronic pain syndrome: Secondary | ICD-10-CM | POA: Diagnosis not present

## 2014-07-27 DIAGNOSIS — K810 Acute cholecystitis: Secondary | ICD-10-CM

## 2014-07-27 DIAGNOSIS — N183 Chronic kidney disease, stage 3 unspecified: Secondary | ICD-10-CM

## 2014-07-27 LAB — TSH: TSH: 1.53 u[IU]/mL (ref <=5.90)

## 2014-07-27 NOTE — Progress Notes (Signed)
MRN: [id number] Name: Gabriel Lynch  Sex: male Age: 69 y.o. DOB: [birth year]  [place] #: Helene Kelp Facility/Room: 107 Level Of Care: SNF Provider: Inocencio Homes D Emergency Contacts: Extended Emergency Contact Information Primary Emergency Contact: Cleotis Lema of [place] Phone: [phone] Relation: Son Secondary Emergency Contact: Rudene Anda [place] of [place] Phone: [phone] Relation: Sister  Code Status: FULL  Allergies: Influenza vaccines and Other  Chief Complaint  Patient presents with  . New Admit To SNF    HPI: Patient is 69 y.o. male with  a complex medical history whohas hadsepsis and septic Shock with Acute Renal Failure requiring Short Term Dialysis due to complications of Cholecystitis sending him to [facility] ICU, then to [facility], then to [facility] for for continued sub-acute illness and decubitus stage 4 wound Care, from 2/26/ 2016 to 07/13/2014 when he was discharged to SNF where he lasted only 5 days before returning to hospital on 7/2 due to problems withongoing urinary retention, worsening of his wounds and increased output from his colostomy.    On 7/2 pt was admitted back to hospital  for diarrhea, (repeat C. difficile PCR negative), dehydration, and acute on chronic kidney disease. General surgery consulted for recommendations regarding percutaneous cholecystostomy, Nephrology for  acute on chronic renal failure, and GI  regarding ongoing increased ostomy output. Per hosp rec pt had difficulty with hydration and acidosis, requiring bicarb drip, hypoglycemia from tight glucose control and ongong difficulties with stage 4 wounds and well as concerns for percutaneous cholecystotomy tube as to whether d/c, replace or take pt to surgery.   On 7/8 pt was discharged from hospital to attempt SNF placement once more in more stable, but I would not considered completely stable, condition. Type DM tx is in transition  as lantus dose was d/c just prior to hospital discharge, so will need to be very actively titrated on arrival to SNF. Acute on chronic kidney disease will be followed with q week BMP at least as Cr was near baseline with large amounts of IVF at end of hospitalization  which pt will not be getting at SNF. Pt's anemia is unstable as  Hb had been dropping during hospitalization, down to 8.4 on hospital d/c ;plan CBC QOD. Hypothyroid is stable in synthroid, pt's diarrhea is stable on questran and HTN stable on current meds.  Past Medical History  Diagnosis Date  . Ulcerative colitis   . Diabetes mellitus without complication   . Hypertension   . Iron deficiency anemia   . Hypothyroidism   . 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 Back surgery      multiple surgeries per family  . 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 Appendectomy    . Debridement of sacral decubitus ulcer  01-26-14      Medication List       This list is accurate as of: 07/27/14 11:59 PM.  Always use your most recent med list.               amLODipine 10 MG tablet  Commonly known as:  NORVASC  Take 10 mg by mouth daily.     aspirin 81 MG tablet  Take 81 mg by mouth daily.     calcium acetate 667 MG capsule  Commonly known as:  PHOSLO  Take 667 mg by mouth daily.  carvedilol 12.5 MG tablet  Commonly known as:  COREG  Take 12.5 mg by mouth 2 (two) times daily with a meal.     cholestyramine 4 G packet  Commonly known as:  QUESTRAN  Take 1 packet (4 g total) by mouth daily.     feeding supplement (PRO-STAT SUGAR FREE 64) Liqd  Take 30 mLs by mouth 2 (two) times daily.     fentaNYL 50 MCG/HR  Commonly known as:  DURAGESIC - dosed mcg/hr  Place 1 patch (50 mcg total) onto the skin every 3 (three) days.     ferrous sulfate 325 (65 FE) MG tablet  Take 325 mg by mouth daily with breakfast.     finasteride 5 MG tablet   Commonly known as:  PROSCAR  Take 1 tablet (5 mg total) by mouth daily.     insulin glargine 100 UNIT/ML injection  Commonly known as:  LANTUS  Inject 0.08 mLs (8 Units total) into the skin at bedtime.     insulin lispro 100 UNIT/ML injection  Commonly known as:  HUMALOG  Inject 0-15 Units into the skin 3 (three) times daily before meals. Sliding scale     levothyroxine 100 MCG tablet  Commonly known as:  SYNTHROID, LEVOTHROID  Take 100 mcg by mouth daily before breakfast.     mirtazapine 15 MG tablet  Commonly known as:  REMERON  Take 15 mg by mouth at bedtime.     multivitamin with minerals tablet  Take 1 tablet by mouth daily.     omeprazole 20 MG capsule  Commonly known as:  PRILOSEC  Take 20 mg by mouth daily.     ondansetron 4 MG tablet  Commonly known as:  ZOFRAN  Take 4 mg by mouth every 4 (four) hours as needed for nausea or vomiting.     OVER THE COUNTER MEDICATION  Take 120 mLs by mouth 2 (two) times daily. Med pass     oxyCODONE 5 MG immediate release tablet  Commonly known as:  Oxy IR/ROXICODONE  Take 1 tablet (5 mg total) by mouth every 6 (six) hours as needed for moderate pain.     PROBIOTIC DAILY Caps  Take 1 capsule by mouth daily.     tamsulosin 0.4 MG Caps capsule  Commonly known as:  FLOMAX  Take 2 capsules (0.8 mg total) by mouth every other day.        No orders of the defined types were placed in this encounter.     There is no immunization history on file for this patient.  History  Substance Use Topics  . Smoking status: Former Smoker -- 0.50 packs/day    Types: Cigarettes    Quit date: 07/24/1966  . Smokeless tobacco: Not on file  . Alcohol Use: No    Family history is noncontributory    Review of Systems  DATA OBTAINED: from patient, nurse GENERAL:  no fevers, +fatigue,+ appetite changes SKIN: No itching, rash or wounds EYES: No eye pain, redness, discharge EARS: No earache, tinnitus, change in hearing NOSE: No  congestion, drainage or bleeding  MOUTH/THROAT: No mouth or tooth pain, No sore throat RESPIRATORY: No cough, wheezing, SOB CARDIAC: No chest pain, palpitations, lower extremity edema  GI: No abdominal pain, No N/V/D or constipation, No heartburn or reflux  GU: No dysuria, frequency or urgency, or incontinence  MUSCULOSKELETAL: No unrelieved bone/joint pain NEUROLOGIC: No headache, dizziness or focal weakness PSYCHIATRIC: No c/o anxiety or sadness   Filed Vitals:  07/27/14 1519  BP: 141/78  Pulse: 85  Temp: 97.9 F (36.6 C)  Resp: 22    Physical Exam  GENERAL APPEARANCE: Alert, conversant,  No acute distress, chronically ill appearing SKIN: No diaphoresis rash HEAD: Normocephalic, atraumatic; swelling L jaw EYES: Conjunctiva/lids clear. Pupils round, reactive. EOMs intact.  EARS: External exam WNL, canals clear. Hearing grossly normal.  NOSE: No deformity or discharge.  MOUTH/THROAT: Lips w/o lesions  RESPIRATORY: Breathing is even, unlabored. Lung sounds are clear   CARDIOVASCULAR: Heart RRR no murmurs, rubs or gallops. No peripheral edema.   GASTROINTESTINAL: Abdomen is soft, non-tender, not distended w/ normal bowel sounds. GENITOURINARY: Bladder non tender, not distended  MUSCULOSKELETAL: No abnormal joints; muscle wasting NEUROLOGIC:  Cranial nerves 2-12 grossly intact.  PSYCHIATRIC: Mood and affect appropriate to situation, no behavioral issues  Patient Active Problem List   Diagnosis Date Noted  . Dehydration 08/01/2014  . Acute renal failure superimposed on stage 3 chronic kidney disease 08/01/2014  . Essential hypertension 08/01/2014  . FTT (failure to thrive) in adult 08/01/2014  . Diarrhea   . C. difficile diarrhea 07/19/2014  . Urinary retention 07/19/2014  . Leukocytosis   . S/P colectomy 07/13/2014  . Severe sepsis with septic shock 07/13/2014  . Osteomyelitis of sacrum 07/13/2014  . Wound of left lower extremity 07/13/2014  . CKD (chronic kidney  disease) stage 3, GFR 30-59 ml/min 07/13/2014  . DM type 2, controlled, with complication 79/02/4095  . Anemia, chronic disease 07/13/2014  . BPH (benign prostatic hypertrophy) with urinary obstruction 07/13/2014  . Hypothyroidism 07/13/2014  . H/O Clostridium difficile infection 07/13/2014  . Chronic pain syndrome 07/13/2014  . Debility 07/13/2014  . Colostomy in place 07/13/2014  . Central line complication   . Septic shock 03/06/2014  . Stage IV pressure ulcer of sacral region 03/06/2014  . Acute cholecystitis   . Acidosis   . AKI (acute kidney injury)     CBC    Component Value Date/Time   WBC 14.5* 07/24/2014 0420   RBC 2.51* 07/24/2014 0420   HGB 7.7* 07/24/2014 0420   HCT 24.2* 07/24/2014 0420   PLT 513* 07/24/2014 0420   MCV 96.4 07/24/2014 0420   LYMPHSABS 3.3 07/18/2014 2218   MONOABS 1.5* 07/18/2014 2218   EOSABS 1.0* 07/18/2014 2218   BASOSABS 0.1 07/18/2014 2218    CMP     Component Value Date/Time   NA 135 07/24/2014 0420   K 4.0 07/24/2014 0420   CL 107 07/24/2014 0420   CO2 21* 07/24/2014 0420   GLUCOSE 126* 07/24/2014 0420   BUN 38* 07/24/2014 0420   CREATININE 2.02* 07/24/2014 0420   CALCIUM 9.2 07/24/2014 0420   CALCIUM 8.5* 03/18/2014 0700   PROT 7.3 07/20/2014 0331   ALBUMIN 2.1* 07/22/2014 0434   AST 21 07/20/2014 0331   ALT 28 07/20/2014 0331   ALKPHOS 167* 07/20/2014 0331   BILITOT 0.3 07/20/2014 0331   GFRNONAA 32* 07/24/2014 0420   GFRAA 37* 07/24/2014 0420    Ir Cholangiogram Existing Tube  07/28/2014   CLINICAL DATA:  69 year old male with a history of a calculus cholecystitis. Percutaneous cholecystostomy tube initially placed in February 2016. Patient is now 1 week into a capped trial and clinically doing well. He presents for drain injection and possible tube removal.  EXAM: CHOLANGIOGRAM VIA EXISTING CATHETER  Date: 07/28/2014  PROCEDURE: 1. Cholecystostomy tube injection Interventional Radiologist:  Criselda Peaches, MD   ANESTHESIA/SEDATION: None required  MEDICATIONS: None required  FLUOROSCOPY TIME:  2 minutes  33.4 mGy  CONTRAST:  21mL OMNIPAQUE IOHEXOL 300 MG/ML  SOLN  TECHNIQUE: Informed consent was obtained from the patient following explanation of the procedure, risks, benefits and alternatives. The patient understands, agrees and consents for the procedure. All questions were addressed. A time out was performed.  Injection of the existing cholecystostomy tube demonstrates a patent 2. There is a large gallstone in the gallbladder neck which is nonobstructing. There is a smaller stone within the cystic duct which is also nonobstructing. No evidence of stone in the common bile duct or common duct dilatation. Contrast material passes through the ampulla and into the duodenum.  COMPLICATIONS: None  IMPRESSION: 1. Small small nonobstructing stone in the cystic duct. 2. Large mobile stone within the gallbladder. 3. No evidence of choledocholithiasis.  PLAN: Although patient's has done well after 1 week with his percutaneous cholecystostomy tube capped, I am concerned by the presence of the stone within the cystic duct. Following removal of the tube, the patient well have a significant (approximately 30% over the next 6-12 months) of recurrent calculus cholecystitis.  He has an appointment to see Dr. Georganna Skeans of [facility] tomorrow to be evaluated for possible elective laparoscopic cholecystectomy. If the patient remains a nonsurgical candidate for the foreseeable future, our options are to remove the tube knowing that there is some chance he will will develop recurrent cholecystitis in the future, or keep the tube in place capped with routine changes every 3 months. After discussion with the patient and his family, I believe they will opt to keep the tube in place and undergo tube change every 3 months if the patient cannot have a cholecystectomy.  Signed,  Criselda Peaches, MD  [facility]  [facility]   Electronically Signed   By: Jacqulynn Cadet M.D.   On: 07/28/2014 19:09   Not all labs, radiology exams or other studies done during hospitalization come through on my EPIC note; however they are reviewed by me.   Assessment and Plan  Diarrhea Recently completed course of vancomycin for C. difficile colitis on 07/13/14-exact duration of treatment not known. - C. difficile PCR negative this admission - Continue probiotics - GI consulted and have started Questran, resolved. Consider d/c questran if constipation. -of note kayexalate daily am listed as home meds, could be one of the cause of diarrhea. This is discontinued.   Dehydration - Likely due to GI losses - Mild hypercalcemia possibly related to same - s/p bicarbonate drip then ns by nephrology. -off ivf, Encourage oral intake  Acute renal failure superimposed on stage 3 chronic kidney disease Related to GI losses, hemodynamically mediated, possible evolution to ATN - Creatinine on 04/28/14 was 1.28. Admitted with creatinine of 2.85 - Creatinine increased to 2.9 despite IV fluid hydration. Likely still quite volume depleted. - Nephrology consulted and have increased IV fluids and briefly treated with bicarbonate drip for NAG metabolic acidosis - Renal ultrasound shows chronic medical renal disease without hydronephrosis. - Creatinine improving. Fluids changed to normal saline at increased rate then d/c just prior to d/c to SNF; will be depending on oral intake;BMP at least weekly initially will be needed to follow BUN/Cr as well as electrolytes and CO2    BPH (benign prostatic hypertrophy) with urinary obstruction Coud Foley placed in hospital - increased tamsulosin to 0.8mg  po qd, started proscar, minimize opiods - voided 600cc prior to discharge to SNF after foley removal, outpatient follow up with urology in  one week.   DM type 2, controlled, with complication Too tight CBG  control. Hypoglycemic range CBG in the morning in hospital - Reduced Lantus just prior to d/c to SNF;, SNF to continue monitor insulin dose. Almost sure will  need further reduction.   Stage IV pressure ulcer of sacral region Was evaled by wound care while in latest hospitalization and not felt to be deteriorating; will be followed by wound care nurse in [facility] monitored closely  Anemia, chronic disease Reported stable prior, but was not;continued to drop while hospitalized 7/2-7/8; have orderd CBC QOD to follow and transfuse when Hb < 7  Chronic pain syndrome Pain management, explained to patient try to minimize opioids pain meds to avoid urinary retention; goal at SNF will be not to advance pain regimen  Hypothyroidism Continue synthroid 100 mcg daily  Acute cholecystitis Stable.? Further plans - As per discharge summary 03/10/14, percutaneous cholecystostomy tube was supposed to be in place for 6 weeks followed by surgical referral for cholecystectomy. - Surgical consultation appreciated. Cholangiogram via tube on 7/5 in IR. We will then determine if needs replacement tube versus pull tube versus cholecystectomy. - As per surgery: Cholangiogram was patent. Tube clamped and IR will repeat cholangiogram and possible take out the tube. If he doesn't do well with it clamped, may need cholecystectomy versus continuing drain. follow-up with [facility] and [facility] [place] [facility] Outpt follow up planned from SNF if pt remains stable   Essential hypertension Continue amlodipine, carvedilol and Doxazosin  FTT (failure to thrive) in adult Inevitable when a human is acutely and chronically ill and near death multiple times over a 4+ month period which is  an extreme  state of affairs for the physical body and  human emotion; Plan - diet, interaction; physical exercise    Hennie Duos, MD

## 2014-07-28 ENCOUNTER — Ambulatory Visit (HOSPITAL_COMMUNITY)
Admit: 2014-07-28 | Discharge: 2014-07-28 | Disposition: A | Discharge status: Home / Self Care | Payer: Medicare Other | Source: Ambulatory Visit | Attending: Radiology | Admitting: Radiology

## 2014-07-28 ENCOUNTER — Other Ambulatory Visit: Payer: Self-pay | Admitting: Radiology

## 2014-07-28 DIAGNOSIS — K8010 Calculus of gallbladder with chronic cholecystitis without obstruction: Secondary | ICD-10-CM | POA: Insufficient documentation

## 2014-07-28 DIAGNOSIS — Z4682 Encounter for fitting and adjustment of non-vascular catheter: Secondary | ICD-10-CM | POA: Diagnosis not present

## 2014-07-28 DIAGNOSIS — K810 Acute cholecystitis: Secondary | ICD-10-CM

## 2014-07-28 LAB — TSH: TSH: 1.53 u[IU]/mL (ref 0.41–5.90)

## 2014-07-28 MED ORDER — IOHEXOL 300 MG/ML  SOLN
50.0000 mL | Freq: Once | INTRAMUSCULAR | Status: AC | PRN
  Administered 2014-07-28: 20 mL

## 2014-07-28 MED ORDER — LIDOCAINE HCL 1 % IJ SOLN
INTRAMUSCULAR | Status: AC
  Filled 2014-07-28: qty 20

## 2014-08-01 ENCOUNTER — Encounter: Payer: Self-pay | Admitting: Internal Medicine

## 2014-08-01 DIAGNOSIS — N183 Chronic kidney disease, stage 3 unspecified: Secondary | ICD-10-CM | POA: Insufficient documentation

## 2014-08-01 DIAGNOSIS — I10 Essential (primary) hypertension: Secondary | ICD-10-CM | POA: Insufficient documentation

## 2014-08-01 DIAGNOSIS — N179 Acute kidney failure, unspecified: Secondary | ICD-10-CM | POA: Insufficient documentation

## 2014-08-01 DIAGNOSIS — R627 Adult failure to thrive: Secondary | ICD-10-CM | POA: Insufficient documentation

## 2014-08-01 DIAGNOSIS — E860 Dehydration: Secondary | ICD-10-CM | POA: Insufficient documentation

## 2014-08-01 NOTE — Assessment & Plan Note (Signed)
Inevitable when a human is acutely and chronically ill and near death multiple times over a 4+ month period which is  an extreme  state of affairs for the physical body and  human emotion; Plan - diet, interaction; physical exercise

## 2014-08-01 NOTE — Assessment & Plan Note (Addendum)
Reported stable prior, but was not;continued to drop while hospitalized 7/2-7/8; have orderd CBC QOD to follow and transfuse when Hb < 7

## 2014-08-01 NOTE — Assessment & Plan Note (Signed)
Recently completed course of vancomycin for C. difficile colitis on 07/13/14-exact duration of treatment not known. - C. difficile PCR negative this admission - Continue probiotics - GI consulted and have started Questran, resolved. Consider d/c questran if constipation. -of note kayexalate daily am listed as home meds, could be one of the cause of diarrhea. This is discontinued.

## 2014-08-01 NOTE — Assessment & Plan Note (Signed)
Continue amlodipine, carvedilol and Doxazosin

## 2014-08-01 NOTE — Assessment & Plan Note (Signed)
Was evaled by wound care while in latest hospitalization and not felt to be deteriorating; will be followed by wound care nurse in [facility] monitored closely

## 2014-08-01 NOTE — Assessment & Plan Note (Signed)
Stable.? Further plans - As per discharge summary 03/10/14, percutaneous cholecystostomy tube was supposed to be in place for 6 weeks followed by surgical referral for cholecystectomy. - Surgical consultation appreciated. Cholangiogram via tube on 7/5 in IR. We will then determine if needs replacement tube versus pull tube versus cholecystectomy. - As per surgery: Cholangiogram was patent. Tube clamped and IR will repeat cholangiogram and possible take out the tube. If he doesn't do well with it clamped, may need cholecystectomy versus continuing drain. follow-up with [facility] and [facility] [place] [facility] Outpt follow up planned from SNF if pt remains stable

## 2014-08-01 NOTE — Assessment & Plan Note (Signed)
-   Likely due to GI losses - Mild hypercalcemia possibly related to same - s/p bicarbonate drip then ns by nephrology. -off ivf, Encourage oral intake

## 2014-08-01 NOTE — Assessment & Plan Note (Signed)
Continue synthroid 100 mcg daily. 

## 2014-08-01 NOTE — Assessment & Plan Note (Addendum)
Related to GI losses, hemodynamically mediated, possible evolution to ATN - Creatinine on 04/28/14 was 1.28. Admitted with creatinine of 2.85 - Creatinine increased to 2.9 despite IV fluid hydration. Likely still quite volume depleted. - Nephrology consulted and have increased IV fluids and briefly treated with bicarbonate drip for NAG metabolic acidosis - Renal ultrasound shows chronic medical renal disease without hydronephrosis. - Creatinine improving. Fluids changed to normal saline at increased rate then d/c just prior to d/c to SNF; will be depending on oral intake;BMP at least weekly initially will be needed to follow BUN/Cr as well as electrolytes and CO2

## 2014-08-01 NOTE — Assessment & Plan Note (Signed)
Pain management, explained to patient try to minimize opioids pain meds to avoid urinary retention; goal at SNF will be not to advance pain regimen

## 2014-08-01 NOTE — Assessment & Plan Note (Signed)
Coud Foley placed in hospital - increased tamsulosin to 0.8mg  po qd, started proscar, minimize opiods - voided 600cc prior to discharge to SNF after foley removal, outpatient follow up with urology in one week.

## 2014-08-01 NOTE — Assessment & Plan Note (Signed)
Too tight CBG control. Hypoglycemic range CBG in the morning in hospital - Reduced Lantus just prior to d/c to SNF;, SNF to continue monitor insulin dose. Almost sure will  need further reduction.

## 2014-09-30 ENCOUNTER — Other Ambulatory Visit (HOSPITAL_COMMUNITY): Payer: Self-pay | Admitting: General Surgery

## 2014-09-30 DIAGNOSIS — K819 Cholecystitis, unspecified: Secondary | ICD-10-CM

## 2014-10-02 ENCOUNTER — Encounter: Payer: Self-pay | Admitting: Nurse Practitioner

## 2014-10-02 ENCOUNTER — Non-Acute Institutional Stay (SKILLED_NURSING_FACILITY): Payer: Medicare Other | Admitting: Nurse Practitioner

## 2014-10-02 DIAGNOSIS — D638 Anemia in other chronic diseases classified elsewhere: Secondary | ICD-10-CM | POA: Diagnosis not present

## 2014-10-02 DIAGNOSIS — L89154 Pressure ulcer of sacral region, stage 4: Secondary | ICD-10-CM | POA: Diagnosis not present

## 2014-10-02 DIAGNOSIS — N401 Enlarged prostate with lower urinary tract symptoms: Secondary | ICD-10-CM | POA: Diagnosis not present

## 2014-10-02 DIAGNOSIS — E038 Other specified hypothyroidism: Secondary | ICD-10-CM

## 2014-10-02 DIAGNOSIS — E034 Atrophy of thyroid (acquired): Secondary | ICD-10-CM

## 2014-10-02 DIAGNOSIS — E118 Type 2 diabetes mellitus with unspecified complications: Secondary | ICD-10-CM

## 2014-10-02 DIAGNOSIS — N138 Other obstructive and reflux uropathy: Secondary | ICD-10-CM

## 2014-10-02 DIAGNOSIS — K810 Acute cholecystitis: Secondary | ICD-10-CM | POA: Diagnosis not present

## 2014-10-02 DIAGNOSIS — I10 Essential (primary) hypertension: Secondary | ICD-10-CM | POA: Diagnosis not present

## 2014-10-02 DIAGNOSIS — G894 Chronic pain syndrome: Secondary | ICD-10-CM

## 2014-10-02 NOTE — Progress Notes (Signed)
Nursing Home Location:  [facility]   Place of Service: SNF (31)  PCP: [name], MD  Allergies  Allergen Reactions  . Influenza Vaccines Anaphylaxis    ON MAR  . Other Other (See Comments)    Seasonal (sinus) allergies    Chief Complaint  Patient presents with  . Medical Management of Chronic Issues    HPI:  Patient is a 69 y.o. male seen today at [facility] for routine follow up on chronic conditions. Pt with a hx of DM, wounds, htn, colostomy, hypothyroid, CKF, urinary retention.  Pt s/p percutaneous cholecystomy with capped drain. No abdominal pain noted. colostomy with adequate output (previously with increased diarrhea). Pt reports good appetite. No episodes of hypoglycemia, blood sugars ranging from 88-162 fasting. Pt remains with multiple wounds but overall they are stable and improving. Being followed by wound care team. Pt reports acid reflux is controlled at this time. No acute complaints per pt and nursing without concerns.   Review of Systems:  Review of Systems  Constitutional: Negative for activity change, appetite change, fatigue and unexpected weight change.  HENT: Negative for congestion and hearing loss.   Eyes: Negative.   Respiratory: Negative for cough and shortness of breath.   Cardiovascular: Negative for chest pain, palpitations and leg swelling.  Gastrointestinal: Negative for abdominal pain, diarrhea and constipation.  Genitourinary: Negative for dysuria and difficulty urinating.  Musculoskeletal: Negative for myalgias and arthralgias.  Skin: Positive for wound. Negative for color change.       Sacral ulcer and right LE ulcer-- improving  Neurological: Negative for dizziness and weakness.  Psychiatric/Behavioral: Negative for behavioral problems, confusion and agitation.    Past Medical History  Diagnosis Date  . Ulcerative colitis   . Diabetes mellitus without complication   . Hypertension   . Iron deficiency  anemia   . Hypothyroidism   . 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 Back surgery      multiple surgeries per family  . 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 Appendectomy    . Debridement of sacral decubitus ulcer  01-26-14   Social History:   reports that he quit smoking about 48 years ago. His smoking use included Cigarettes. He smoked 0.50 packs per day. He does not have any smokeless tobacco history on file. He reports that he does not drink alcohol or use illicit drugs.  No family history on file.  Medications: Patient's Medications  New Prescriptions   No medications on file  Previous Medications   AMINO ACIDS-PROTEIN HYDROLYS (FEEDING SUPPLEMENT, PRO-STAT SUGAR FREE 64,) LIQD    Take 30 mLs by mouth 2 (two) times daily.   AMLODIPINE (NORVASC) 10 MG TABLET    Take 10 mg by mouth daily.   ASPIRIN 81 MG TABLET    Take 81 mg by mouth daily.   CALCIUM ACETATE (PHOSLO) 667 MG CAPSULE    Take 667 mg by mouth daily.   CARVEDILOL (COREG) 12.5 MG TABLET    Take 12.5 mg by mouth 2 (two) times daily with a meal.   CHOLESTYRAMINE (QUESTRAN) 4 G PACKET    Take 1 packet (4 g total) by mouth daily.   FENTANYL (DURAGESIC - DOSED MCG/HR) 50 MCG/HR    Place 1 patch (50 mcg total) onto the skin every 3 (three) days.   FERROUS SULFATE 325 (  65 FE) MG TABLET    Take 325 mg by mouth daily with breakfast.   FINASTERIDE (PROSCAR) 5 MG TABLET    Take 1 tablet (5 mg total) by mouth daily.   INSULIN GLARGINE (LANTUS) 100 UNIT/ML INJECTION    Inject 0.08 mLs (8 Units total) into the skin at bedtime.   INSULIN LISPRO (HUMALOG) 100 UNIT/ML INJECTION    Inject 0-15 Units into the skin 3 (three) times daily before meals. Sliding scale   LEVOTHYROXINE (SYNTHROID, LEVOTHROID) 100 MCG TABLET    Take 100 mcg by mouth daily before breakfast.   MIRTAZAPINE (REMERON) 15 MG TABLET    Take 15 mg by mouth at bedtime.   MULTIPLE  VITAMINS-MINERALS (MULTIVITAMIN WITH MINERALS) TABLET    Take 1 tablet by mouth daily.   OMEPRAZOLE (PRILOSEC) 20 MG CAPSULE    Take 20 mg by mouth daily.   ONDANSETRON (ZOFRAN) 4 MG TABLET    Take 4 mg by mouth every 4 (four) hours as needed for nausea or vomiting.   OVER THE COUNTER MEDICATION    Take 120 mLs by mouth 2 (two) times daily. Med pass   OXYCODONE (OXY IR/ROXICODONE) 5 MG IMMEDIATE RELEASE TABLET    Take 1 tablet (5 mg total) by mouth every 6 (six) hours as needed for moderate pain.   PROBIOTIC PRODUCT (PROBIOTIC DAILY) CAPS    Take 1 capsule by mouth daily.   TAMSULOSIN (FLOMAX) 0.4 MG CAPS CAPSULE    Take 2 capsules (0.8 mg total) by mouth every other day.  Modified Medications   No medications on file  Discontinued Medications   No medications on file     Physical Exam: Filed Vitals:   10/02/14 1614  BP: 119/98  Pulse: 54  Temp: 97.4 F (36.3 C)  Resp: 18  Weight: 159 lb (72.122 kg)    Physical Exam  Constitutional: He is oriented to person, place, and time. He appears well-developed and well-nourished. No distress.  HENT:  Head: Normocephalic and atraumatic.  Mouth/Throat: Oropharynx is clear and moist. No oropharyngeal exudate.  Eyes: Conjunctivae and EOM are normal. Pupils are equal, round, and reactive to light.  Neck: Normal range of motion. Neck supple.  Cardiovascular: Normal rate, regular rhythm and normal heart sounds.   Pulmonary/Chest: Effort normal and breath sounds normal.  Abdominal: Soft. Bowel sounds are normal.  Right chole drain Colostomy   Musculoskeletal: He exhibits no edema or tenderness.  Neurological: He is alert and oriented to person, place, and time.  Skin: Skin is warm and dry. He is not diaphoretic.  Dressing intact to right LE, no drainage or erythema noted  Psychiatric: He has a normal mood and affect.    Labs reviewed: Basic Metabolic Panel:  Recent Labs  04/28/14 0700  07/21/14 0430 07/22/14 0434 07/23/14 0532  07/24/14 0420  NA 134*  < > 133* 138 136 135  K 5.1  < > 3.2* 3.7 3.7 4.0  CL 105  < > 104 109 107 107  CO2 20  < > 21* 23 22 21*  GLUCOSE 160*  < > 95 70 70 126*  BUN 43*  < > 70* 53* 39* 38*  CREATININE 1.28  < > 2.63* 2.12* 1.89* 2.02*  CALCIUM 9.6  < > 10.0 9.4 9.3 9.2  MG 1.8  --   --  1.6* 2.0 1.8  PHOS 4.8*  --  1.5* 2.7  --   --   < > = values in this interval not displayed.  Liver Function Tests:  Recent Labs  03/06/14 0554  03/11/14 0700  07/20/14 0331 07/21/14 0430 07/22/14 0434  AST 27  --  19  --  21  --   --   ALT 20  --  16  --  28  --   --   ALKPHOS 296*  --  156*  --  167*  --   --   BILITOT 0.3  --  0.7  --  0.3  --   --   PROT 7.6  --  5.2*  --  7.3  --   --   ALBUMIN 2.6*  < > 1.8*  1.8*  < > 2.5* 2.5* 2.1*  < > = values in this interval not displayed.  Recent Labs  03/07/14 1000 03/09/14 0900  LIPASE 193* 197*   No results for input(s): AMMONIA in the last 8760 hours. CBC:  Recent Labs  04/15/14 0519  04/24/14 0539  07/18/14 2218  07/21/14 0430 07/23/14 0532 07/24/14 0420  WBC 11.9*  < > 11.5*  < > 14.6*  < > 15.3* 13.5* 14.5*  NEUTROABS 7.8*  --  6.8  --  8.7*  --   --   --   --   HGB 12.4*  < > 12.9*  < > 9.9*  < > 8.5* 8.0* 7.7*  HCT 39.0  < > 42.8  < > 31.9*  < > 26.8* 26.2* 24.2*  MCV 90.1  < > 91.6  < > 95.5  < > 95.4 96.0 96.4  PLT 504*  < > 611*  < > 723*  < > 577* 535* 513*  < > = values in this interval not displayed. TSH:  Recent Labs  03/06/14 0721 07/22/14 0434 07/27/14  TSH 1.174 1.455 1.53   A1C: Lab Results  Component Value Date   HGBA1C 6.3* 03/16/2014   Lipid Panel: No results for input(s): CHOL, HDL, LDLCALC, TRIG, CHOLHDL, LDLDIRECT in the last 8760 hours.    Assessment/Plan 1. Anemia, chronic disease conts on iron daily, will follow up cbc  2. Essential hypertension Blood pressure has been stable, conts on amlodipine, carvedilol and Doxazosin  3. DM type 2, controlled, with complication Blood  sugars have been stable. No hypoglcemic episodes, will follow up A1c at this time.   4. BPH (benign prostatic hypertrophy) with urinary obstruction Stable at this time, conts on tamsulosin and proscar  5. Stage IV pressure ulcer of sacral region Stable, cont wound care per treatment team.   6. Chronic pain syndrome Pain is well managed on current regimen. Pt denies any pain at time of visit.  7. Hypothyroidism due to acquired atrophy of thyroid TSH stable in July, conts on synthroid 100 mcg daily   8. Acute cholecystis  Following with surgery currently chole drain is clamped and doing well. Without increased abdominal pain   Jessica K. Harle Battiest  [facility] [phone] 8 am - 5 pm) [phone] (after hours)

## 2014-10-19 ENCOUNTER — Non-Acute Institutional Stay (SKILLED_NURSING_FACILITY): Payer: Medicare Other | Admitting: Internal Medicine

## 2014-10-19 DIAGNOSIS — L03116 Cellulitis of left lower limb: Secondary | ICD-10-CM

## 2014-10-19 NOTE — Progress Notes (Signed)
MRN: [id number] Name: Gabriel Lynch  Sex: male Age: 69 y.o. DOB: [birth year]  [place] #: Helene Kelp Facility/Room: Level Of Care: SNF Provider: Inocencio Homes D Emergency Contacts: Extended Emergency Contact Information Primary Emergency Contact: Cleotis Lema of [place] Phone: [phone] Relation: Son Secondary Emergency Contact: Rudene Anda [place] of [place] Phone: [phone] Relation: Sister  Code Status:   Allergies: Influenza vaccines and Other  Chief Complaint  Patient presents with  . Acute Visit    HPI: Patient is 69 y.o. male who wound care nurse asked me to see for redness L leg worse for 1+ days. Has had infection on and off. Tx recently doxy and keflex. Mild MS change, no fever, nausea, vomiting  Past Medical History  Diagnosis Date  . Ulcerative colitis ([place])   . Diabetes mellitus without complication ([place])   . Hypertension   . Iron deficiency anemia   . Hypothyroidism   . 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 Back surgery      multiple surgeries per family  . 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 Appendectomy    . Debridement of sacral decubitus ulcer  01-26-14      Medication List       This list is accurate as of: 10/19/14 11:59 PM.  Always use your most recent med list.               amLODipine 10 MG tablet  Commonly known as:  NORVASC  Take 10 mg by mouth daily.     aspirin 81 MG tablet  Take 81 mg by mouth daily.     calcium acetate 667 MG capsule  Commonly known as:  PHOSLO  Take 667 mg by mouth daily.     carvedilol 12.5 MG tablet  Commonly known as:  COREG  Take 12.5 mg by mouth 2 (two) times daily with a meal.     cholestyramine 4 G packet  Commonly known as:  QUESTRAN  Take 1 packet (4 g total) by mouth daily.     feeding supplement (PRO-STAT SUGAR FREE 64) Liqd  Take 30 mLs by mouth 2 (two) times  daily.     fentaNYL 50 MCG/HR  Commonly known as:  DURAGESIC - dosed mcg/hr  Place 1 patch (50 mcg total) onto the skin every 3 (three) days.     ferrous sulfate 325 (65 FE) MG tablet  Take 325 mg by mouth daily with breakfast.     finasteride 5 MG tablet  Commonly known as:  PROSCAR  Take 1 tablet (5 mg total) by mouth daily.     insulin glargine 100 UNIT/ML injection  Commonly known as:  LANTUS  Inject 0.08 mLs (8 Units total) into the skin at bedtime.     insulin lispro 100 UNIT/ML injection  Commonly known as:  HUMALOG  Inject 0-15 Units into the skin 3 (three) times daily before meals. Sliding scale     levothyroxine 100 MCG tablet  Commonly known as:  SYNTHROID, LEVOTHROID  Take 100 mcg by mouth daily before breakfast.     mirtazapine 15 MG tablet  Commonly known as:  REMERON  Take 15 mg by mouth at bedtime.     multivitamin with minerals tablet  Take 1 tablet by mouth daily.     omeprazole 20 MG capsule  Commonly known as:  PRILOSEC  Take  20 mg by mouth daily.     ondansetron 4 MG tablet  Commonly known as:  ZOFRAN  Take 4 mg by mouth every 4 (four) hours as needed for nausea or vomiting.     OVER THE COUNTER MEDICATION  Take 120 mLs by mouth 2 (two) times daily. Med pass     oxyCODONE 5 MG immediate release tablet  Commonly known as:  Oxy IR/ROXICODONE  Take 1 tablet (5 mg total) by mouth every 6 (six) hours as needed for moderate pain.     PROBIOTIC DAILY Caps  Take 1 capsule by mouth daily.     tamsulosin 0.4 MG Caps capsule  Commonly known as:  FLOMAX  Take 2 capsules (0.8 mg total) by mouth every other day.        No orders of the defined types were placed in this encounter.    Immunization History  Administered Date(s) Administered  . PPD Test 08/12/2014    Social History  Substance Use Topics  . Smoking status: Former Smoker -- 0.50 packs/day    Types: Cigarettes    Quit date: 07/24/1966  . Smokeless tobacco: Not on file  . Alcohol  Use: No    Review of Systems  DATA OBTAINED: from nurse GENERAL:  no fevers, fatigue, appetite changes SKIN: No itching, rash HEENT: No complaint RESPIRATORY: No cough, wheezing, SOB CARDIAC: No chest pain, palpitations, lower extremity edema  GI: No abdominal pain, No N/V/D or constipation, No heartburn or reflux  GU: No dysuria, frequency or urgency, or incontinence  MUSCULOSKELETAL: No unrelieved bone/joint pain NEUROLOGIC: No headache, dizziness, slt MS change  PSYCHIATRIC: No overt anxiety or sadness  Filed Vitals:   10/22/14 2159  BP: 123/78  Pulse: 52  Temp: 97.5 F (36.4 C)  Resp: 18    Physical Exam  GENERAL APPEARANCE: Alert, No acute distress  SKIN:multiple shallow wounds L calf with erythema and heat, no d/c HEENT: Unremarkable RESPIRATORY: Breathing is even, unlabored. Lung sounds are clear   CARDIOVASCULAR: Heart RRR no murmurs, rubs or gallops. No peripheral edema  GASTROINTESTINAL: Abdomen is soft, non-tender, not distended w/ normal bowel sounds.  GENITOURINARY: Bladder non tender, not distended  MUSCULOSKELETAL: No abnormal joints, thin  musculature NEUROLOGIC: Cranial nerves 2-12 grossly intact. Moves all extremities PSYCHIATRIC: Mood and affect appropriate to situation with dementia, no behavioral issues  Patient Active Problem List   Diagnosis Date Noted  . Cellulitis of leg, left 10/21/2014  . Dehydration 08/01/2014  . Acute renal failure superimposed on stage 3 chronic kidney disease ([place]) 08/01/2014  . Essential hypertension 08/01/2014  . FTT (failure to thrive) in adult 08/01/2014  . Diarrhea   . C. difficile diarrhea 07/19/2014  . Urinary retention 07/19/2014  . Leukocytosis   . S/P colectomy 07/13/2014  . Severe sepsis with septic shock ([place]) 07/13/2014  . 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 Wound of left lower extremity 07/13/2014  . CKD (chronic kidney disease) stage 3, GFR 30-59 ml/min 07/13/2014  . DM type 2, controlled,  with complication ([place]) 29/79/8921  . Anemia, chronic disease 07/13/2014  . BPH (benign prostatic hypertrophy) with urinary obstruction 07/13/2014  . Hypothyroidism 07/13/2014  . H/O Clostridium difficile infection 07/13/2014  . Chronic pain syndrome 07/13/2014  . Debility 07/13/2014  . Colostomy in place [facility]) 07/13/2014  . Central line complication   . Septic shock ([place]) 03/06/2014  . Stage IV pressure ulcer of sacral region ([place]) 03/06/2014  . Acute cholecystitis   . Acidosis   .  AKI (acute kidney injury) ([place])     CBC    Component Value Date/Time   WBC 14.5* 07/24/2014 0420   RBC 2.51* 07/24/2014 0420   HGB 7.7* 07/24/2014 0420   HCT 24.2* 07/24/2014 0420   PLT 513* 07/24/2014 0420   MCV 96.4 07/24/2014 0420   LYMPHSABS 3.3 07/18/2014 2218   MONOABS 1.5* 07/18/2014 2218   EOSABS 1.0* 07/18/2014 2218   BASOSABS 0.1 07/18/2014 2218    CMP     Component Value Date/Time   NA 135 07/24/2014 0420   K 4.0 07/24/2014 0420   CL 107 07/24/2014 0420   CO2 21* 07/24/2014 0420   GLUCOSE 126* 07/24/2014 0420   BUN 38* 07/24/2014 0420   CREATININE 2.02* 07/24/2014 0420   CALCIUM 9.2 07/24/2014 0420   CALCIUM 8.5* 03/18/2014 0700   PROT 7.3 07/20/2014 0331   ALBUMIN 2.1* 07/22/2014 0434   AST 21 07/20/2014 0331   ALT 28 07/20/2014 0331   ALKPHOS 167* 07/20/2014 0331   BILITOT 0.3 07/20/2014 0331   GFRNONAA 32* 07/24/2014 0420   GFRAA 37* 07/24/2014 0420    Assessment and Plan  Cellulitis of leg, left Pt has been treated recently with doxy and on keflex now. Plan d/c keflex and start clindamycin 300 mg q 6 for 7days; will monitor   Time spent 25 min Hennie Duos, MD

## 2014-10-21 ENCOUNTER — Encounter: Payer: Self-pay | Admitting: Internal Medicine

## 2014-10-21 DIAGNOSIS — L03116 Cellulitis of left lower limb: Secondary | ICD-10-CM | POA: Insufficient documentation

## 2014-10-21 NOTE — Assessment & Plan Note (Signed)
Pt has been treated recently with doxy and on keflex now. Plan d/c keflex and start clindamycin 300 mg q 6 for 7days; will monitor

## 2014-10-22 ENCOUNTER — Encounter: Payer: Self-pay | Admitting: Internal Medicine

## 2014-11-06 ENCOUNTER — Non-Acute Institutional Stay (SKILLED_NURSING_FACILITY): Payer: Medicare Other | Admitting: Nurse Practitioner

## 2014-11-06 ENCOUNTER — Encounter: Payer: Self-pay | Admitting: Nurse Practitioner

## 2014-11-06 DIAGNOSIS — R627 Adult failure to thrive: Secondary | ICD-10-CM | POA: Diagnosis not present

## 2014-11-06 DIAGNOSIS — K810 Acute cholecystitis: Secondary | ICD-10-CM

## 2014-11-06 DIAGNOSIS — L89154 Pressure ulcer of sacral region, stage 4: Secondary | ICD-10-CM | POA: Diagnosis not present

## 2014-11-06 DIAGNOSIS — E118 Type 2 diabetes mellitus with unspecified complications: Secondary | ICD-10-CM

## 2014-11-06 DIAGNOSIS — D638 Anemia in other chronic diseases classified elsewhere: Secondary | ICD-10-CM

## 2014-11-06 DIAGNOSIS — N183 Chronic kidney disease, stage 3 unspecified: Secondary | ICD-10-CM

## 2014-11-06 DIAGNOSIS — N401 Enlarged prostate with lower urinary tract symptoms: Secondary | ICD-10-CM | POA: Diagnosis not present

## 2014-11-06 DIAGNOSIS — N138 Other obstructive and reflux uropathy: Secondary | ICD-10-CM

## 2014-11-06 DIAGNOSIS — S81802D Unspecified open wound, left lower leg, subsequent encounter: Secondary | ICD-10-CM

## 2014-11-06 NOTE — Progress Notes (Signed)
Patient ID: Gabriel Lynch, male   DOB: 12-Oct-1945, 69 y.o.   MRN: [id number]    Nursing Home Location:  [facility]   Code Status: Full Code   Place of Service: SNF (31)  PCP: [name], MD  Allergies  Allergen Reactions  . Influenza Vaccines Anaphylaxis    ON MAR  . Other Other (See Comments)    Seasonal (sinus) allergies    Chief Complaint  Patient presents with  . Medical Management of Chronic Issues    Routine Visit     HPI:  Patient is a 69 y.o. male seen today at [facility] for routine follow up on chronic conditions. Pt with a hx of DM, wounds, htn, colostomy, hypothyroid, CKF, urinary retention. Pt s/p percutaneous cholecystomy with capped drain. continues wihtout abdominal pain. Pt reports good appetite and blood sugars well controlled.  Treatment nurse at bedside during exam. Concern over lower ext wounds which had been bleeding earlier today but now are open with out blood loss. No eryethma or odor noted. Follow with wound care provider weekly and nursing daily.   Review of Systems:  Review of Systems  Constitutional: Negative for activity change, appetite change, fatigue and unexpected weight change.  HENT: Negative for congestion and hearing loss.   Eyes: Negative.   Respiratory: Negative for cough and shortness of breath.   Cardiovascular: Negative for chest pain, palpitations and leg swelling.  Gastrointestinal: Negative for abdominal pain, diarrhea and constipation.  Genitourinary: Negative for dysuria and difficulty urinating.  Musculoskeletal: Negative for myalgias and arthralgias.  Skin: Positive for wound. Negative for color change.       Sacral ulcer-- improving Right lowe leg wound unchanged, noted bleeding today. No foul odor or drainage  Neurological: Negative for dizziness and weakness.  Psychiatric/Behavioral: Negative for behavioral problems, confusion and agitation.    Past Medical History  Diagnosis Date  .  Ulcerative colitis ([place])   . Diabetes mellitus without complication ([place])   . Hypertension   . Iron deficiency anemia   . Hypothyroidism   . 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 Back surgery      multiple surgeries per family  . 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 Appendectomy    . Debridement of sacral decubitus ulcer  01-26-14   Social History:   reports that he quit smoking about 48 years ago. His smoking use included Cigarettes. He smoked 0.50 packs per day. He does not have any smokeless tobacco history on file. He reports that he does not drink alcohol or use illicit drugs.  History reviewed. No pertinent family history.  Medications: Patient's Medications  New Prescriptions   No medications on file  Previous Medications   AMINO ACIDS-PROTEIN HYDROLYS (FEEDING SUPPLEMENT, PRO-STAT SUGAR FREE 64,) LIQD    Take 30 mLs by mouth 2 (two) times daily.   AMLODIPINE (NORVASC) 10 MG TABLET    Take 10 mg by mouth daily.   ASPIRIN 81 MG TABLET    Take 81 mg by mouth daily.   CALCIUM ACETATE (PHOSLO) 667 MG CAPSULE    Take 667 mg by mouth daily.   CARVEDILOL (COREG) 12.5 MG TABLET    Take 12.5 mg by mouth 2 (two) times daily with a meal.   CHOLESTYRAMINE (QUESTRAN) 4 G PACKET    Take 1 packet (4 g total) by mouth daily.  FERROUS SULFATE 325 (65 FE) MG TABLET    Take 325 mg by mouth daily with breakfast.   FINASTERIDE (PROSCAR) 5 MG TABLET    Take 1 tablet (5 mg total) by mouth daily.   INSULIN GLARGINE (LANTUS) 100 UNIT/ML INJECTION    Inject 0.08 mLs (8 Units total) into the skin at bedtime.   INSULIN LISPRO (HUMALOG) 100 UNIT/ML INJECTION    Inject 0-15 Units into the skin 3 (three) times daily before meals. Sliding scale   LEVOTHYROXINE (SYNTHROID, LEVOTHROID) 100 MCG TABLET    Take 100 mcg by mouth daily before breakfast.   MIRTAZAPINE (REMERON) 15 MG TABLET    Take 15 mg by mouth at bedtime.    MULTIPLE VITAMINS-MINERALS (MULTIVITAMIN WITH MINERALS) TABLET    Take 1 tablet by mouth daily.   OMEPRAZOLE (PRILOSEC) 20 MG CAPSULE    Take 20 mg by mouth daily.   ONDANSETRON (ZOFRAN) 4 MG TABLET    Take 4 mg by mouth every 4 (four) hours as needed for nausea or vomiting.   OVER THE COUNTER MEDICATION    Take 120 mLs by mouth 2 (two) times daily. Med pass   OXYCODONE (OXY IR/ROXICODONE) 5 MG IMMEDIATE RELEASE TABLET    Take 1 tablet (5 mg total) by mouth every 6 (six) hours as needed for moderate pain.   PROBIOTIC PRODUCT (PROBIOTIC DAILY) CAPS    Take 1 capsule by mouth daily.   TAMSULOSIN (FLOMAX) 0.4 MG CAPS CAPSULE    Take 2 capsules (0.8 mg total) by mouth every other day.  Modified Medications   No medications on file  Discontinued Medications   FENTANYL (DURAGESIC - DOSED MCG/HR) 50 MCG/HR    Place 1 patch (50 mcg total) onto the skin every 3 (three) days.     Physical Exam: Filed Vitals:   11/06/14 1508  BP: 123/76  Pulse: 56  Temp: 97.2 F (36.2 C)  TempSrc: Oral  Resp: 19    Physical Exam  Constitutional: He is oriented to person, place, and time. No distress.  HENT:  Head: Normocephalic and atraumatic.  Mouth/Throat: Oropharynx is clear and moist. No oropharyngeal exudate.  Eyes: Conjunctivae and EOM are normal. Pupils are equal, round, and reactive to light.  Neck: Normal range of motion. Neck supple.  Cardiovascular: Normal rate, regular rhythm and normal heart sounds.   Pulmonary/Chest: Effort normal and breath sounds normal.  Abdominal: Soft. Bowel sounds are normal.  Right chole drain Colostomy   Musculoskeletal: He exhibits no edema or tenderness.  Neurological: He is alert and oriented to person, place, and time.  Skin: Skin is warm and dry. He is not diaphoretic.  right LE with multiple wounds to anterior aspect of lower leg, no drainage or erythema noted  Psychiatric: He has a normal mood and affect.    Labs reviewed: Basic Metabolic  Panel:  Recent Labs  04/28/14 0700  07/21/14 0430 07/22/14 0434 07/23/14 0532 07/24/14 0420  NA 134*  < > 133* 138 136 135  K 5.1  < > 3.2* 3.7 3.7 4.0  CL 105  < > 104 109 107 107  CO2 20  < > 21* 23 22 21*  GLUCOSE 160*  < > 95 70 70 126*  BUN 43*  < > 70* 53* 39* 38*  CREATININE 1.28  < > 2.63* 2.12* 1.89* 2.02*  CALCIUM 9.6  < > 10.0 9.4 9.3 9.2  MG 1.8  --   --  1.6* 2.0 1.8  PHOS 4.8*  --  1.5* 2.7  --   --   < > = values in this interval not displayed. Liver Function Tests:  Recent Labs  03/06/14 0554  03/11/14 0700  07/20/14 0331 07/21/14 0430 07/22/14 0434  AST 27  --  19  --  21  --   --   ALT 20  --  16  --  28  --   --   ALKPHOS 296*  --  156*  --  167*  --   --   BILITOT 0.3  --  0.7  --  0.3  --   --   PROT 7.6  --  5.2*  --  7.3  --   --   ALBUMIN 2.6*  < > 1.8*  1.8*  < > 2.5* 2.5* 2.1*  < > = values in this interval not displayed.  Recent Labs  03/07/14 1000 03/09/14 0900  LIPASE 193* 197*   No results for input(s): AMMONIA in the last 8760 hours. CBC:  Recent Labs  04/15/14 0519  04/24/14 0539  07/18/14 2218  07/21/14 0430 07/23/14 0532 07/24/14 0420  WBC 11.9*  < > 11.5*  < > 14.6*  < > 15.3* 13.5* 14.5*  NEUTROABS 7.8*  --  6.8  --  8.7*  --   --   --   --   HGB 12.4*  < > 12.9*  < > 9.9*  < > 8.5* 8.0* 7.7*  HCT 39.0  < > 42.8  < > 31.9*  < > 26.8* 26.2* 24.2*  MCV 90.1  < > 91.6  < > 95.5  < > 95.4 96.0 96.4  PLT 504*  < > 611*  < > 723*  < > 577* 535* 513*  < > = values in this interval not displayed. TSH:  Recent Labs  03/06/14 0721 07/22/14 0434 07/27/14  TSH 1.174 1.455 1.53   A1C: Lab Results  Component Value Date   HGBA1C 6.3* 03/16/2014   Lipid Panel: No results for input(s): CHOL, HDL, LDLCALC, TRIG, CHOLHDL, LDLDIRECT in the last 8760 hours. CBC (No Diff) Darnelle Going SID: [id number] Final - Approved 10/05/2014 11:24AM EDT Collected: 10/05/2014 4:19AM EDT TEST FLAG RESULT REF RANGE UNIT WBC 10.3 4.5-10.8  10*3/L RBC L 3.47 4.00-6.60 10*6/L Hematocrit L 34.9 45.0-54.0 % Hemoglobin L 11.5 14.0-18.0 g/dL MCV H 100.6 80.0-100.0 fL MCH 33.2 26.0-35.0 pg MCHC 32.9 31.0-36.5 g/dL RDW 14.5 11.0-16.0 % Platelet Count 357.0 150.0-450.0 10*3/L MPV 8.4 6.5-12.0 fL  Hemoglobin A1c 5.7  4.1-6.1 %  TEST FLAG RESULT REF RANGE UNIT Albumin L 3.2 3.5-5.7 g/dL Albumin/Globulin Ratio L 0.8 1.2-1.8 g/dL Alkaline Phosphatase H 107 34-104 U/L ALT (SGPT) 22 7-52 U/L AST (SGOT) 20 13-39 U/L Bicarbonate (CO2) 24 21-31 mmol/L Bilirubin, Total 0.3 0.3-1.0 mg/dL BUN (Urea Nitrogen) H 38 7-25 mg/dL BUN/Cr Ratio 17 11-19 mg/dL Calcium 9.8 8.6-10.3 mg/dL Chloride 107 98-107 mmol/L Creatinine, Serum H 2.3 0.6-1.3 mg/dL Glucose 98 65-99 mg/dL Fasting: 65-99 mg/d Non-fasting: 65-125 mg/dL Potassium 4.1 3.5-5.1 mmol/L Protein, Total 7.0 6.4-8.9 g/dL Sodium 136 136-145 mmol/L Osmolality Calculated 281 268-294 mOsm/kg eGFR African L 34 >59 mL/min/1.73 m2 Assessment/Plan 1. DM type 2, controlled, with complication ([place]) -blood sugars well controlled on lantus and humalog, A1c at goal  Will cont current regimen   2. Stage IV pressure ulcer of sacral region [facility]) Improving. conts with pressure reduction, wound care and nutritional support   3. Wound of left lower extremity, subsequent encounter Slow healing, followed by wound care,  with PAD, conts on ASA, no signs of acute infection/cellulitis at this time.  Will follow up cbc for wbc and hgb  4. CKD (chronic kidney disease) stage 3, GFR 30-59 ml/min Cr 2.3, encouraging hydration, will monitor, and follow up bmp at this time.   5. Anemia, chronic disease hgb improved to 11.5, conts on iron daily   6. BPH (benign prostatic hypertrophy) with urinary obstruction Without troubles voiding, conts on flomax 0.4 mg daily and proscar 5 mg daily   7. FTT (failure to thrive) in adult Positive weight gain, pt eating better, albumin is improving. conts on  Remeron daily   8. Cholecysitis Remains with right upper quad drain that has been capped, no ongoing pain, follow up with IR scheduled, nursing to confirm appt.    Carlos American. Harle Battiest  [facility] [phone] 8 am - 5 pm) [phone] (after hours)

## 2014-11-09 LAB — CBC AND DIFFERENTIAL
HCT: 31 % — AB (ref 41–53)
HCT: 31 % — AB (ref 41–53)
HEMOGLOBIN: 10.2 g/dL — AB (ref 13.5–17.5)
HEMOGLOBIN: 10.2 g/dL — AB (ref 13.5–17.5)
PLATELETS: 311 10*3/uL (ref 150–399)
Platelets: 311 10*3/uL (ref 150–399)
WBC: 11.7 10*3/mL
WBC: 11.7 10*3/mL

## 2014-11-09 LAB — BASIC METABOLIC PANEL
BUN: 43 mg/dL — AB (ref 4–21)
BUN: 43 mg/dL — AB (ref 4–21)
CREATININE: 2.9 mg/dL — AB (ref 0.6–1.3)
CREATININE: 2.9 mg/dL — AB (ref 0.6–1.3)
GLUCOSE: 147 mg/dL
Glucose: 147 mg/dL
POTASSIUM: 4.2 mmol/L (ref 3.4–5.3)
POTASSIUM: 4.2 mmol/L (ref 3.4–5.3)
SODIUM: 138 mmol/L (ref 137–147)
Sodium: 138 mmol/L (ref 137–147)

## 2014-11-09 LAB — HEPATIC FUNCTION PANEL
ALT: 15 U/L (ref 10–40)
AST: 13 U/L — AB (ref 14–40)
Alkaline Phosphatase: 90 U/L (ref 25–125)
Bilirubin, Total: 0.3 mg/dL

## 2014-12-09 ENCOUNTER — Non-Acute Institutional Stay (SKILLED_NURSING_FACILITY): Payer: Medicare Other | Admitting: Nurse Practitioner

## 2014-12-09 DIAGNOSIS — D638 Anemia in other chronic diseases classified elsewhere: Secondary | ICD-10-CM

## 2014-12-09 DIAGNOSIS — N401 Enlarged prostate with lower urinary tract symptoms: Secondary | ICD-10-CM

## 2014-12-09 DIAGNOSIS — I10 Essential (primary) hypertension: Secondary | ICD-10-CM | POA: Diagnosis not present

## 2014-12-09 DIAGNOSIS — E118 Type 2 diabetes mellitus with unspecified complications: Secondary | ICD-10-CM | POA: Diagnosis not present

## 2014-12-09 DIAGNOSIS — N183 Chronic kidney disease, stage 3 unspecified: Secondary | ICD-10-CM

## 2014-12-09 DIAGNOSIS — S81802D Unspecified open wound, left lower leg, subsequent encounter: Secondary | ICD-10-CM

## 2014-12-09 DIAGNOSIS — R627 Adult failure to thrive: Secondary | ICD-10-CM

## 2014-12-09 DIAGNOSIS — N138 Other obstructive and reflux uropathy: Secondary | ICD-10-CM

## 2014-12-09 NOTE — Progress Notes (Signed)
Patient ID: Gabriel Lynch, male   DOB: 1945/08/07, 69 y.o.   MRN: [id number]    Nursing Home Location:  [facility]   Code Status: Full Code   Place of Service: SNF (31)  PCP: [name], MD  Allergies  Allergen Reactions  . Influenza Vaccines Anaphylaxis    ON MAR  . Other Other (See Comments)    Seasonal (sinus) allergies    Chief Complaint  Patient presents with  . Medical Management of Chronic Issues    HPI:  Patient is a 70 y.o. male seen today at [facility] for routine follow up on chronic conditions. Pt with a hx of DM, wounds, htn, colostomy, hypothyroid, CKF, urinary retention. Pt s/p percutaneous cholecystomy with capped drain being followed by IR. Has been doing well in the last month. conts to follow with wound care due to slow healing Left LE wound. Vascular has been consulted.  Pt reports ongoing good appetite. No abdominal pain or discomfort.  Bowels moving well from ostomy.  No problems with urination.  Nursing without acute concerns.  Review of Systems:  Review of Systems  Constitutional: Negative for activity change, appetite change, fatigue and unexpected weight change.  HENT: Negative for congestion and hearing loss.   Eyes: Negative.   Respiratory: Negative for cough and shortness of breath.   Cardiovascular: Negative for chest pain, palpitations and leg swelling.  Gastrointestinal: Negative for abdominal pain, diarrhea and constipation.  Genitourinary: Negative for dysuria and difficulty urinating.  Musculoskeletal: Negative for myalgias and arthralgias.  Skin: Positive for wound. Negative for color change.       Right lower leg wound slowly improving  Neurological: Negative for dizziness and weakness.  Psychiatric/Behavioral: Negative for behavioral problems, confusion and agitation.    Past Medical History  Diagnosis Date  . Ulcerative colitis ([place])   . Diabetes mellitus without complication ([place])   .  Hypertension   . Iron deficiency anemia   . Hypothyroidism   . 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 Back surgery      multiple surgeries per family  . 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 Appendectomy    . Debridement of sacral decubitus ulcer  01-26-14   Social History:   reports that he quit smoking about 48 years ago. His smoking use included Cigarettes. He smoked 0.50 packs per day. He does not have any smokeless tobacco history on file. He reports that he does not drink alcohol or use illicit drugs.  No family history on file.  Medications: Patient's Medications  New Prescriptions   No medications on file  Previous Medications   AMINO ACIDS-PROTEIN HYDROLYS (FEEDING SUPPLEMENT, PRO-STAT SUGAR FREE 64,) LIQD    Take 30 mLs by mouth 2 (two) times daily.   AMLODIPINE (NORVASC) 10 MG TABLET    Take 10 mg by mouth daily.   ASPIRIN 81 MG TABLET    Take 81 mg by mouth daily.   CALCIUM ACETATE (PHOSLO) 667 MG CAPSULE    Take 667 mg by mouth daily.   CARVEDILOL (COREG) 12.5 MG TABLET    Take 12.5 mg by mouth 2 (two) times daily with a meal.   CHOLESTYRAMINE (QUESTRAN) 4 G PACKET    Take 1 packet (4 g total) by mouth daily.   FERROUS SULFATE 325 (65 FE) MG TABLET    Take 325 mg  by mouth daily with breakfast.   FINASTERIDE (PROSCAR) 5 MG TABLET    Take 1 tablet (5 mg total) by mouth daily.   INSULIN GLARGINE (LANTUS) 100 UNIT/ML INJECTION    Inject 0.08 mLs (8 Units total) into the skin at bedtime.   INSULIN LISPRO (HUMALOG) 100 UNIT/ML INJECTION    Inject 0-15 Units into the skin 3 (three) times daily before meals. Sliding scale   LEVOTHYROXINE (SYNTHROID, LEVOTHROID) 100 MCG TABLET    Take 100 mcg by mouth daily before breakfast.   MIRTAZAPINE (REMERON) 15 MG TABLET    Take 15 mg by mouth at bedtime.   MULTIPLE VITAMINS-MINERALS (MULTIVITAMIN WITH MINERALS) TABLET    Take 1 tablet by mouth daily.    OMEPRAZOLE (PRILOSEC) 20 MG CAPSULE    Take 20 mg by mouth daily.   ONDANSETRON (ZOFRAN) 4 MG TABLET    Take 4 mg by mouth every 4 (four) hours as needed for nausea or vomiting.   OVER THE COUNTER MEDICATION    Take 120 mLs by mouth 2 (two) times daily. Med pass   OXYCODONE (OXY IR/ROXICODONE) 5 MG IMMEDIATE RELEASE TABLET    Take 1 tablet (5 mg total) by mouth every 6 (six) hours as needed for moderate pain.   PROBIOTIC PRODUCT (PROBIOTIC DAILY) CAPS    Take 1 capsule by mouth daily.   TAMSULOSIN (FLOMAX) 0.4 MG CAPS CAPSULE    Take 2 capsules (0.8 mg total) by mouth every other day.  Modified Medications   No medications on file  Discontinued Medications   No medications on file     Physical Exam: Filed Vitals:   12/09/14 1416  BP: 136/72  Pulse: 80  Temp: 97.2 F (36.2 C)  Resp: 20  Weight: 184 lb (83.462 kg)    Physical Exam  Constitutional: He is oriented to person, place, and time. No distress.  HENT:  Head: Normocephalic and atraumatic.  Mouth/Throat: Oropharynx is clear and moist. No oropharyngeal exudate.  Eyes: Conjunctivae and EOM are normal. Pupils are equal, round, and reactive to light.  Neck: Normal range of motion. Neck supple.  Cardiovascular: Normal rate, regular rhythm and normal heart sounds.   Pulmonary/Chest: Effort normal and breath sounds normal.  Abdominal: Soft. Bowel sounds are normal.  Right chole drain Colostomy   Musculoskeletal: He exhibits no edema or tenderness.  Neurological: He is alert and oriented to person, place, and time.  Skin: Skin is warm and dry. He is not diaphoretic.  Leg wrapped, clean, dry, intact dsg   Psychiatric: He has a normal mood and affect.    Labs reviewed: Basic Metabolic Panel:  Recent Labs  04/28/14 0700  07/21/14 0430 07/22/14 0434 07/23/14 0532 07/24/14 0420 11/09/14  NA 134*  < > 133* 138 136 135 138  K 5.1  < > 3.2* 3.7 3.7 4.0 4.2  CL 105  < > 104 109 107 107  --   CO2 20  < > 21* 23 22 21*  --     GLUCOSE 160*  < > 95 70 70 126*  --   BUN 43*  < > 70* 53* 39* 38* 43*  CREATININE 1.28  < > 2.63* 2.12* 1.89* 2.02* 2.9*  CALCIUM 9.6  < > 10.0 9.4 9.3 9.2  --   MG 1.8  --   --  1.6* 2.0 1.8  --   PHOS 4.8*  --  1.5* 2.7  --   --   --   < > =  values in this interval not displayed. Liver Function Tests:  Recent Labs  03/06/14 0554  03/11/14 0700  07/20/14 0331 07/21/14 0430 07/22/14 0434  AST 27  --  19  --  21  --   --   ALT 20  --  16  --  28  --   --   ALKPHOS 296*  --  156*  --  167*  --   --   BILITOT 0.3  --  0.7  --  0.3  --   --   PROT 7.6  --  5.2*  --  7.3  --   --   ALBUMIN 2.6*  < > 1.8*  1.8*  < > 2.5* 2.5* 2.1*  < > = values in this interval not displayed.  Recent Labs  03/07/14 1000 03/09/14 0900  LIPASE 193* 197*   No results for input(s): AMMONIA in the last 8760 hours. CBC:  Recent Labs  04/15/14 0519  04/24/14 0539  07/18/14 2218  07/21/14 0430 07/23/14 0532 07/24/14 0420 11/09/14  WBC 11.9*  < > 11.5*  < > 14.6*  < > 15.3* 13.5* 14.5* 11.7  NEUTROABS 7.8*  --  6.8  --  8.7*  --   --   --   --   --   HGB 12.4*  < > 12.9*  < > 9.9*  < > 8.5* 8.0* 7.7* 10.2*  HCT 39.0  < > 42.8  < > 31.9*  < > 26.8* 26.2* 24.2* 31*  MCV 90.1  < > 91.6  < > 95.5  < > 95.4 96.0 96.4  --   PLT 504*  < > 611*  < > 723*  < > 577* 535* 513* 311  < > = values in this interval not displayed. TSH:  Recent Labs  03/06/14 0721 07/22/14 0434 07/27/14  TSH 1.174 1.455 1.53   A1C: Lab Results  Component Value Date   HGBA1C 6.3* 03/16/2014   Lipid Panel: No results for input(s): CHOL, HDL, LDLCALC, TRIG, CHOLHDL, LDLDIRECT in the last 8760 hours. CBC (No Diff) Darnelle Going SID: [id number] Final - Approved 10/05/2014 11:24AM EDT Collected: 10/05/2014 4:19AM EDT TEST FLAG RESULT REF RANGE UNIT WBC 10.3 4.5-10.8 10*3/L RBC L 3.47 4.00-6.60 10*6/L Hematocrit L 34.9 45.0-54.0 % Hemoglobin L 11.5 14.0-18.0 g/dL MCV H 100.6 80.0-100.0 fL MCH 33.2 26.0-35.0  pg MCHC 32.9 31.0-36.5 g/dL RDW 14.5 11.0-16.0 % Platelet Count 357.0 150.0-450.0 10*3/L MPV 8.4 6.5-12.0 fL  Hemoglobin A1c 5.7  4.1-6.1 %  TEST FLAG RESULT REF RANGE UNIT Albumin L 3.2 3.5-5.7 g/dL Albumin/Globulin Ratio L 0.8 1.2-1.8 g/dL Alkaline Phosphatase H 107 34-104 U/L ALT (SGPT) 22 7-52 U/L AST (SGOT) 20 13-39 U/L Bicarbonate (CO2) 24 21-31 mmol/L Bilirubin, Total 0.3 0.3-1.0 mg/dL BUN (Urea Nitrogen) H 38 7-25 mg/dL BUN/Cr Ratio 17 11-19 mg/dL Calcium 9.8 8.6-10.3 mg/dL Chloride 107 98-107 mmol/L Creatinine, Serum H 2.3 0.6-1.3 mg/dL Glucose 98 65-99 mg/dL Fasting: 65-99 mg/d Non-fasting: 65-125 mg/dL Potassium 4.1 3.5-5.1 mmol/L Protein, Total 7.0 6.4-8.9 g/dL Sodium 136 136-145 mmol/L Osmolality Calculated 281 268-294 mOsm/kg eGFR African L 34 >59 mL/min/1.73 m2  Assessment/Plan 1. CKD (chronic kidney disease) stage 3, GFR 30-59 ml/min Worsening kidney disease, Cr now at 2.9  -will consult nephrology for further evaluation at this time  2. Anemia, chronic disease Likely due to CKD, stable at this time.  conts on iron daily  3. BPH (benign prostatic hypertrophy) with urinary obstruction Without obstruction at this time, doing well  with proscar and tamsulosin  4. FTT (failure to thrive) in adult Weight has been trending up and eating well at this time. Conts with low albumin and wounds to LE followed by wound care and vascular at this time.  5. DM type 2, controlled, with complication ([place]) Diabetes well controlled on current regimen. Blood sugars in appropriate range. Will cont current regimen   6. Essential hypertension -well controlled on amlodipine, carvedilol and doxazosin  7. Wound of left lower extremity, subsequent encounter Slowly improving, following with vascular, wound care team and treatment nurse. Currently using silver alginate daily for dressing changes.   Carlos American. Harle Battiest  [facility] [phone] 8 am - 5 pm) [phone] (after hours)

## 2014-12-28 ENCOUNTER — Ambulatory Visit (HOSPITAL_COMMUNITY)
Admission: RE | Admit: 2014-12-28 | Discharge: 2014-12-28 | Disposition: A | Discharge status: Home / Self Care | Payer: Medicare Other | Source: Ambulatory Visit | Attending: General Surgery | Admitting: General Surgery

## 2014-12-28 ENCOUNTER — Non-Acute Institutional Stay (SKILLED_NURSING_FACILITY): Payer: Medicare Other | Admitting: Internal Medicine

## 2014-12-28 ENCOUNTER — Encounter: Payer: Self-pay | Admitting: Internal Medicine

## 2014-12-28 DIAGNOSIS — K810 Acute cholecystitis: Secondary | ICD-10-CM | POA: Diagnosis not present

## 2014-12-28 DIAGNOSIS — E118 Type 2 diabetes mellitus with unspecified complications: Secondary | ICD-10-CM

## 2014-12-28 DIAGNOSIS — R197 Diarrhea, unspecified: Secondary | ICD-10-CM

## 2014-12-28 DIAGNOSIS — E034 Atrophy of thyroid (acquired): Secondary | ICD-10-CM | POA: Diagnosis not present

## 2014-12-28 DIAGNOSIS — E038 Other specified hypothyroidism: Secondary | ICD-10-CM

## 2014-12-28 DIAGNOSIS — Z933 Colostomy status: Secondary | ICD-10-CM

## 2014-12-28 DIAGNOSIS — N183 Chronic kidney disease, stage 3 unspecified: Secondary | ICD-10-CM

## 2014-12-28 DIAGNOSIS — K819 Cholecystitis, unspecified: Secondary | ICD-10-CM

## 2014-12-28 DIAGNOSIS — I10 Essential (primary) hypertension: Secondary | ICD-10-CM | POA: Diagnosis not present

## 2014-12-28 DIAGNOSIS — S81802D Unspecified open wound, left lower leg, subsequent encounter: Secondary | ICD-10-CM

## 2014-12-28 DIAGNOSIS — Z4682 Encounter for fitting and adjustment of non-vascular catheter: Secondary | ICD-10-CM | POA: Insufficient documentation

## 2014-12-28 DIAGNOSIS — G894 Chronic pain syndrome: Secondary | ICD-10-CM | POA: Diagnosis not present

## 2014-12-28 DIAGNOSIS — R627 Adult failure to thrive: Secondary | ICD-10-CM | POA: Diagnosis not present

## 2014-12-28 DIAGNOSIS — K8000 Calculus of gallbladder with acute cholecystitis without obstruction: Secondary | ICD-10-CM | POA: Diagnosis not present

## 2014-12-28 DIAGNOSIS — N401 Enlarged prostate with lower urinary tract symptoms: Secondary | ICD-10-CM

## 2014-12-28 DIAGNOSIS — N138 Other obstructive and reflux uropathy: Secondary | ICD-10-CM

## 2014-12-28 MED ORDER — IOHEXOL 300 MG/ML  SOLN
50.0000 mL | Freq: Once | INTRAMUSCULAR | Status: AC | PRN
Start: 2014-12-28 — End: 2014-12-28
  Administered 2014-12-28: 6 mL

## 2014-12-29 NOTE — Progress Notes (Signed)
Patient ID: Gabriel Lynch, male   DOB: 04/14/1945, 69 y.o.   MRN: [id number]    DATE:12/28/14  Location:  [facility]    Place of Service: SNF (31)   Extended Emergency Contact Information Primary Emergency Contact: Sharon Seller [place] Phone: [phone] Relation: Son Secondary Emergency Contact: Rudene Anda [place] of [place] Phone: [phone] Relation: Sister  Advanced Directive information Does patient have an advance directive?: Yes, Type of Advance Directive: FULL CODE  Chief Complaint  Patient presents with  . Medical Management of Chronic Issues    HPI:  69 yo male long term resident deen today for f/u. He c/o loose stools, watery but no blood. He denies N/V, abdominal pain, f/c. He went and had cholecystostomy tube drain changed today. He has f/u with surgery later this week to discuss gallbladder removal and reversal of colostomy. Appetite ok and sleeping well most days. No nursing issues. No falls.  UC - s/p colectomy and colostomy. He has chronic loose stools and is taking questran  DM - BS stable on lantus and humalog SSI. No low BS reactions  HTN - BP stable on amlodipine, coreg. He takes ASA daily  Fe deficiency anemia - stable on iron supplement  BPH - sx's stable on flomax and proscar  FTT - he receives nutritional supplement per facility protocol. Takes probiotic daily and MVI daily. Takes remeron  Chronic LLE wound - followed by wound care team. He takes prostat  Hypothyroidism - stable on levothyroxine  GERD - stable on omeprazole. He takes zofran  CKD stage 4 - Cr stable at 2.9  Chronic pain/joint pain- stable on OxyIR   Past Medical History  Diagnosis Date  . Ulcerative colitis ([place])   . Diabetes mellitus without complication ([place])   . Hypertension   . Iron deficiency anemia   . Hypothyroidism   . Blindness of right eye     sp prosthesis distant past    Past Surgical  History  Procedure Laterality Date  . Back surgery      multiple surgeries per family  . 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 Appendectomy    . Debridement of sacral decubitus ulcer  01-26-14    Patient Care Team: [name], MD as PCP - General (Internal Medicine)  Social History   Social History  . Marital Status: Unknown    Spouse Name: N/A  . Number of Children: N/A  . Years of Education: N/A   Occupational History  . Not on file.   Social History Main Topics  . Smoking status: Former Smoker -- 0.50 packs/day    Types: Cigarettes    Quit date: 07/24/1966  . Smokeless tobacco: Not on file  . Alcohol Use: No  . Drug Use: No     Comment: Patient stated he quit smoking 40 years ago. Exact   . Sexual Activity: Not on file   Other Topics Concern  . Not on file   Social History Narrative     reports that he quit smoking about 48 years ago. His smoking use included Cigarettes. He smoked 0.50 packs per day. He does not have any smokeless tobacco history on file. He reports that he does not drink alcohol or use illicit drugs.  Immunization History  Administered Date(s) Administered  . PPD Test 08/12/2014    Allergies  Allergen Reactions  . Influenza Vaccines Anaphylaxis    ON MAR  . Other Other (See  Comments)    Seasonal (sinus) allergies    Medications: Patient's Medications  New Prescriptions   No medications on file  Previous Medications   AMINO ACIDS-PROTEIN HYDROLYS (FEEDING SUPPLEMENT, PRO-STAT SUGAR FREE 64,) LIQD    Take 30 mLs by mouth 2 (two) times daily.   AMLODIPINE (NORVASC) 10 MG TABLET    Take 10 mg by mouth daily.   ASPIRIN 81 MG TABLET    Take 81 mg by mouth daily.   CALCIUM ACETATE (PHOSLO) 667 MG CAPSULE    Take 667 mg by mouth daily.   CARVEDILOL (COREG) 12.5 MG TABLET    Take 12.5 mg by mouth 2 (two) times daily with a meal.   CHOLESTYRAMINE (QUESTRAN) 4 G PACKET    Take 1 packet (4 g total) by mouth daily.   FERROUS SULFATE  325 (65 FE) MG TABLET    Take 325 mg by mouth daily with breakfast.   FINASTERIDE (PROSCAR) 5 MG TABLET    Take 1 tablet (5 mg total) by mouth daily.   INSULIN GLARGINE (LANTUS) 100 UNIT/ML INJECTION    Inject 0.08 mLs (8 Units total) into the skin at bedtime.   INSULIN LISPRO (HUMALOG) 100 UNIT/ML INJECTION    Inject 0-15 Units into the skin 3 (three) times daily before meals. Sliding scale   LEVOTHYROXINE (SYNTHROID, LEVOTHROID) 100 MCG TABLET    Take 100 mcg by mouth daily before breakfast.   MIRTAZAPINE (REMERON) 15 MG TABLET    Take 15 mg by mouth at bedtime.   MULTIPLE VITAMINS-MINERALS (MULTIVITAMIN WITH MINERALS) TABLET    Take 1 tablet by mouth daily.   OMEPRAZOLE (PRILOSEC) 20 MG CAPSULE    Take 20 mg by mouth daily.   ONDANSETRON (ZOFRAN) 4 MG TABLET    Take 4 mg by mouth every 4 (four) hours as needed for nausea or vomiting.   OVER THE COUNTER MEDICATION    Take 120 mLs by mouth 2 (two) times daily. Med pass   OXYCODONE (OXY IR/ROXICODONE) 5 MG IMMEDIATE RELEASE TABLET    Take 1 tablet (5 mg total) by mouth every 6 (six) hours as needed for moderate pain.   PROBIOTIC PRODUCT (PROBIOTIC DAILY) CAPS    Take 1 capsule by mouth daily.   TAMSULOSIN (FLOMAX) 0.4 MG CAPS CAPSULE    Take 2 capsules (0.8 mg total) by mouth every other day.  Modified Medications   No medications on file  Discontinued Medications   No medications on file    Review of Systems  Constitutional: Positive for appetite change. Negative for chills, activity change and fatigue.  HENT: Negative for sore throat and trouble swallowing.   Eyes: Negative for visual disturbance.  Respiratory: Negative for cough, chest tightness and shortness of breath.   Cardiovascular: Negative for chest pain, palpitations and leg swelling.  Gastrointestinal: Positive for diarrhea. Negative for nausea, vomiting, abdominal pain and blood in stool.  Genitourinary: Negative for urgency, frequency and difficulty urinating.    Musculoskeletal: Positive for arthralgias. Negative for gait problem.  Skin: Positive for wound. Negative for rash.  Neurological: Negative for weakness and headaches.  Psychiatric/Behavioral: Negative for confusion and sleep disturbance. The patient is not nervous/anxious.     Filed Vitals:   12/28/14 1554  BP: 138/72  Pulse: 77  Temp: 98.1 F (36.7 C)  TempSrc: Oral  Resp: 18  Weight: 175 lb (79.379 kg)   Body mass index is 22.46 kg/(m^2).  Physical Exam  Constitutional: He is oriented to person, place, and time.  He appears well-developed.  Frail appearing, lying in bed  HENT:  Mouth/Throat: Oropharynx is clear and moist.  Eyes: Pupils are equal, round, and reactive to light. No scleral icterus.  Neck: Neck supple. Carotid bruit is not present. No thyromegaly present.  Cardiovascular: Normal rate, regular rhythm and intact distal pulses.  Exam reveals no gallop and no friction rub.   Murmur (1/6 SEM) heard. no distal LE swelling. No calf TTP  Pulmonary/Chest: Effort normal and breath sounds normal. He has no wheezes. He has no rales. He exhibits no tenderness.  Abdominal: Soft. Bowel sounds are normal. He exhibits no distension, no abdominal bruit, no pulsatile midline mass and no mass. There is no tenderness. There is no rebound and no guarding.  Colostomy intact; no bloody stool noted; right sided cholecystostomy drain intact with dsg c/d/i  Musculoskeletal: He exhibits edema and tenderness.  Lymphadenopathy:    He has no cervical adenopathy.  Neurological: He is alert and oriented to person, place, and time.  Skin: Skin is warm and dry. No rash noted.  Psychiatric: He has a normal mood and affect. His behavior is normal. Thought content normal.     Labs reviewed: Nursing Home on 12/09/2014  Component Date Value Ref Range Status  . Hemoglobin 11/09/2014 10.2* 13.5 - 17.5 g/dL Final  . HCT 11/09/2014 31* 41 - 53 % Final  . Platelets 11/09/2014 311  150 - 399 K/L  Final  . WBC 11/09/2014 11.7   Final  . Glucose 11/09/2014 147   Final  . BUN 11/09/2014 43* 4 - 21 mg/dL Final  . Creatinine 11/09/2014 2.9* 0.6 - 1.3 mg/dL Final  . Potassium 11/09/2014 4.2  3.4 - 5.3 mmol/L Final  . Sodium 11/09/2014 138  137 - 147 mmol/L Final  Nursing Home on 10/02/2014  Component Date Value Ref Range Status  . TSH 07/27/2014 1.53  .41 - 5.90 uIU/mL Final    Ir Catheter Tube Change  12/28/2014  CLINICAL DATA:  Acute calculus cholecystitis with percutaneous cholecystostomy catheter placement 03/06/2014. Patient has been tolerating internal drainage with the catheter capped. Routine scheduled exchange EXAM: PERCUTANEOUS CHOLECYSTOSTOMY CATHETER EXCHANGE UNDER FLUOROSCOPY FLUOROSCOPY TIME:  0.2 minutes, 8 uGym2 DAP TECHNIQUE: The procedure, risks (including but not limited to bleeding, infection, organ damage ), benefits, and alternatives were explained to the patient. Questions regarding the procedure were encouraged and answered. The patient understands and consents to the procedure. The cholecystostomy catheter and surrounding skin were prepped with Betadine, draped in usual sterile fashion. A small amount of contrast was injected through the catheter to opacify the lumen of the nondistended gallbladder. A large calculus was identified. The catheter was cut and exchanged over a 0.035" angiographic wire for a new 10-French catheter, formed centrally within the gallbladder under fluoroscopy. Catheter secured externally with a StatLock device. Catheter was capped to allow internal drainage as before. The patient tolerated the procedure well. Complications: None. IMPRESSION: 1. Technically successful exchange of cholecystostomy catheter under fluoroscopy Electronically Signed   By: Lucrezia Europe M.D.   On: 12/28/2014 09:38     Assessment/Plan   ICD-9-CM ICD-10-CM   1. Frequent loose stools multifactorial including #3, 2  787.91 R19.7   2. Colostomy in place [facility]) s/p colectomy due  to UC V44.3 Z93.3   3. Acute cholecystitis s/p cholecystostomy drain 575.0 K81.0   4. DM type 2, controlled, with complication ([place]) - stable 250.90 E11.8   5. Essential hypertension - stable 401.9 I10   6. FTT (failure to  thrive) in adult - stable 783.7 R62.7   7. BPH (benign prostatic hypertrophy) with urinary obstruction - stable 600.01 N40.1    599.69    8. Wound of left lower extremity, subsequent encounter - chronic V58.89 S81.802D    894.0    9. Hypothyroidism due to acquired atrophy of thyroid - stable 244.8 E03.8    246.8 E03.4   10. CKD (chronic kidney disease) stage 3, GFR 30-59 ml/min - stable 585.3 N18.3   11. Chronic pain syndrome - controlled 338.4 G89.4     T/c imodium for loose bowel.he is already on [place]  F/u with surgery as scheduled  Cont current meds as ordered  Wound care to follow  Cont nutritional supplements as ordered  PT/OT as indicated. ST as indicated  Financial form completed for social security  Will follow  Cordella Register. Perlie Gold  [facility] [address] [place], [place] [address] [phone] Cell (Monday-Friday 8 AM - 5 PM) [phone] After 5 PM and follow prompts

## 2015-01-25 LAB — HEMOGLOBIN A1C
HEMOGLOBIN A1C: 5.7
Hemoglobin A1C: 7

## 2015-01-25 LAB — TSH: TSH: 2.47 u[IU]/mL (ref 0.41–5.90)

## 2015-02-10 ENCOUNTER — Other Ambulatory Visit: Payer: Self-pay | Admitting: General Surgery

## 2015-02-10 DIAGNOSIS — Z932 Ileostomy status: Secondary | ICD-10-CM

## 2015-02-11 ENCOUNTER — Other Ambulatory Visit: Payer: Self-pay | Admitting: General Surgery

## 2015-02-11 DIAGNOSIS — Z932 Ileostomy status: Secondary | ICD-10-CM

## 2015-02-19 ENCOUNTER — Non-Acute Institutional Stay (SKILLED_NURSING_FACILITY): Payer: Medicare Other | Admitting: Nurse Practitioner

## 2015-02-19 ENCOUNTER — Encounter: Payer: Self-pay | Admitting: Nurse Practitioner

## 2015-02-19 DIAGNOSIS — E038 Other specified hypothyroidism: Secondary | ICD-10-CM

## 2015-02-19 DIAGNOSIS — S81802D Unspecified open wound, left lower leg, subsequent encounter: Secondary | ICD-10-CM

## 2015-02-19 DIAGNOSIS — N401 Enlarged prostate with lower urinary tract symptoms: Secondary | ICD-10-CM | POA: Diagnosis not present

## 2015-02-19 DIAGNOSIS — I10 Essential (primary) hypertension: Secondary | ICD-10-CM

## 2015-02-19 DIAGNOSIS — E034 Atrophy of thyroid (acquired): Secondary | ICD-10-CM | POA: Diagnosis not present

## 2015-02-19 DIAGNOSIS — R627 Adult failure to thrive: Secondary | ICD-10-CM | POA: Diagnosis not present

## 2015-02-19 DIAGNOSIS — E118 Type 2 diabetes mellitus with unspecified complications: Secondary | ICD-10-CM

## 2015-02-19 DIAGNOSIS — N138 Other obstructive and reflux uropathy: Secondary | ICD-10-CM

## 2015-02-19 NOTE — Progress Notes (Signed)
Patient ID: Gabriel Lynch, male   DOB: Jan 16, 1946, 70 y.o.   MRN: [id number]    Nursing Home Location:  [facility]   Code Status: Full Code   Place of Service: SNF (31)  PCP: [name], MD  Allergies  Allergen Reactions  . Influenza Vaccines Anaphylaxis    ON MAR  . Other Other (See Comments)    Seasonal (sinus) allergies    Chief Complaint  Patient presents with  . Medical Management of Chronic Issues    Routine Visit    HPI:  Patient is a 70 y.o. male seen today at [facility] for routine follow up on chronic conditions. Pt with a hx of DM, wounds, htn, colostomy, hypothyroid, CKF, urinary retention. Pt s/p percutaneous cholecystomy with capped drain being followed by IR and surgery. Pt has been doing very well. conts to gain weight and eat well. Has recently followed with surgery and plans are to reverse colostomy and remove chole drain. Pt denies any abdominal pain. Colostomy draining well. Ongoing wound care to left lower leg. Wound has been stable. Pt has no complaints. Reports he is now walking without walker at times.   Review of Systems:  Review of Systems  Constitutional: Negative for activity change, appetite change, fatigue and unexpected weight change.  HENT: Negative for congestion and hearing loss.   Eyes: Negative.   Respiratory: Negative for cough and shortness of breath.   Cardiovascular: Negative for chest pain, palpitations and leg swelling.  Gastrointestinal: Negative for abdominal pain, diarrhea and constipation.  Genitourinary: Negative for dysuria and difficulty urinating.  Musculoskeletal: Negative for myalgias and arthralgias.  Skin: Positive for wound. Negative for color change.       Right lower leg wound slowly improving  Neurological: Negative for dizziness and weakness.  Psychiatric/Behavioral: Negative for behavioral problems, confusion and agitation.    Past Medical History  Diagnosis Date  . Ulcerative  colitis ([place])   . Diabetes mellitus without complication ([place])   . Hypertension   . Iron deficiency anemia   . Hypothyroidism   . 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 Back surgery      multiple surgeries per family  . 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 Appendectomy    . Debridement of sacral decubitus ulcer  01-26-14   Social History:   reports that he quit smoking about 48 years ago. His smoking use included Cigarettes. He smoked 0.50 packs per day. He does not have any smokeless tobacco history on file. He reports that he does not drink alcohol or use illicit drugs.  History reviewed. No pertinent family history.  Medications: Patient's Medications  New Prescriptions   No medications on file  Previous Medications   AMINO ACIDS-PROTEIN HYDROLYS (FEEDING SUPPLEMENT, PRO-STAT SUGAR FREE 64,) LIQD    Take 30 mLs by mouth 2 (two) times daily.   AMLODIPINE (NORVASC) 10 MG TABLET    Take 10 mg by mouth daily.   ASPIRIN 81 MG TABLET    Take 81 mg by mouth daily.   BISACODYL (DULCOLAX) 10 MG SUPPOSITORY    If not relieved by milk of magnesium give 10 mg Bisacodyl suppository rectally X 1 dose in 24 hours as needed   CALCIUM ACETATE (PHOSLO) 667 MG CAPSULE    Take 667 mg by mouth daily.   CARVEDILOL (COREG) 12.5 MG TABLET  Take 12.5 mg by mouth 2 (two) times daily with a meal.   CHOLESTYRAMINE (QUESTRAN) 4 G PACKET    Take 1 packet (4 g total) by mouth daily.   FERROUS SULFATE 325 (65 FE) MG TABLET    Take 325 mg by mouth daily with breakfast.   FINASTERIDE (PROSCAR) 5 MG TABLET    Take 1 tablet (5 mg total) by mouth daily.   INSULIN GLARGINE (LANTUS) 100 UNIT/ML INJECTION    Inject 0.08 mLs (8 Units total) into the skin at bedtime.   INSULIN LISPRO (HUMALOG) 100 UNIT/ML INJECTION    Sliding scale  0-150=OU 151-200=2U 201-250=4U 251-300=6U 301-350=8U 351-400=10U >400=12U   LEVOTHYROXINE (SYNTHROID,  LEVOTHROID) 100 MCG TABLET    Take 100 mcg by mouth daily before breakfast.   MAGNESIUM HYDROXIDE (MILK OF MAGNESIA PO)    Take by mouth. If no BM in 3 days, give 30 cc of milk of magnesium by mouth x 1 dose in 24 hour as needed   MIRTAZAPINE (REMERON) 15 MG TABLET    Take 15 mg by mouth at bedtime.   MULTIPLE VITAMINS-MINERALS (MULTIVITAMIN WITH MINERALS) TABLET    Take 1 tablet by mouth daily.   OMEPRAZOLE (PRILOSEC) 20 MG CAPSULE    Take 20 mg by mouth daily. For GERD   ONDANSETRON (ZOFRAN) 4 MG TABLET    Take 4 mg by mouth every 4 (four) hours as needed for nausea or vomiting.   OXYCODONE (OXY IR/ROXICODONE) 5 MG IMMEDIATE RELEASE TABLET    Take 1 tablet (5 mg total) by mouth every 6 (six) hours as needed for moderate pain.   PROBIOTIC PRODUCT (PROBIOTIC DAILY) CAPS    Take 1 capsule by mouth daily.   TAMSULOSIN (FLOMAX) 0.4 MG CAPS CAPSULE    Take 2 capsules (0.8 mg total) by mouth every other day.  Modified Medications   No medications on file  Discontinued Medications   No medications on file     Physical Exam: Filed Vitals:   02/19/15 1129  BP: 138/72  Pulse: 66  Temp: 98.2 F (36.8 C)  Resp: 20  Height: 6\' 2"  (1.88 m)  Weight: 186 lb 6.4 oz (84.55 kg)    Physical Exam  Constitutional: He is oriented to person, place, and time. No distress.  HENT:  Head: Normocephalic and atraumatic.  Mouth/Throat: Oropharynx is clear and moist. No oropharyngeal exudate.  Eyes: Conjunctivae and EOM are normal. Pupils are equal, round, and reactive to light.  Neck: Normal range of motion. Neck supple.  Cardiovascular: Normal rate, regular rhythm and normal heart sounds.   Pulmonary/Chest: Effort normal and breath sounds normal.  Abdominal: Soft. Bowel sounds are normal.  Right chole drain Colostomy   Musculoskeletal: He exhibits no edema or tenderness.  Neurological: He is alert and oriented to person, place, and time.  Skin: Skin is warm and dry. He is not diaphoretic.  Leg wrapped,  clean, dry, intact dsg   Psychiatric: He has a normal mood and affect.    Labs reviewed: Basic Metabolic Panel:  Recent Labs  04/28/14 0700  07/21/14 0430 07/22/14 0434 07/23/14 0532 07/24/14 0420 11/09/14  NA 134*  < > 133* 138 136 135 138  K 5.1  < > 3.2* 3.7 3.7 4.0 4.2  CL 105  < > 104 109 107 107  --   CO2 20  < > 21* 23 22 21*  --   GLUCOSE 160*  < > 95 70 70 126*  --   BUN 43*  < >  70* 53* 39* 38* 43*  CREATININE 1.28  < > 2.63* 2.12* 1.89* 2.02* 2.9*  CALCIUM 9.6  < > 10.0 9.4 9.3 9.2  --   MG 1.8  --   --  1.6* 2.0 1.8  --   PHOS 4.8*  --  1.5* 2.7  --   --   --   < > = values in this interval not displayed. Liver Function Tests:  Recent Labs  03/06/14 0554  03/11/14 0700  07/20/14 0331 07/21/14 0430 07/22/14 0434  AST 27  --  19  --  21  --   --   ALT 20  --  16  --  28  --   --   ALKPHOS 296*  --  156*  --  167*  --   --   BILITOT 0.3  --  0.7  --  0.3  --   --   PROT 7.6  --  5.2*  --  7.3  --   --   ALBUMIN 2.6*  < > 1.8*  1.8*  < > 2.5* 2.5* 2.1*  < > = values in this interval not displayed.  Recent Labs  03/07/14 1000 03/09/14 0900  LIPASE 193* 197*   No results for input(s): AMMONIA in the last 8760 hours. CBC:  Recent Labs  04/15/14 0519  04/24/14 0539  07/18/14 2218  07/21/14 0430 07/23/14 0532 07/24/14 0420 11/09/14  WBC 11.9*  < > 11.5*  < > 14.6*  < > 15.3* 13.5* 14.5* 11.7  NEUTROABS 7.8*  --  6.8  --  8.7*  --   --   --   --   --   HGB 12.4*  < > 12.9*  < > 9.9*  < > 8.5* 8.0* 7.7* 10.2*  HCT 39.0  < > 42.8  < > 31.9*  < > 26.8* 26.2* 24.2* 31*  MCV 90.1  < > 91.6  < > 95.5  < > 95.4 96.0 96.4  --   PLT 504*  < > 611*  < > 723*  < > 577* 535* 513* 311  < > = values in this interval not displayed. TSH:  Recent Labs  03/06/14 0721 07/22/14 0434 07/27/14  TSH 1.174 1.455 1.53   A1C: Lab Results  Component Value Date   HGBA1C 6.3* 03/16/2014   Lipid Panel: No results for input(s): CHOL, HDL, LDLCALC, TRIG, CHOLHDL,  LDLDIRECT in the last 8760 hours.  Assessment/Plan 1. FTT (failure to thrive) in adult Improved. Pt with good positive weight gain over the last few months with increase in mobility and nutrition.   2. BPH (benign prostatic hypertrophy) with urinary obstruction -stable, conts on proscare and flomax   3. Essential hypertension -blood pressure remains stable, conts on corege, norvasc  4. DM type 2, controlled, with complication ([place]) conts on lantus and humalog SSI, will follow up A1c at this time  5. Wound of left lower extremity, subsequent encounter Remains stable, conts to be followed by treatment team  6. Hypothyroidism due to acquired atrophy of thyroid -will follow up TSH, pt conts on synthroid 100 mcgs   Kenlie Seki K. Harle Battiest  [facility] [phone] 8 am - 5 pm) [phone] (after hours)

## 2015-02-22 LAB — BASIC METABOLIC PANEL
BUN: 40 mg/dL — AB (ref 4–21)
CREATININE: 2.4 mg/dL — AB (ref 0.6–1.3)
GLUCOSE: 131 mg/dL
POTASSIUM: 4.2 mmol/L (ref 3.4–5.3)
SODIUM: 139 mmol/L (ref 137–147)

## 2015-02-22 LAB — CBC AND DIFFERENTIAL
HEMATOCRIT: 40 % — AB (ref 41–53)
HEMOGLOBIN: 13.1 g/dL — AB (ref 13.5–17.5)
Platelets: 333 10*3/uL (ref 150–399)
WBC: 10.9 10^3/mL

## 2015-02-22 LAB — HEMOGLOBIN A1C: Hemoglobin A1C: 6.5

## 2015-02-22 LAB — HEPATIC FUNCTION PANEL
ALT: 29 U/L (ref 10–40)
AST: 27 U/L (ref 14–40)
Alkaline Phosphatase: 126 U/L — AB (ref 25–125)
Bilirubin, Total: 0.4 mg/dL

## 2015-03-03 ENCOUNTER — Other Ambulatory Visit: Payer: Self-pay | Admitting: General Surgery

## 2015-03-03 DIAGNOSIS — K8010 Calculus of gallbladder with chronic cholecystitis without obstruction: Secondary | ICD-10-CM

## 2015-03-03 DIAGNOSIS — Z932 Ileostomy status: Secondary | ICD-10-CM

## 2015-03-08 ENCOUNTER — Other Ambulatory Visit: Payer: Self-pay | Admitting: General Surgery

## 2015-03-08 ENCOUNTER — Telehealth: Payer: Self-pay | Admitting: Internal Medicine

## 2015-03-08 ENCOUNTER — Ambulatory Visit
Admission: RE | Admit: 2015-03-08 | Discharge: 2015-03-08 | Disposition: A | Discharge status: Home / Self Care | Payer: Medicare Other | Source: Ambulatory Visit | Attending: General Surgery | Admitting: General Surgery

## 2015-03-08 DIAGNOSIS — Z932 Ileostomy status: Secondary | ICD-10-CM

## 2015-03-08 DIAGNOSIS — K8010 Calculus of gallbladder with chronic cholecystitis without obstruction: Secondary | ICD-10-CM

## 2015-03-08 NOTE — Telephone Encounter (Signed)
[facility] requesting pt have flex-sig asap, being evaluated for possible reanastomosis. Note in epic where pt was seen in the hospital in July by Dr. Henrene Pastor. Would you want pt seen in office for an OV or scheduled for direct flex-sig. Please advise.

## 2015-03-08 NOTE — Telephone Encounter (Signed)
Very limited contact with patient seen only in hospital unassigned. Has a primary GI doc elsewhere. I need to know who wants flex sig and why. Need notes from requesting physician. Will likely need OV after that information has been reviewed.

## 2015-03-10 NOTE — Telephone Encounter (Signed)
Procedure being requested by Dr. Georganna Skeans for possible reanastomosis. Notes being faxed by [facility].

## 2015-03-10 NOTE — Telephone Encounter (Signed)
[facility] notes on Dr. Blanch Media desk for review.

## 2015-03-11 NOTE — Telephone Encounter (Signed)
Gabriel Lynch, I need complete surgical operative note and complete surgical pathology reports from [facility] a proximally 1 year ago (this is where we were told that he had his total colectomy for ulcerative colitis). I need to review this to determine if any endoscopic intervention is warranted. Thanks

## 2015-03-12 NOTE — Telephone Encounter (Signed)
Records on Dr. Perry's desk for review. 

## 2015-03-12 NOTE — Telephone Encounter (Signed)
Records requested from [facility] in [place].

## 2015-03-22 ENCOUNTER — Non-Acute Institutional Stay (SKILLED_NURSING_FACILITY): Payer: Medicare Other | Admitting: Internal Medicine

## 2015-03-22 ENCOUNTER — Encounter: Payer: Self-pay | Admitting: Internal Medicine

## 2015-03-22 DIAGNOSIS — N138 Other obstructive and reflux uropathy: Secondary | ICD-10-CM

## 2015-03-22 DIAGNOSIS — E034 Atrophy of thyroid (acquired): Secondary | ICD-10-CM | POA: Insufficient documentation

## 2015-03-22 DIAGNOSIS — R197 Diarrhea, unspecified: Secondary | ICD-10-CM

## 2015-03-22 DIAGNOSIS — N401 Enlarged prostate with lower urinary tract symptoms: Secondary | ICD-10-CM | POA: Diagnosis not present

## 2015-03-22 DIAGNOSIS — K810 Acute cholecystitis: Secondary | ICD-10-CM | POA: Diagnosis not present

## 2015-03-22 DIAGNOSIS — D638 Anemia in other chronic diseases classified elsewhere: Secondary | ICD-10-CM

## 2015-03-22 DIAGNOSIS — R627 Adult failure to thrive: Secondary | ICD-10-CM | POA: Diagnosis not present

## 2015-03-22 DIAGNOSIS — I10 Essential (primary) hypertension: Secondary | ICD-10-CM

## 2015-03-22 DIAGNOSIS — E038 Other specified hypothyroidism: Secondary | ICD-10-CM | POA: Diagnosis not present

## 2015-03-22 DIAGNOSIS — E118 Type 2 diabetes mellitus with unspecified complications: Secondary | ICD-10-CM | POA: Diagnosis not present

## 2015-03-22 DIAGNOSIS — Z933 Colostomy status: Secondary | ICD-10-CM

## 2015-03-22 DIAGNOSIS — G894 Chronic pain syndrome: Secondary | ICD-10-CM | POA: Diagnosis not present

## 2015-03-22 DIAGNOSIS — S81802S Unspecified open wound, left lower leg, sequela: Secondary | ICD-10-CM | POA: Diagnosis not present

## 2015-03-22 NOTE — Progress Notes (Signed)
Patient ID: Gabriel Lynch, male   DOB: [birth year], 70 y.o.   MRN: [id number]    DATE: 03/22/15  Location:  [facility]    Place of Service: SNF (31)   Extended Emergency Contact Information Primary Emergency Contact: Almanza,Meishel  [place] Phone: [phone] Relation: Son Secondary Emergency Contact: Rudene Anda [place] Phone: [phone] Relation: Sister  Advanced Directive information Does patient have an advance directive?: Yes, Type of Advance Directive: Healthcare Power of [place];Living will, Does patient want to make changes to advanced directive?: No - Patient declined  Chief Complaint  Patient presents with  . Medical Management of Chronic Issues    HPI:  70 yo male long term resident seen today for f/u. He has seen the surgeon and is awaiting a date to have colostomy reversal and cholecystectomy. He would like to return to his home as he has not lived there in 2-3 yrs due to colostomy. No other concerns. No nursing issues. No falls. His appetite is excellent. Sleeping well.  UC - s/p colectomy and colostomy. He has chronic loose stools and is taking questran  DM - BS stable on lantus and humalog SSI. No low BS reactions. A1c 6.4%  HTN - BP stable on amlodipine, coreg. He takes ASA daily  Fe deficiency anemia - stable on iron supplement  BPH - sx's stable on flomax and proscar  FTT - he receives nutritional supplement per facility protocol. Takes probiotic daily and MVI daily. Takes remeron. Weight is up 12 lbs since Dec 2016  Chronic LLE wound - followed by wound care team. He takes prostat  Hypothyroidism - stable on levothyroxine. TSH 2.47  GERD - stable on omeprazole. He takes zofran  CKD stage 4 - Cr stable at 2.4  Chronic pain/joint pain- stable on OxyIR    Past Medical History  Diagnosis Date  . Ulcerative colitis ([place])   . Diabetes mellitus without complication ([place])   .  Hypertension   . Iron deficiency anemia   . Hypothyroidism   . 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 Back surgery      multiple surgeries per family  . 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 Appendectomy    . Debridement of sacral decubitus ulcer  01-26-14    Patient Care Team: [name], MD as PCP - General (Internal Medicine)  Social History   Social History  . Marital Status: Unknown    Spouse Name: N/A  . Number of Children: N/A  . Years of Education: N/A   Occupational History  . Not on file.   Social History Main Topics  . Smoking status: Former Smoker -- 0.50 packs/day    Types: Cigarettes    Quit date: 07/24/1966  . Smokeless tobacco: Never Used  . Alcohol Use: No  . Drug Use: No     Comment: Patient stated he quit smoking 40 years ago. Exact   . Sexual Activity: Not on file   Other Topics Concern  . Not on file   Social History Narrative     reports that he quit smoking about 48 years ago. His smoking use included Cigarettes. He smoked 0.50 packs per day. He has never used smokeless tobacco. He reports that he does not drink alcohol or use illicit drugs.  Immunization History  Administered Date(s) Administered  . PPD  Test 07/24/2014, 08/10/2014    Allergies  Allergen Reactions  . Influenza Vaccines Anaphylaxis    ON MAR  . Other Other (See Comments)    Seasonal (sinus) allergies    Medications: Patient's Medications  New Prescriptions   No medications on file  Previous Medications   AMBULATORY NON FORMULARY MEDICATION    Med Pass: Give 120 ml by mouth daily.   AMLODIPINE (NORVASC) 10 MG TABLET    Take 10 mg by mouth daily.   ASPIRIN 81 MG TABLET    Take 81 mg by mouth daily.   BISACODYL (DULCOLAX) 10 MG SUPPOSITORY    If not relieved by milk of magnesium give 10 mg Bisacodyl suppository rectally X 1 dose in 24 hours as needed   CALCIUM ACETATE (PHOSLO) 667  MG CAPSULE    Take 667 mg by mouth daily.   CARVEDILOL (COREG) 12.5 MG TABLET    Take 12.5 mg by mouth 2 (two) times daily with a meal.   CHOLESTYRAMINE (QUESTRAN) 4 G PACKET    Take 1 packet (4 g total) by mouth daily.   FERROUS SULFATE 325 (65 FE) MG TABLET    Take 325 mg by mouth daily with breakfast.   FINASTERIDE (PROSCAR) 5 MG TABLET    Take 1 tablet (5 mg total) by mouth daily.   INSULIN GLARGINE (LANTUS) 100 UNIT/ML INJECTION    Inject 0.08 mLs (8 Units total) into the skin at bedtime.   INSULIN LISPRO (HUMALOG) 100 UNIT/ML INJECTION    Sliding scale  0-150=OU 151-200=2U 201-250=4U 251-300=6U 301-350=8U 351-400=10U >400=12U   LEVOTHYROXINE (SYNTHROID, LEVOTHROID) 100 MCG TABLET    Take 100 mcg by mouth daily before breakfast.   MAGNESIUM HYDROXIDE (MILK OF MAGNESIA PO)    Take by mouth. If no BM in 3 days, give 30 cc of milk of magnesium by mouth x 1 dose in 24 hour as needed   MIRTAZAPINE (REMERON) 15 MG TABLET    Take 15 mg by mouth at bedtime.   MULTIPLE VITAMINS-MINERALS (MULTIVITAMIN WITH MINERALS) TABLET    Take 1 tablet by mouth daily.   OMEPRAZOLE (PRILOSEC) 20 MG CAPSULE    Take 20 mg by mouth daily. For GERD   ONDANSETRON (ZOFRAN) 4 MG TABLET    Take 4 mg by mouth every 4 (four) hours as needed for nausea or vomiting.   OXYCODONE (OXY IR/ROXICODONE) 5 MG IMMEDIATE RELEASE TABLET    Take 1 tablet (5 mg total) by mouth every 6 (six) hours as needed for moderate pain.   PROBIOTIC PRODUCT (PROBIOTIC DAILY) CAPS    Take 1 capsule by mouth daily.   TAMSULOSIN (FLOMAX) 0.4 MG CAPS CAPSULE    Take 2 capsules (0.8 mg total) by mouth every other day.  Modified Medications   No medications on file  Discontinued Medications   AMINO ACIDS-PROTEIN HYDROLYS (FEEDING SUPPLEMENT, PRO-STAT SUGAR FREE 64,) LIQD    Take 30 mLs by mouth 2 (two) times daily.   MIRTAZAPINE (REMERON) 7.5 MG TABLET    Take 7.5 mg by mouth at bedtime.    Review of Systems  Musculoskeletal: Positive for arthralgias.    All other systems reviewed and are negative.   Filed Vitals:   03/22/15 1536  BP: 138/72  Pulse: 66  Temp: 98 F (36.7 C)  TempSrc: Oral  Resp: 20  Height: 6\' 2"  (1.88 m)  Weight: 187 lb (84.823 kg)   Body mass index is 24 kg/(m^2).  Physical Exam  Constitutional: He is oriented to  person, place, and time. He appears well-developed.  Frail appearing sitting on the edge of the bed in NAD  HENT:  Mouth/Throat: Oropharynx is clear and moist.  Eyes: No scleral icterus (on OS).  OD prosthesis. OS PERRL  Neck: Neck supple. Carotid bruit is not present. No thyromegaly present.  Cardiovascular: Normal rate, regular rhythm, normal heart sounds and intact distal pulses.  Exam reveals no gallop and no friction rub.   No murmur heard. no distal LE swelling. No calf TTP  Pulmonary/Chest: Effort normal and breath sounds normal. He has no wheezes. He has no rales. He exhibits no tenderness.  Abdominal: Soft. Bowel sounds are normal. He exhibits no distension, no abdominal bruit, no pulsatile midline mass and no mass. There is no tenderness. There is no rebound and no guarding.  RUQ intact Percutaneous cholecystostomy tube; midline colostomy intact. No purulent d/c. No bloody stool. Stump appears nml.   Musculoskeletal: He exhibits edema.  Small and large joint swelling noted  Lymphadenopathy:    He has no cervical adenopathy.  Neurological: He is alert and oriented to person, place, and time.  Skin: Skin is warm and dry. No rash noted.  Left maxilla sebaceous cyst grape sized, NT, no redness or d/c. Distal LLE dressing intact  Psychiatric: He has a normal mood and affect. His behavior is normal. Thought content normal.     Labs reviewed: Abstract on 03/19/2015  Component Date Value Ref Range Status  . Glucose 02/22/2015 131   Final  . BUN 02/22/2015 40* 4 - 21 mg/dL Final  . Creatinine 02/22/2015 2.4* 0.6 - 1.3 mg/dL Final  . Potassium 02/22/2015 4.2  3.4 - 5.3 mmol/L Final  .  Sodium 02/22/2015 139  137 - 147 mmol/L Final  . Alkaline Phosphatase 02/22/2015 126* 25 - 125 U/L Final  . ALT 02/22/2015 29  10 - 40 U/L Final  . AST 02/22/2015 27  14 - 40 U/L Final  . Bilirubin, Total 02/22/2015 0.4   Final  . Hemoglobin A1C 02/22/2015 6.5   Final  . Hemoglobin A1C 01/25/2015 7.0   Final  . TSH 01/25/2015 2.47  0.41 - 5.90 uIU/mL Final  . Hemoglobin A1C 01/25/2015 5.7   Final  . Hemoglobin 02/22/2015 13.1* 13.5 - 17.5 g/dL Final  . HCT 02/22/2015 40* 41 - 53 % Final  . Platelets 02/22/2015 333  150 - 399 K/L Final  . WBC 02/22/2015 10.9   Final  . Hemoglobin 11/09/2014 10.2* 13.5 - 17.5 g/dL Final  . HCT 11/09/2014 31* 41 - 53 % Final  . Platelets 11/09/2014 311  150 - 399 K/L Final  . WBC 11/09/2014 11.7   Final  . Glucose 11/09/2014 147   Final  . BUN 11/09/2014 43* 4 - 21 mg/dL Final  . Creatinine 11/09/2014 2.9* 0.6 - 1.3 mg/dL Final  . Potassium 11/09/2014 4.2  3.4 - 5.3 mmol/L Final  . Sodium 11/09/2014 138  137 - 147 mmol/L Final  . Alkaline Phosphatase 11/09/2014 90  25 - 125 U/L Final  . ALT 11/09/2014 15  10 - 40 U/L Final  . AST 11/09/2014 13* 14 - 40 U/L Final  . Bilirubin, Total 11/09/2014 0.3   Final  . TSH 07/28/2014 1.53  0.41 - 5.90 uIU/mL Final    Dg Colon W/cm - Wo/w Kub  03/08/2015  CLINICAL DATA:  70 year old male with ulcerative colitis post total colectomy. Evaluation for reanastomosis. Subsequent encounter. EXAM: BE WITH CONTRAST - WITHOUT AND WITH KUB CONTRAST:  Omnipaque 300 diluted with water. FLUOROSCOPY TIME:  Radiation Exposure Index (as provided by the fluoroscopic device): 100 d Gray/cm2 Fluoroscopy Time 1 minutes and 24 seconds. COMPARISON:  03/06/2014 CT. FINDINGS: Exam discussed with Dr. Grandville Silos (before and after procedure). Scout view reveals right colostomy tube in place. Degenerative changes lower lumbar spine. Vascular calcifications. Rectal tube placed without difficulty. Contrast was gently instilled per rectum without  balloon insufflation. Immediate return per rectum. Balloon partially inflated. Contrast was gently instilled with 2 images obtained and contrast immediately drained. Slight bloody discharge with a single blood clot noted (relayed to Dr. Grandville Silos). No extravasation of contrast noted. Patient observed and had no discomfort prior to leaving the radiology suite. IMPRESSION: Rectal stump without leak noted. Slight bloody discharge with single blood clot noted upon return of instilled fluid. Electronically Signed   By: Genia Del M.D.   On: 03/08/2015 11:02     Assessment/Plan   ICD-9-CM ICD-10-CM   1. FTT (failure to thrive) in adult - improved 783.7 R62.7   2. Essential hypertension 401.9 I10   3. DM type 2, controlled, with complication ([place]) A999333 E11.8   4. Wound of left lower extremity, sequela 906.1 S81.802S   5. Frequent loose stools 787.91 R19.7   6. 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 Hypothyroidism due to acquired atrophy of thyroid 244.8 E03.8    246.8 E03.4   8. BPH (benign prostatic hypertrophy) with urinary obstruction 600.01 N40.1    599.69    9. Acute cholecystitis s/p Percutaneous cholecystostomy tube 575.0 K81.0   10. Chronic pain syndrome 338.4 G89.4   11. Anemia, chronic disease 285.29 D63.8     F/u with surgery and IR as scheduled  Cont current meds as ordered  Wound care as ordered  Cont nutritional supplements as ordered  Colostomy care as indicated  Percutaneous cholecystostomy tube care as indicated  PT/OT as indicated  Will follow  Calaya Gildner S. Perlie Gold  [facility] and Adult Medicine [address] [place], [place] [address] [phone] Cell (Monday-Friday 8 AM - 5 PM) [phone] After 5 PM and follow prompts

## 2015-03-23 NOTE — Telephone Encounter (Signed)
Gabriel Lynch, I have reviewed outside records from [facility]. According to the reports, the patient had ulcerative colitis refractory to medical therapy. He underwent colonoscopy at that time with biopsies. Subsequently underwent surgery and was said to have total colectomy. I'm not sure that flexible sigmoidoscopy is going to be helpful. I did discuss this with Dr. Grandville Silos last week. Please fax copies the aforementioned reports Dr. Grandville Silos for his review. He may need to contact the surgeon to discuss further.

## 2015-03-23 NOTE — Telephone Encounter (Signed)
Records faxed to Dr. Grandville Silos.

## 2015-04-13 ENCOUNTER — Other Ambulatory Visit: Payer: Self-pay | Admitting: General Surgery

## 2015-04-15 LAB — BASIC METABOLIC PANEL
BUN: 36 mg/dL — AB (ref 4–21)
Creatinine: 2 mg/dL — AB (ref 0.6–1.3)
Glucose: 133 mg/dL
Potassium: 4.2 mmol/L (ref 3.4–5.3)
Sodium: 139 mmol/L (ref 137–147)

## 2015-04-21 ENCOUNTER — Ambulatory Visit (HOSPITAL_COMMUNITY)
Admission: RE | Admit: 2015-04-21 | Discharge: 2015-04-21 | Disposition: A | Discharge status: Home / Self Care | Payer: Medicare Other | Source: Ambulatory Visit | Attending: General Surgery | Admitting: General Surgery

## 2015-04-21 ENCOUNTER — Encounter (HOSPITAL_COMMUNITY)
Admission: RE | Disposition: A | Discharge status: Home / Self Care | Payer: Self-pay | Source: Ambulatory Visit | Attending: General Surgery

## 2015-04-21 ENCOUNTER — Encounter (HOSPITAL_COMMUNITY): Payer: Self-pay | Admitting: *Deleted

## 2015-04-21 DIAGNOSIS — K621 Rectal polyp: Secondary | ICD-10-CM | POA: Insufficient documentation

## 2015-04-21 DIAGNOSIS — Z932 Ileostomy status: Secondary | ICD-10-CM | POA: Insufficient documentation

## 2015-04-21 DIAGNOSIS — Z9049 Acquired absence of other specified parts of digestive tract: Secondary | ICD-10-CM | POA: Insufficient documentation

## 2015-04-21 DIAGNOSIS — Z7982 Long term (current) use of aspirin: Secondary | ICD-10-CM | POA: Diagnosis not present

## 2015-04-21 DIAGNOSIS — D509 Iron deficiency anemia, unspecified: Secondary | ICD-10-CM | POA: Insufficient documentation

## 2015-04-21 DIAGNOSIS — E119 Type 2 diabetes mellitus without complications: Secondary | ICD-10-CM | POA: Insufficient documentation

## 2015-04-21 DIAGNOSIS — K5190 Ulcerative colitis, unspecified, without complications: Secondary | ICD-10-CM | POA: Diagnosis not present

## 2015-04-21 DIAGNOSIS — Z79899 Other long term (current) drug therapy: Secondary | ICD-10-CM | POA: Insufficient documentation

## 2015-04-21 DIAGNOSIS — I10 Essential (primary) hypertension: Secondary | ICD-10-CM | POA: Insufficient documentation

## 2015-04-21 DIAGNOSIS — E039 Hypothyroidism, unspecified: Secondary | ICD-10-CM | POA: Insufficient documentation

## 2015-04-21 DIAGNOSIS — H5441 Blindness, right eye, normal vision left eye: Secondary | ICD-10-CM | POA: Diagnosis not present

## 2015-04-21 DIAGNOSIS — Z970 Presence of artificial eye: Secondary | ICD-10-CM | POA: Insufficient documentation

## 2015-04-21 HISTORY — PX: FLEXIBLE SIGMOIDOSCOPY: SHX5431

## 2015-04-21 LAB — GLUCOSE, CAPILLARY: GLUCOSE-CAPILLARY: 94 mg/dL (ref 65–99)

## 2015-04-21 SURGERY — SIGMOIDOSCOPY, FLEXIBLE
Anesthesia: Moderate Sedation

## 2015-04-21 MED ORDER — MIDAZOLAM HCL 5 MG/ML IJ SOLN
INTRAMUSCULAR | Status: AC
  Filled 2015-04-21: qty 1

## 2015-04-21 MED ORDER — SODIUM CHLORIDE 0.9 % IV SOLN
INTRAVENOUS | Status: DC
  Administered 2015-04-21: 500 mL via INTRAVENOUS

## 2015-04-21 MED ORDER — FENTANYL CITRATE (PF) 100 MCG/2ML IJ SOLN
INTRAMUSCULAR | Status: AC
  Filled 2015-04-21: qty 2

## 2015-04-21 NOTE — H&P (Signed)
History of Present Illness Gabriel Lynch; 123456 10:56 AM) The patient is a 70 year old male presenting to discuss diagnostic procedure results. Gabriel Lynch has a percutaneous cholecystostomy tube in place. It is been clamped for over a month and he is asymptomatic from that. He also has an ileostomy which is status post a subtotal colectomy in [facility] in Jan 16. He is very interested in having that reversed. He has been doing therapy at [facility] and feels he is much stronger. It appears his total abdominal colectomy was for refractory ulcerative colitis. He states that he has been diagnosed with ulcerative colitis since 2006. He was not currently on any medication for this during his flare that ultimately led to his colectomy. His last colonoscopy was also in January 2000 1616 and showed severe ulcerative colitis. Biopsies were negative for any dysplasia. His colectomy was also positive for ulcerative colitis involving the entire colon with no signs of malignancy. He is doing well with his ileostomy. He denies any rectal bleeding.  Past Medical History  Diagnosis Date  . Ulcerative colitis ([place])   . Diabetes mellitus without complication ([place])   . Hypertension   . Iron deficiency anemia   . Hypothyroidism   . 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 Back surgery      multiple surgeries per family  . 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 Appendectomy    . Debridement of sacral decubitus ulcer  01-26-14    Allergies Elbert Ewings, CMA; 04/13/2015 9:26 AM) Influenza Virus Vaccine *VACCINES* No Known Drug Allergies03/28/2017 (Marked as  Inactive)  Medication History Elbert Ewings, CMA; 04/13/2015 9:28 AM) Finasteride (1MG  Tablet, Oral) Active. Multiple Vitamin (Oral) Active. Mirtazapine (15MG  Tablet, Oral) Active. AmLODIPine Besylate (10MG  Tablet, Oral) Active. Aspirin (81MG  Tablet DR, Oral) Active. Coreg (12.5MG  Tablet, Oral) Active. Questran (4GM Packet, Oral) Active. Ferrous Sulfate (325 (65 Fe)MG Tablet, Oral) Active. HumaLOG (100UNIT/ML Solution, Subcutaneous) Active. Levothyroxine Sodium (100MCG Tablet, Oral) Active. Omeprazole (20MG  Tablet DR, Oral) Active. Tamsulosin HCl (0.4MG  Capsule, Oral) Active. Medications Reconciled  Allergies  Allergen Reactions  . Influenza Vaccines Anaphylaxis    ON MAR  . Other Other (See Comments)    Seasonal (sinus) allergies   Review of Systems - General ROS: negative for - chills, fatigue or fever Hematological and Lymphatic ROS: negative for - bleeding problems or blood clots Respiratory ROS: no cough, shortness of breath, or wheezing Cardiovascular ROS: no chest pain or dyspnea on exertion Gastrointestinal ROS: no abdominal pain, change in bowel habits, or black or bloody stools   Vitals Elbert Ewings CMA; 04/13/2015 9:28 AM) 04/13/2015 9:28 AM Weight: 190 lb Height: 74in Body Surface Area: 2.13 m Body Mass Index: 24.39 kg/m  Temp.: 98.67F  Pulse: 75 (Regular)  BP: 122/68 (Sitting, Left Arm, Standard)     Physical Exam Gabriel Lynch; 123456 10:57 AM) General Mental Status-Alert. General Appearance-Not in acute distress. Build & Nutrition-Well nourished. Posture-Normal posture. Gait-Normal.  Head and Neck Head-normocephalic, atraumatic with no lesions or palpable masses. Trachea-midline.  Chest and Lung Exam Chest and lung exam reveals -on auscultation, normal breath sounds, no adventitious sounds and normal vocal resonance.  Cardiovascular Cardiovascular examination reveals -normal heart sounds,  regular  rate and rhythm with no murmurs.  Abdomen Inspection Inspection of the abdomen reveals - No Hernias and Incision healing well without signs of infections or seroma(no obvious hernias). Palpation/Percussion Palpation and Percussion of the abdomen reveal - Soft, Non Tender, No Rigidity (guarding), No hepatosplenomegaly and No Palpable abdominal masses.  Neurologic Neurologic evaluation reveals -alert and oriented x 3 with no impairment of recent or remote memory, normal attention span and ability to concentrate, normal sensation and normal coordination.  Musculoskeletal Normal Exam - Bilateral-Upper Extremity Strength Normal and Lower Extremity Strength Normal.    Assessment & Plan Gabriel Lynch; 123456 10:57 AM) [name] ULCERATIVE COLITIS WITHOUT COMPLICATION ([id number]) Impression: 70 year old male with a 10 year history of ulcerative colitis who underwent total abdominal colectomy emergently due to refractory disease. This was done on January 20, 2014. There was no dysplasia noted. The disease ainvolved the entire colon. He is now here today to discuss reversal. We discussed his options which include continuing with the ostomy and doing yearly surveillance of his rectal stump versus possible ileorectal anastomosis if no disease is noted in the rectum or total proctectomy and J-pouch with diverting ileostomy. The patient is not interested in undergoing J-pouch surgery. He would be interested in possible ileorectal anastomosis. Given this, I have recommended evaluation of his rectal stump via endoscopy with biopsies. We will also evaluate for any malignancy. If there is no involvement of the rectal stump I think we can proceed with ileorectal anastomosis and he would need to go back on a maintenance dose medication for his ulcerative colitis. If there is significant ulcerative colitis within the rectal stump, I would recommend continuing the ileostomy and interval evaluation of the rectal  stump endoscopically vs completion proctoscopy depending on pt preference.

## 2015-04-21 NOTE — Op Note (Signed)
[facility] Patient Name: Gabriel Lynch Procedure Date: 04/21/2015 MRN: [id number] Attending MD: [name] , MD Date of Birth: 04-Sep-1945 CSN:  Age: 70 Admit Type: Outpatient Procedure:                Flexible Sigmoidoscopy Indications:              High risk colon cancer surveillance: Ulcerative                            colitis Providers:                [name], MD, Carolynn Comment, RN, Alfonso Patten, Technician Referring MD:              Medicines:                None Complications:            No immediate complications. Estimated Blood Loss:     Estimated blood loss was minimal. Procedure:                Pre-Anesthesia Assessment:                           - This assessment was completed prior to the                            administration of sedation.                           - Prior to the procedure, a History and Physical                            was performed, and patient medications, allergies                            and sensitivities were reviewed. The patient's                            tolerance of previous anesthesia was reviewed.                           - The risks and benefits of the procedure and the                            sedation options and risks were discussed with the                            patient. All questions were answered and informed                            consent was obtained.                           After obtaining informed consent, the scope was  passed under direct vision. The [id number]                            scope was introduced through the anus and advanced                            to the the rectosigmoid junction. The flexible                            sigmoidoscopy was accomplished with ease. The                            patient tolerated the procedure well. Scope In: Scope Out: Findings:      The perianal and digital  rectal examinations were normal. Pertinent       negatives include normal sphincter tone.      Multiple semi-pedunculated polyps were found in the rectum. The polyps       were 1 to 3 mm in size. These were biopsied with a cold forceps for       histology. Impression:               - Multiple 1 to 3 mm polyps in the rectum. Biopsied. Moderate Sedation:      N/A- Per Anesthesia Care Recommendation:           - Await pathology results. Procedure Code(s):        --- Professional ---                           [phone], 78, Sigmoidoscopy, flexible; with biopsy,                            single or multiple Diagnosis Code(s):        --- Professional ---                           K51.90, Ulcerative colitis, unspecified, without                            complications                           K62.1, Rectal polyp CPT copyright 2016 American Medical Association. All rights reserved. The codes documented in this report are preliminary and upon coder review may  be revised to meet current compliance requirements. [name], MD [name], MD 0000000 8:45:38 AM This report has been signed electronically. Number of Addenda: 0

## 2015-04-21 NOTE — Progress Notes (Signed)
Pt procedure was unsedated. Pt tolerated procedure well. No complaints of pain or discomfort.

## 2015-04-21 NOTE — Discharge Instructions (Addendum)
Post Colonoscopy Instructions ° °1. DIET: Follow a light bland diet the first 24 hours after arrival home, such as soup, liquids, crackers, etc.  Be sure to include lots of fluids daily.  Avoid fast food or heavy meals as your are more likely to get nauseated.   °2. You may have some mild rectal bleeding for the first few days after the procedure.  This should get less and less with time.  Resume any blood thinners 2 days after your procedure unless directed otherwise by your physician. °3. Take your usually prescribed home medications unless otherwise directed. °a. If you have any pain, it is helpful to get up and walk around, as it is usually from excess gas. °b. If this is not helpful, you can take an over-the-counter pain medication.  Choose one of the following that works best for you: °i. Naproxen (Aleve, etc)  Two 220mg tabs twice a day °ii. Ibuprofen (Advil, etc) Three 200mg tabs four times a day (every meal & bedtime) °iii. If you still have pain after using one of these, please call the office °4. It is normal to not have a bowel movement for 2-3 days after colonoscopy.   ° °5. ACTIVITIES as tolerated:   °6. You may resume regular (light) daily activities beginning the next day--such as daily self-care, walking, climbing stairs--gradually increasing activities as tolerated.  ° ° °WHEN TO CALL US [phone]: °1. Fever over 101.5 F (38.5 C)  °2. Severe abdominal or chest pain  °3. Large amount of rectal bleeding, passing multiple blood clots  °4. Dizziness or shortness of breath °5. Increasing nausea or vomiting ° ° The clinic staff is available to answer your questions during regular business hours (8:30am-5pm).  Please don’t hesitate to call and ask to speak to one of our nurses for clinical concerns.  ° If you have a medical emergency, go to the nearest emergency room or call 911. ° A surgeon from [facility] is always on call at the hospitals ° ° °[facility] °[address], [address], [place], [place]  [address] ? °MAIN: [phone] ? TOLL FREE: [phone] ?  °FAX [phone] °[email or web address] ° ° °

## 2015-04-22 ENCOUNTER — Encounter (HOSPITAL_COMMUNITY): Payer: Self-pay | Admitting: General Surgery

## 2015-04-23 ENCOUNTER — Encounter: Payer: Self-pay | Admitting: Surgery

## 2015-04-29 ENCOUNTER — Non-Acute Institutional Stay (SKILLED_NURSING_FACILITY): Payer: Medicare Other | Admitting: Adult Health

## 2015-04-29 ENCOUNTER — Encounter: Payer: Self-pay | Admitting: Adult Health

## 2015-04-29 DIAGNOSIS — N183 Chronic kidney disease, stage 3 unspecified: Secondary | ICD-10-CM

## 2015-04-29 DIAGNOSIS — Z933 Colostomy status: Secondary | ICD-10-CM

## 2015-04-29 DIAGNOSIS — Z8619 Personal history of other infectious and parasitic diseases: Secondary | ICD-10-CM | POA: Diagnosis not present

## 2015-04-29 DIAGNOSIS — N401 Enlarged prostate with lower urinary tract symptoms: Secondary | ICD-10-CM

## 2015-04-29 DIAGNOSIS — I10 Essential (primary) hypertension: Secondary | ICD-10-CM | POA: Diagnosis not present

## 2015-04-29 DIAGNOSIS — E034 Atrophy of thyroid (acquired): Secondary | ICD-10-CM

## 2015-04-29 DIAGNOSIS — D638 Anemia in other chronic diseases classified elsewhere: Secondary | ICD-10-CM | POA: Diagnosis not present

## 2015-04-29 DIAGNOSIS — E118 Type 2 diabetes mellitus with unspecified complications: Secondary | ICD-10-CM

## 2015-04-29 DIAGNOSIS — K810 Acute cholecystitis: Secondary | ICD-10-CM

## 2015-04-29 DIAGNOSIS — E038 Other specified hypothyroidism: Secondary | ICD-10-CM

## 2015-04-29 DIAGNOSIS — Z9049 Acquired absence of other specified parts of digestive tract: Secondary | ICD-10-CM

## 2015-04-29 DIAGNOSIS — N138 Other obstructive and reflux uropathy: Secondary | ICD-10-CM

## 2015-04-29 LAB — IRON AND TIBC
Iron, Total/Total Iron Binding CAP: 70
TIBC: 206
UIBC: 136
VIT D 25 HYDROXY: 22

## 2015-04-29 NOTE — Progress Notes (Signed)
Patient ID: Gabriel Lynch, male   DOB: 1945-09-14, 70 y.o.   MRN: [id number]   Facility: [facility]       Allergies  Allergen Reactions  . Influenza Vaccines Anaphylaxis    ON MAR  . Other Other (See Comments)    Seasonal (sinus) allergies    Chief Complaint  Patient presents with  . Medical Management of Chronic Issues    Follow up    HPI:  He is a resident of this facility being seen for the management of his chronic illnesses. Next week he is due to see surgeon regarding reversing his colostomy and removing his chole drain. He is hopeful to return back home in the near future. He is not voicing any complaints or concerns there are no nursing concerns at this time.   Past Medical History  Diagnosis Date  . Ulcerative colitis ([place])   . Diabetes mellitus without complication ([place])   . Hypertension   . Iron deficiency anemia   . Hypothyroidism   . 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 Back surgery      multiple surgeries per family  . 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 Appendectomy    . Debridement of sacral decubitus ulcer  01-26-14  . Flexible sigmoidoscopy N/A 04/21/2015    Procedure: FLEXIBLE SIGMOIDOSCOPY WITH BIOPSY ;  Surgeon: [name], MD;  Location: [facility];  Service: Endoscopy;  Laterality: N/A;  possible unsedated    VITAL SIGNS BP 138/72 mmHg  Pulse 66  Temp(Src) 98 F (36.7 C) (Oral)  Resp 20  Ht 6\' 2"  (1.88 m)  Wt 188 lb 4 oz (85.39 kg)  BMI 24.16 kg/m2  Patient's Medications  New Prescriptions   No medications on file  Previous Medications   AMBULATORY NON FORMULARY MEDICATION    Med Pass: Give 120 ml by mouth daily.   AMLODIPINE (NORVASC) 10 MG TABLET    Take 10 mg by mouth daily.   ASPIRIN 81 MG TABLET    Take 81 mg by mouth daily.   BISACODYL (DULCOLAX) 10 MG SUPPOSITORY    If not relieved by milk of magnesium give 10 mg Bisacodyl suppository  rectally X 1 dose in 24 hours as needed   CALCIUM ACETATE (PHOSLO) 667 MG CAPSULE    Take 667 mg by mouth daily.   CARVEDILOL (COREG) 12.5 MG TABLET    Take 12.5 mg by mouth 2 (two) times daily with a meal.   CHOLESTYRAMINE (QUESTRAN) 4 G PACKET    Take 1 packet (4 g total) by mouth daily.   CLOBETASOL CREAM (TEMOVATE) 0.05 %    Apply 1 application topically daily.   FERROUS SULFATE 325 (65 FE) MG TABLET    Take 325 mg by mouth daily with breakfast.   FINASTERIDE (PROSCAR) 5 MG TABLET    Take 1 tablet (5 mg total) by mouth daily.   INSULIN GLARGINE (LANTUS) 100 UNIT/ML INJECTION    Inject 0.08 mLs (8 Units total) into the skin at bedtime.   INSULIN LISPRO (HUMALOG) 100 UNIT/ML INJECTION    Sliding scale  0-150=OU 151-200=2U 201-250=4U 251-300=6U 301-350=8U 351-400=10U >400=12U   LEVOTHYROXINE (SYNTHROID, LEVOTHROID) 100 MCG TABLET    Take 100 mcg by mouth daily before breakfast.   LOPERAMIDE HCL (IMODIUM A-D PO)    Take 4 mg by mouth as needed (Not exceed 8mg /24hours).   MAGNESIUM HYDROXIDE (  MILK OF MAGNESIA PO)    Take by mouth. If no BM in 3 days, give 30 cc of milk of magnesium by mouth x 1 dose in 24 hour as needed   MIRTAZAPINE (REMERON) 15 MG TABLET    Take 15 mg by mouth at bedtime.   MULTIPLE VITAMINS-MINERALS (MULTIVITAMIN WITH MINERALS) TABLET    Take 1 tablet by mouth daily.   OMEPRAZOLE (PRILOSEC) 20 MG CAPSULE    Take 20 mg by mouth daily. For GERD   ONDANSETRON (ZOFRAN) 4 MG TABLET    Take 4 mg by mouth every 4 (four) hours as needed for nausea or vomiting.   OXYCODONE (OXY IR/ROXICODONE) 5 MG IMMEDIATE RELEASE TABLET    Take 1 tablet (5 mg total) by mouth every 6 (six) hours as needed for moderate pain.   PROBIOTIC PRODUCT (PROBIOTIC DAILY) CAPS    Take 1 capsule by mouth daily.   TAMSULOSIN (FLOMAX) 0.4 MG CAPS CAPSULE    Take 2 capsules (0.8 mg total) by mouth every other day.  Modified Medications   No medications on file  Discontinued Medications   No medications on file      SIGNIFICANT DIAGNOSTIC EXAMS   LABS REVIEWED:   01-25-15: tsh 2.49 02-22-15: hgb a1c 6.5 04-05-15: wbc 9.2; hb 11.9; hct 37.1; mcv 101.9; plt 376; glucose 124; bun 40; creat 2.44; k+ 5.1; na++135; iron 100; tibc 158; vit D 25; ferritin 526  04-08-15: glucose 112; bun 43; creat 2.34; k+ 5.8; na++138; mag 1.9; ferritin 400; iron 70; tibc 136; vit D 22 04-16-15; glucose 133; bun 36; creat 2.04; k+ 4.2; na++139    Review of Systems  Constitutional: Negative for malaise/fatigue.  Respiratory: Negative for cough and shortness of breath.   Cardiovascular: Negative for chest pain, palpitations and leg swelling.  Gastrointestinal: Negative for heartburn, abdominal pain and constipation.  Musculoskeletal: Negative for myalgias, back pain and joint pain.  Skin: Negative.   Neurological: Negative for dizziness.  Psychiatric/Behavioral: The patient is not nervous/anxious.     Physical Exam  Constitutional: He is oriented to person, place, and time. He appears well-developed and well-nourished. No distress.  Eyes: Conjunctivae are normal.  Neck: Neck supple. No JVD present. No thyromegaly present.  Cardiovascular: Normal rate, regular rhythm and intact distal pulses.   Respiratory: Effort normal and breath sounds normal. No respiratory distress. He has no wheezes.  GI: Soft. Bowel sounds are normal. He exhibits no distension. There is no tenderness.  Colostomy Chole drain  Musculoskeletal: He exhibits no edema.  Able to move all extremities   Lymphadenopathy:    He has no cervical adenopathy.  Neurological: He is alert and oriented to person, place, and time.  Skin: Skin is warm and dry. He is not diaphoretic.  Psychiatric: He has a normal mood and affect.      ASSESSMENT/ PLAN:  1. Acute cholecystitis: has chole drain;  Is awaiting appointment for removal of drain and reversal of colostomy  2. Hypertension: will continue norvasc 10 mg daily coreg 12.5 mg twice daily asa 81 mg daily    3. BPH: will continue flomax and proscar   4. Anemia: will continue iron daily   5. Hypothyroidism: will continue synthroid 100 mcg daily   6. Diabetes: will continue lantus 8 units daily with humalog ssi  7. Jerrye Bushy: will continue prilosec 20 mg daily   8. CKD stage III; will continue phoslo 667 mg daily   9. History of c-diff: does take questran 4 gm daily  for stool bulking     Ok Edwards NP [facility]  Contact [phone] Monday through Friday 8am- 5pm  After hours call [phone]

## 2015-05-04 ENCOUNTER — Encounter: Payer: Medicare Other | Admitting: Surgery

## 2015-05-18 ENCOUNTER — Other Ambulatory Visit: Payer: Self-pay | Admitting: General Surgery

## 2015-05-21 ENCOUNTER — Non-Acute Institutional Stay (SKILLED_NURSING_FACILITY): Payer: Medicare Other | Admitting: Adult Health

## 2015-05-21 ENCOUNTER — Encounter: Payer: Self-pay | Admitting: Adult Health

## 2015-05-21 DIAGNOSIS — G894 Chronic pain syndrome: Secondary | ICD-10-CM | POA: Diagnosis not present

## 2015-05-21 DIAGNOSIS — E038 Other specified hypothyroidism: Secondary | ICD-10-CM | POA: Diagnosis not present

## 2015-05-21 DIAGNOSIS — N183 Chronic kidney disease, stage 3 unspecified: Secondary | ICD-10-CM

## 2015-05-21 DIAGNOSIS — Z9049 Acquired absence of other specified parts of digestive tract: Secondary | ICD-10-CM | POA: Diagnosis not present

## 2015-05-21 DIAGNOSIS — Z933 Colostomy status: Secondary | ICD-10-CM | POA: Diagnosis not present

## 2015-05-21 DIAGNOSIS — E034 Atrophy of thyroid (acquired): Secondary | ICD-10-CM | POA: Diagnosis not present

## 2015-05-21 NOTE — Progress Notes (Signed)
Patient ID: Gabriel Lynch, male   DOB: 1945/09/26, 70 y.o.   MRN: [id number]  Facility: [facility]     CODE STATUS: Full Code  Allergies  Allergen Reactions  . Influenza Vaccines Anaphylaxis    ON MAR  . Other Other (See Comments)    Seasonal (sinus) allergies    Chief Complaint  Patient presents with  . Discharge Note    Discharge from [facility]    HPI:  He is being discharged to home with home health for rn. He will not need dme. He will need colostomy supplies; his prescriptions written and will need to follow up with his medical provider. He will be having his colostomy taken down later in the summer. He is independent with his adl's at this time.    Past Medical History  Diagnosis Date  . Ulcerative colitis ([place])   . Diabetes mellitus without complication ([place])   . Hypertension   . Iron deficiency anemia   . Hypothyroidism   . 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 Back surgery      multiple surgeries per family  . 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 Appendectomy    . Debridement of sacral decubitus ulcer  01-26-14  . Flexible sigmoidoscopy N/A 04/21/2015    Procedure: FLEXIBLE SIGMOIDOSCOPY WITH BIOPSY ;  Surgeon: [name], MD;  Location: [facility];  Service: Endoscopy;  Laterality: N/A;  possible unsedated    Social History   Social History  . Marital Status: Divorced    Spouse Name: N/A  . Number of Children: N/A  . Years of Education: N/A   Occupational History  . Not on file.   Social History Main Topics  . Smoking status: Former Smoker -- 0.50 packs/day    Types: Cigarettes    Quit date: 07/24/1966  . Smokeless tobacco: Never Used  . Alcohol Use: No  . Drug Use: No     Comment: Patient stated he quit smoking 40 years ago. Exact   . Sexual Activity: Not on file   Other Topics Concern  . Not on file   Social History Narrative   History reviewed.  No pertinent family history.  VITAL SIGNS BP 138/72 mmHg  Pulse 78  Temp(Src) 98 F (36.7 C) (Oral)  Resp 20  Ht 6\' 2"  (1.88 m)  Wt 191 lb 8 oz (86.864 kg)  BMI 24.58 kg/m2  Patient's Medications  New Prescriptions   No medications on file  Previous Medications   AMBULATORY NON FORMULARY MEDICATION    Med Pass: Give 120 ml by mouth daily.   AMLODIPINE (NORVASC) 10 MG TABLET    Take 10 mg by mouth daily.   ASPIRIN 81 MG TABLET    Take 81 mg by mouth daily.   BISACODYL (DULCOLAX) 10 MG SUPPOSITORY    If not relieved by milk of magnesium give 10 mg Bisacodyl suppository rectally X 1 dose in 24 hours as needed   CALCIUM ACETATE (PHOSLO) 667 MG CAPSULE    Take 667 mg by mouth daily.   CARVEDILOL (COREG) 12.5 MG TABLET    Take 12.5 mg by mouth 2 (two) times daily with a meal.   CHOLESTYRAMINE (QUESTRAN) 4 G PACKET    Take 1 packet (4 g total) by mouth daily.   CLOBETASOL CREAM (TEMOVATE) 0.05 %    Apply 1 application topically daily.   FERROUS  SULFATE 325 (65 FE) MG TABLET    Take 325 mg by mouth daily with breakfast.   FINASTERIDE (PROSCAR) 5 MG TABLET    Take 1 tablet (5 mg total) by mouth daily.   INSULIN GLARGINE (LANTUS) 100 UNIT/ML INJECTION    Inject 0.08 mLs (8 Units total) into the skin at bedtime.   INSULIN LISPRO (HUMALOG) 100 UNIT/ML INJECTION    Sliding scale  0-150=OU 151-200=2U 201-250=4U 251-300=6U 301-350=8U 351-400=10U >400=12U   LEVOTHYROXINE (SYNTHROID, LEVOTHROID) 100 MCG TABLET    Take 100 mcg by mouth daily before breakfast.   LOPERAMIDE HCL (IMODIUM A-D PO)    Take 4 mg by mouth as needed (Not exceed 8mg /24hours).   MAGNESIUM HYDROXIDE (MILK OF MAGNESIA PO)    Take by mouth. If no BM in 3 days, give 30 cc of milk of magnesium by mouth x 1 dose in 24 hour as needed   MIRTAZAPINE (REMERON) 15 MG TABLET    Take 15 mg by mouth at bedtime.   MULTIPLE VITAMINS-MINERALS (MULTIVITAMIN WITH MINERALS) TABLET    Take 1 tablet by mouth daily.   OMEPRAZOLE (PRILOSEC) 20 MG  CAPSULE    Take 20 mg by mouth daily. For GERD   ONDANSETRON (ZOFRAN) 4 MG TABLET    Take 4 mg by mouth every 4 (four) hours as needed for nausea or vomiting.   OXYCODONE (OXY IR/ROXICODONE) 5 MG IMMEDIATE RELEASE TABLET    Take 1 tablet (5 mg total) by mouth every 6 (six) hours as needed for moderate pain.   PROBIOTIC PRODUCT (PROBIOTIC DAILY) CAPS    Take 1 capsule by mouth daily.   TAMSULOSIN (FLOMAX) 0.4 MG CAPS CAPSULE    Take 2 capsules (0.8 mg total) by mouth every other day.  Modified Medications   No medications on file  Discontinued Medications   No medications on file     SIGNIFICANT DIAGNOSTIC EXAMS  01-25-15: tsh 2.49 02-22-15: hgb a1c 6.5 04-05-15: wbc 9.2; hb 11.9; hct 37.1; mcv 101.9; plt 376; glucose 124; bun 40; creat 2.44; k+ 5.1; na++135; iron 100; tibc 158; vit D 25; ferritin 526  04-08-15: glucose 112; bun 43; creat 2.34; k+ 5.8; na++138; mag 1.9; ferritin 400; iron 70; tibc 136; vit D 22 04-16-15; glucose 133; bun 36; creat 2.04; k+ 4.2; na++139    Review of Systems  Constitutional: Negative for malaise/fatigue.  Respiratory: Negative for cough and shortness of breath.   Cardiovascular: Negative for chest pain, palpitations and leg swelling.  Gastrointestinal: Negative for heartburn, abdominal pain and constipation.  Musculoskeletal: Negative for myalgias, back pain and joint pain.  Skin: Negative.   Neurological: Negative for dizziness.  Psychiatric/Behavioral: The patient is not nervous/anxious.     Physical Exam  Constitutional: He is oriented to person, place, and time. He appears well-developed and well-nourished. No distress.  Eyes: Conjunctivae are normal.  Neck: Neck supple. No JVD present. No thyromegaly present.  Cardiovascular: Normal rate, regular rhythm and intact distal pulses.   Respiratory: Effort normal and breath sounds normal. No respiratory distress. He has no wheezes.  GI: Soft. Bowel sounds are normal. He exhibits no distension. There is no  tenderness.  Colostomy Musculoskeletal: He exhibits no edema.  Able to move all extremities   Lymphadenopathy:    He has no cervical adenopathy.  Neurological: He is alert and oriented to person, place, and time.  Skin: Skin is warm and dry. He is not diaphoretic.  Psychiatric: He has a normal mood and affect.  ASSESSMENT/ PLAN:   Patient is being discharged with the following home health services:  RN to evaluate and treat as indicated for medication management   Patient is being discharged with the following durable medical equipment:   Does not need.   Patient has been advised to f/u with their PCP in 1-2 weeks to bring them up to date on their rehab stay.  Social services at facility is responsible for arranging this appointment.  Pt was provided with a 30 day supply of prescriptions for medications and refills must be obtained from their PCP.  For controlled substances, a more limited supply may be provided adequate until PCP appointment only. #15 oxycodone 5 mg tabs.    Time spent with patient 40   minutes >50% time spent counseling; reviewing medical record; tests; labs; and developing future plan of care    Ok Edwards NP [facility] Adult Medicine  Contact [phone] Monday through Friday 8am- 5pm  After hours call [phone]

## 2015-06-11 ENCOUNTER — Encounter (HOSPITAL_COMMUNITY): Payer: Self-pay

## 2015-06-11 ENCOUNTER — Other Ambulatory Visit: Payer: Self-pay

## 2015-06-11 ENCOUNTER — Encounter (HOSPITAL_COMMUNITY)
Admission: RE | Admit: 2015-06-11 | Discharge: 2015-06-11 | Disposition: A | Discharge status: Home / Self Care | Payer: Medicare Other | Source: Ambulatory Visit | Attending: General Surgery | Admitting: General Surgery

## 2015-06-11 DIAGNOSIS — Z01818 Encounter for other preprocedural examination: Secondary | ICD-10-CM | POA: Diagnosis present

## 2015-06-11 DIAGNOSIS — Z01812 Encounter for preprocedural laboratory examination: Secondary | ICD-10-CM | POA: Insufficient documentation

## 2015-06-11 DIAGNOSIS — E039 Hypothyroidism, unspecified: Secondary | ICD-10-CM | POA: Insufficient documentation

## 2015-06-11 DIAGNOSIS — K819 Cholecystitis, unspecified: Secondary | ICD-10-CM | POA: Diagnosis not present

## 2015-06-11 DIAGNOSIS — Z87891 Personal history of nicotine dependence: Secondary | ICD-10-CM | POA: Insufficient documentation

## 2015-06-11 DIAGNOSIS — N184 Chronic kidney disease, stage 4 (severe): Secondary | ICD-10-CM | POA: Diagnosis not present

## 2015-06-11 DIAGNOSIS — E1122 Type 2 diabetes mellitus with diabetic chronic kidney disease: Secondary | ICD-10-CM | POA: Insufficient documentation

## 2015-06-11 DIAGNOSIS — I129 Hypertensive chronic kidney disease with stage 1 through stage 4 chronic kidney disease, or unspecified chronic kidney disease: Secondary | ICD-10-CM | POA: Insufficient documentation

## 2015-06-11 DIAGNOSIS — D509 Iron deficiency anemia, unspecified: Secondary | ICD-10-CM | POA: Diagnosis not present

## 2015-06-11 DIAGNOSIS — K5190 Ulcerative colitis, unspecified, without complications: Secondary | ICD-10-CM | POA: Insufficient documentation

## 2015-06-11 DIAGNOSIS — Z79899 Other long term (current) drug therapy: Secondary | ICD-10-CM | POA: Insufficient documentation

## 2015-06-11 HISTORY — DX: Toxic effect of venom of brown recluse spider, accidental (unintentional), initial encounter: T63.331A

## 2015-06-11 HISTORY — DX: Unspecified osteoarthritis, unspecified site: M19.90

## 2015-06-11 HISTORY — DX: Chronic kidney disease, unspecified: N18.9

## 2015-06-11 HISTORY — DX: Other seasonal allergic rhinitis: J30.2

## 2015-06-11 HISTORY — DX: Unspecified injury of head, initial encounter: S09.90XA

## 2015-06-11 HISTORY — DX: Family history of other specified conditions: Z84.89

## 2015-06-11 LAB — BASIC METABOLIC PANEL
Anion gap: 10 (ref 5–15)
BUN: 46 mg/dL — AB (ref 6–20)
CHLORIDE: 108 mmol/L (ref 101–111)
CO2: 19 mmol/L — ABNORMAL LOW (ref 22–32)
CREATININE: 2.86 mg/dL — AB (ref 0.61–1.24)
Calcium: 10.3 mg/dL (ref 8.9–10.3)
GFR calc Af Amer: 24 mL/min — ABNORMAL LOW (ref >60)
GFR, EST NON AFRICAN AMERICAN: 21 mL/min — AB (ref >60)
Glucose, Bld: 162 mg/dL — ABNORMAL HIGH (ref 65–99)
Potassium: 5.8 mmol/L — ABNORMAL HIGH (ref 3.5–5.1)
SODIUM: 137 mmol/L (ref 135–145)

## 2015-06-11 LAB — GLUCOSE, CAPILLARY: GLUCOSE-CAPILLARY: 146 mg/dL — AB (ref 65–99)

## 2015-06-11 LAB — SURGICAL PCR SCREEN
MRSA, PCR: POSITIVE — AB
Staphylococcus aureus: POSITIVE — AB

## 2015-06-11 LAB — CBC
HCT: 37 % — ABNORMAL LOW (ref 39.0–52.0)
Hemoglobin: 11.6 g/dL — ABNORMAL LOW (ref 13.0–17.0)
MCH: 32.1 pg (ref 26.0–34.0)
MCHC: 31.4 g/dL (ref 30.0–36.0)
MCV: 102.5 fL — AB (ref 78.0–100.0)
PLATELETS: 501 10*3/uL — AB (ref 150–400)
RBC: 3.61 MIL/uL — ABNORMAL LOW (ref 4.22–5.81)
RDW: 12.5 % (ref 11.5–15.5)
WBC: 14.2 10*3/uL — AB (ref 4.0–10.5)

## 2015-06-11 NOTE — Progress Notes (Signed)
PCP - Dewayne Hatch, [facility] Cardiologist - denies  Chest x-ray - denies EKG - 06/11/15  Stress Test - denies ECHO - >10years ago at [facility] in [place], [place] Cardiac Cath -  Denies  Requested information from PCP about sleep apena, patient was diagnosed with sleep apena but does not wear a CPAP,  Patient scored a 5 on the sleep apena score.  Sent information over to the PCP  Patient has a left leg stent that [facility] to increase blood flow to that leg after a spider bite. Patient checks blood sugars 1-2 X a day with a fasting blood glucose of 90-105   Patient denies shortness of breath and chest pain at PAT appointment

## 2015-06-11 NOTE — Pre-Procedure Instructions (Signed)
Gabriel Lynch  06/11/2015    Your procedure is scheduled on Thursday, June 17, 2015 at 7:30 AM.   Report to [facility] Entrance "A" Admitting Office at 5:30 AM.   Call this number if you have problems the morning of surgery: [phone]   Any questions prior to day of surgery, please call [phone] between 8 & 4 PM.   Remember:  Do not eat food or drink liquids after midnight Wednesday, 06/16/15.  Take these medicines the morning of surgery with A SIP OF WATER: Amlodipine (Norvasc), Carvedilol (Coreg), Tamsulosin (Flomax) - if scheduled to take that day, Oxycodone - if needed, Zofran - if needed  Do not use any Aspirin products or NSAIDS (Ibuprofen, Aleve, etc.) prior to surgery as of today.   Do not wear jewelry.  Do not wear lotions, powders, or .  You may wear deodorant.  Men may shave face and neck.  Do not bring valuables to the hospital.  [facility] is not responsible for any belongings or valuables.  Contacts, dentures or bridgework may not be worn into surgery.  Leave your suitcase in the car.  After surgery it may be brought to your room.  For patients admitted to the hospital, discharge time will be determined by your treatment team.  Special instructions:  [facility] - Preparing for Surgery  Before surgery, you can play an important role.  Because skin is not sterile, your skin needs to be as free of germs as possible.  You can reduce the number of germs on you skin by washing with CHG (chlorahexidine gluconate) soap before surgery.  CHG is an antiseptic cleaner which kills germs and bonds with the skin to continue killing germs even after washing.  Please DO NOT use if you have an allergy to CHG or antibacterial soaps.  If your skin becomes reddened/irritated stop using the CHG and inform your nurse when you arrive at Short Stay.  Do not shave (including legs and underarms) for at least 48 hours prior to the first CHG shower.  You may shave your  face.  Please follow these instructions carefully:   1.  Shower with CHG Soap the night before surgery and the                                morning of Surgery.  2.  If you choose to wash your hair, wash your hair first as usual with your       normal shampoo.  3.  After you shampoo, rinse your hair and body thoroughly to remove the                      Shampoo.  4.  Use CHG as you would any other liquid soap.  You can apply chg directly       to the skin and wash gently with scrungie or a clean washcloth.  5.  Apply the CHG Soap to your body ONLY FROM THE NECK DOWN.        Do not use on open wounds or open sores.  Avoid contact with your eyes, ears, mouth and genitals (private parts).  Wash genitals (private parts) with your normal soap.  6.  Wash thoroughly, paying special attention to the area where your surgery        will be performed.  7.  Thoroughly rinse your body with warm water from  the neck down.  8.  DO NOT shower/wash with your normal soap after using and rinsing off       the CHG Soap.  9.  Pat yourself dry with a clean towel.            10.  Wear clean pajamas.            11.  Place clean sheets on your bed the night of your first shower and do not        sleep with pets.  Day of Surgery  Do not apply any lotions the morning of surgery.  Please wear clean clothes to the hospital.   Please read over the following fact sheets that you were given. Pain Booklet, Coughing and Deep Breathing, MRSA Information and Surgical Site Infection Prevention

## 2015-06-11 NOTE — Progress Notes (Signed)
Patient was PCR positive.  Called in prescription in at [employer] [employer] [facility] [place] and [place].  Contacted patient about prescription.  Verified that patient understood what the prescription was for and hor to use it

## 2015-06-12 LAB — HEMOGLOBIN A1C
Hgb A1c MFr Bld: 7.2 % — ABNORMAL HIGH (ref 4.8–5.6)
Mean Plasma Glucose: 160 mg/dL

## 2015-06-15 ENCOUNTER — Encounter (HOSPITAL_COMMUNITY): Payer: Self-pay

## 2015-06-15 NOTE — Progress Notes (Signed)
Anesthesia Chart Review:  Pt is a 70 year old male scheduled for laparoscopic cholecystectomy, possible ileostomy reversal on 06/17/2015 with Dr. Grandville Silos.   Nephrologist is Dr. Elmarie Shiley. PCP is Dr. Gildardo Cranker.   PMH includes:  HTN, DM, CKD (stage 4), ulcerative colitis, iron deficiency anemia, hypothyroidism. Former smoker. BMI 26  Medications include: amlodipine, carvedilol, cholestyramine, iron, levothyroxine  Preoperative labs reviewed.   - HgbA1c 7.2, glucose 162.  - Cr 2.86, BUN 46. Prior results 04/12/15 show Cr was 2.43 and BUN was 39  EKG 06/11/15: Normal sinus rhythm with sinus arrhythmia  Notes from Dr. Posey Pronto 04/12/15 (scanned in media tab) indicate he is aware of possibility of surgery. Dr. Posey Pronto notes pt "would need admission to the hospital for IV fluids/monitoring preoperatively given the chances of acute on chronic renal failure". I spoke with Dr. Grandville Silos who will reach out to Dr. Posey Pronto for guidance on managing pt's renal function perioperatively. Admission date now 06/16/15 for surgery scheduled 06/17/15.   If no changes, I anticipate pt can proceed with surgery as scheduled.   Willeen Cass, FNP-BC [facility]/Anesthesiology Phone: [phone] 06/15/2015 1:44 PM

## 2015-06-16 ENCOUNTER — Inpatient Hospital Stay (HOSPITAL_COMMUNITY)
Admission: RE | Admit: 2015-06-16 | Discharge: 2015-06-28 | DRG: 418 | Disposition: A | Discharge status: Home / Self Care | Payer: Medicare Other | Source: Ambulatory Visit | Attending: General Surgery | Admitting: General Surgery

## 2015-06-16 ENCOUNTER — Encounter (HOSPITAL_COMMUNITY): Payer: Self-pay | Admitting: General Practice

## 2015-06-16 DIAGNOSIS — Z794 Long term (current) use of insulin: Secondary | ICD-10-CM | POA: Diagnosis not present

## 2015-06-16 DIAGNOSIS — K8010 Calculus of gallbladder with chronic cholecystitis without obstruction: Secondary | ICD-10-CM | POA: Diagnosis present

## 2015-06-16 DIAGNOSIS — K5190 Ulcerative colitis, unspecified, without complications: Secondary | ICD-10-CM | POA: Diagnosis present

## 2015-06-16 DIAGNOSIS — K811 Chronic cholecystitis: Secondary | ICD-10-CM | POA: Diagnosis present

## 2015-06-16 DIAGNOSIS — M549 Dorsalgia, unspecified: Secondary | ICD-10-CM | POA: Diagnosis not present

## 2015-06-16 DIAGNOSIS — K660 Peritoneal adhesions (postprocedural) (postinfection): Secondary | ICD-10-CM | POA: Diagnosis present

## 2015-06-16 DIAGNOSIS — K567 Ileus, unspecified: Secondary | ICD-10-CM | POA: Diagnosis not present

## 2015-06-16 DIAGNOSIS — K5660 Unspecified intestinal obstruction: Secondary | ICD-10-CM | POA: Diagnosis not present

## 2015-06-16 DIAGNOSIS — Z932 Ileostomy status: Secondary | ICD-10-CM

## 2015-06-16 DIAGNOSIS — E1122 Type 2 diabetes mellitus with diabetic chronic kidney disease: Secondary | ICD-10-CM | POA: Diagnosis present

## 2015-06-16 DIAGNOSIS — N183 Chronic kidney disease, stage 3 (moderate): Secondary | ICD-10-CM | POA: Diagnosis present

## 2015-06-16 DIAGNOSIS — E039 Hypothyroidism, unspecified: Secondary | ICD-10-CM | POA: Diagnosis present

## 2015-06-16 DIAGNOSIS — D649 Anemia, unspecified: Secondary | ICD-10-CM | POA: Diagnosis present

## 2015-06-16 DIAGNOSIS — Z419 Encounter for procedure for purposes other than remedying health state, unspecified: Secondary | ICD-10-CM

## 2015-06-16 DIAGNOSIS — R112 Nausea with vomiting, unspecified: Secondary | ICD-10-CM

## 2015-06-16 DIAGNOSIS — H5441 Blindness, right eye, normal vision left eye: Secondary | ICD-10-CM | POA: Diagnosis present

## 2015-06-16 DIAGNOSIS — K838 Other specified diseases of biliary tract: Secondary | ICD-10-CM

## 2015-06-16 DIAGNOSIS — I129 Hypertensive chronic kidney disease with stage 1 through stage 4 chronic kidney disease, or unspecified chronic kidney disease: Secondary | ICD-10-CM | POA: Diagnosis present

## 2015-06-16 DIAGNOSIS — K56609 Unspecified intestinal obstruction, unspecified as to partial versus complete obstruction: Secondary | ICD-10-CM

## 2015-06-16 DIAGNOSIS — Z87891 Personal history of nicotine dependence: Secondary | ICD-10-CM

## 2015-06-16 DIAGNOSIS — K9189 Other postprocedural complications and disorders of digestive system: Secondary | ICD-10-CM

## 2015-06-16 DIAGNOSIS — Z9889 Other specified postprocedural states: Secondary | ICD-10-CM

## 2015-06-16 LAB — GLUCOSE, CAPILLARY
GLUCOSE-CAPILLARY: 57 mg/dL — AB (ref 65–99)
GLUCOSE-CAPILLARY: 72 mg/dL (ref 65–99)
GLUCOSE-CAPILLARY: 134 mg/dL — AB (ref 65–99)
Glucose-Capillary: 14 mg/dL — CL (ref 65–99)
Glucose-Capillary: 119 mg/dL — ABNORMAL HIGH (ref 65–99)

## 2015-06-16 LAB — CBC
HEMATOCRIT: 31.9 % — AB (ref 39.0–52.0)
HEMOGLOBIN: 9.8 g/dL — AB (ref 13.0–17.0)
MCH: 31.9 pg (ref 26.0–34.0)
MCHC: 30.7 g/dL (ref 30.0–36.0)
MCV: 103.9 fL — ABNORMAL HIGH (ref 78.0–100.0)
Platelets: 458 10*3/uL — ABNORMAL HIGH (ref 150–400)
RBC: 3.07 MIL/uL — ABNORMAL LOW (ref 4.22–5.81)
RDW: 12.8 % (ref 11.5–15.5)
WBC: 13.3 10*3/uL — AB (ref 4.0–10.5)

## 2015-06-16 LAB — BASIC METABOLIC PANEL
Anion gap: 7 (ref 5–15)
BUN: 34 mg/dL — ABNORMAL HIGH (ref 6–20)
CHLORIDE: 108 mmol/L (ref 101–111)
CO2: 22 mmol/L (ref 22–32)
Calcium: 9.1 mg/dL (ref 8.9–10.3)
Creatinine, Ser: 2.64 mg/dL — ABNORMAL HIGH (ref 0.61–1.24)
GFR calc Af Amer: 27 mL/min — ABNORMAL LOW (ref >60)
GFR, EST NON AFRICAN AMERICAN: 23 mL/min — AB (ref >60)
Glucose, Bld: 151 mg/dL — ABNORMAL HIGH (ref 65–99)
POTASSIUM: 4.4 mmol/L (ref 3.5–5.1)
SODIUM: 137 mmol/L (ref 135–145)

## 2015-06-16 MED ORDER — NEOMYCIN SULFATE 500 MG PO TABS
1000.0000 mg | ORAL_TABLET | Freq: Three times a day (TID) | ORAL | Status: AC
  Administered 2015-06-16 – 2015-06-17 (×3): 1000 mg via ORAL
  Filled 2015-06-16 (×5): qty 2

## 2015-06-16 MED ORDER — LEVOTHYROXINE SODIUM 100 MCG PO TABS
100.0000 ug | ORAL_TABLET | Freq: Every day | ORAL | Status: DC
  Administered 2015-06-18 – 2015-06-19 (×2): 100 ug via ORAL
  Filled 2015-06-16 (×2): qty 1

## 2015-06-16 MED ORDER — DEXTROSE 5 % IV SOLN
2.0000 g | INTRAVENOUS | Status: AC
  Administered 2015-06-17: 2 g via INTRAVENOUS
  Filled 2015-06-16: qty 2

## 2015-06-16 MED ORDER — ZOLPIDEM TARTRATE 5 MG PO TABS: 5.0000 mg | ORAL_TABLET | Freq: Every evening | ORAL | Status: DC | PRN

## 2015-06-16 MED ORDER — PANTOPRAZOLE SODIUM 40 MG IV SOLR
40.0000 mg | Freq: Every day | INTRAVENOUS | Status: DC
  Administered 2015-06-16: 40 mg via INTRAVENOUS
  Filled 2015-06-16: qty 40

## 2015-06-16 MED ORDER — AMLODIPINE BESYLATE 10 MG PO TABS
10.0000 mg | ORAL_TABLET | Freq: Every day | ORAL | Status: DC
  Administered 2015-06-17 – 2015-06-19 (×3): 10 mg via ORAL
  Filled 2015-06-16 (×3): qty 1

## 2015-06-16 MED ORDER — CARVEDILOL 12.5 MG PO TABS
12.5000 mg | ORAL_TABLET | Freq: Two times a day (BID) | ORAL | Status: DC
  Administered 2015-06-16 – 2015-06-19 (×6): 12.5 mg via ORAL
  Filled 2015-06-16 (×6): qty 1

## 2015-06-16 MED ORDER — ONDANSETRON 4 MG PO TBDP: 4.0000 mg | ORAL_TABLET | Freq: Four times a day (QID) | ORAL | Status: DC | PRN

## 2015-06-16 MED ORDER — SODIUM CHLORIDE 0.9 % IV SOLN
INTRAVENOUS | Status: DC
  Administered 2015-06-16 – 2015-06-18 (×3): via INTRAVENOUS

## 2015-06-16 MED ORDER — FINASTERIDE 5 MG PO TABS
5.0000 mg | ORAL_TABLET | Freq: Every day | ORAL | Status: DC
Start: 2015-06-17 — End: 2015-06-19
  Administered 2015-06-17 – 2015-06-19 (×3): 5 mg via ORAL
  Filled 2015-06-16 (×3): qty 1

## 2015-06-16 MED ORDER — MIRTAZAPINE 15 MG PO TABS
15.0000 mg | ORAL_TABLET | Freq: Every day | ORAL | Status: DC
  Administered 2015-06-16 – 2015-06-27 (×12): 15 mg via ORAL
  Filled 2015-06-16 (×12): qty 1

## 2015-06-16 MED ORDER — ONDANSETRON HCL 4 MG/2ML IJ SOLN
4.0000 mg | Freq: Four times a day (QID) | INTRAMUSCULAR | Status: DC | PRN
  Administered 2015-06-19 – 2015-06-24 (×5): 4 mg via INTRAVENOUS
  Filled 2015-06-16 (×6): qty 2

## 2015-06-16 MED ORDER — METRONIDAZOLE 500 MG PO TABS
1000.0000 mg | ORAL_TABLET | Freq: Three times a day (TID) | ORAL | Status: AC
  Administered 2015-06-16 – 2015-06-17 (×3): 1000 mg via ORAL
  Filled 2015-06-16 (×3): qty 2

## 2015-06-16 MED ORDER — TAMSULOSIN HCL 0.4 MG PO CAPS
0.8000 mg | ORAL_CAPSULE | ORAL | Status: DC
  Administered 2015-06-17 – 2015-06-19 (×2): 0.8 mg via ORAL
  Filled 2015-06-16 (×3): qty 2

## 2015-06-16 MED ORDER — INSULIN ASPART 100 UNIT/ML ~~LOC~~ SOLN
0.0000 [IU] | Freq: Three times a day (TID) | SUBCUTANEOUS | Status: DC
  Administered 2015-06-17 (×2): 2 [IU] via SUBCUTANEOUS
  Administered 2015-06-18 (×2): 3 [IU] via SUBCUTANEOUS
  Administered 2015-06-19: 2 [IU] via SUBCUTANEOUS
  Administered 2015-06-19: 3 [IU] via SUBCUTANEOUS

## 2015-06-16 NOTE — Anesthesia Preprocedure Evaluation (Addendum)
Anesthesia Evaluation  Patient identified by MRN, date of birth, ID band Patient awake    Reviewed: Allergy & Precautions, H&P , NPO status , Patient's Chart, lab work & pertinent test results  Airway Mallampati: II  TM Distance: >3 FB Neck ROM: full    Dental no notable dental hx. (+) Dental Advisory Given, Teeth Intact   Pulmonary neg pulmonary ROS, former smoker,    Pulmonary exam normal breath sounds clear to auscultation       Cardiovascular hypertension, Pt. on home beta blockers and Pt. on medications Normal cardiovascular exam Rhythm:regular Rate:Normal     Neuro/Psych Blind right eye negative neurological ROS  negative psych ROS   GI/Hepatic negative GI ROS, Neg liver ROS,   Endo/Other  diabetes, Well Controlled, Type 2, Insulin DependentHypothyroidism   Renal/GU Renal diseaseStage 3 chronic kidney disease. CRT 2.65  negative genitourinary   Musculoskeletal   Abdominal   Peds  Hematology negative hematology ROS (+) anemia , hgb 9.8   Anesthesia Other Findings   Reproductive/Obstetrics negative OB ROS                            Anesthesia Physical Anesthesia Plan  ASA: III  Anesthesia Plan: General   Post-op Pain Management:    Induction: Intravenous  Airway Management Planned: Oral ETT  Additional Equipment:   Intra-op Plan:   Post-operative Plan: Extubation in OR  Informed Consent: I have reviewed the patients History and Physical, chart, labs and discussed the procedure including the risks, benefits and alternatives for the proposed anesthesia with the patient or authorized representative who has indicated his/her understanding and acceptance.   Dental Advisory Given  Plan Discussed with: CRNA and Surgeon  Anesthesia Plan Comments:         Anesthesia Quick Evaluation

## 2015-06-16 NOTE — H&P (Signed)
Gabriel Lynch is scheduled for laparoscopic cholecystectomy and laparoscopic assisted ileostomy takedown 06/17/15. He has a history of chronic renal insufficiency and is followed by Dr. Elmarie Shiley. Dr. Posey Pronto has advised admission the day prior to surgery for IV fluids in order to reduce the risk of worsening of his renal function. Additionally, please see Dr. Marcello Moores' note as below.  Gabriel Lynch 05/03/2015 10:01 AM Location: [facility] Patient #: [id number] DOB: 1945-10-24 Single / Language: [name] / Race: Black or African American Male   History of Present Illness Gabriel Ruff MD; [id number] 10:18 AM) Patient words: discuss procedure.  The patient is a 70 year old male presenting to discuss diagnostic procedure results. Gabriel Lynch has a percutaneous cholecystostomy tube in place. It is been clamped for over a month and he is asymptomatic from that. He also has an ileostomy which is status post a subtotal colectomy in [facility] in Jan 16. He is very interested in having that reversed. He has been doing therapy at [place] and feels he is much stronger. It appears his total abdominal colectomy was for refractory ulcerative colitis. He states that he has been diagnosed with ulcerative colitis since 2006. He was not currently on any medication for this during his flare that ultimately led to his colectomy. His last colonoscopy was also in January 2000 1616 and showed severe ulcerative colitis. Biopsies were negative for any dysplasia. His colectomy was also positive for ulcerative colitis involving the entire colon with no signs of malignancy. He is doing well with his ileostomy. He denies any rectal bleeding. We did a follow-up proctoscopy last week. Biopsies do show some mild inflammation but no dysplasia.    Problem List/Past Medical Gabriel Ruff, MD; [id number] 10:42 AM) CHOLECYSTITIS WITH CHOLELITHIASIS (K80.10) ILEOSTOMY IN PLACE  (Z93.2) CHOLELITHIASIS (K80.20) QUIESCENT ULCERATIVE COLITIS WITHOUT COMPLICATION ([id number])  Allergies Mammie Lorenzo, LPN; 624THL 579FGE AM) Influenza Virus Vaccine *VACCINES* No Known Drug Allergies03/28/2017 (Marked as Inactive)  Medication History Mammie Lorenzo, LPN; 624THL D34-534 AM) Coreg (12.5MG  Tablet, Oral) Active. AmLODIPine Besylate (10MG  Tablet, Oral) Active. Aspirin (81MG  Tablet DR, Oral) Active. Ferrous Sulfate (325 (65 Fe)MG Tablet, Oral) Active. Questran (4GM Packet, Oral) Active. Finasteride (1MG  Tablet, Oral) Active. Multiple Vitamin (Oral) Active. Tamsulosin HCl (0.4MG  Capsule, Oral) Active. Levothyroxine Sodium (100MCG Tablet, Oral) Active. Omeprazole (20MG  Tablet DR, Oral) Active. HumaLOG (100UNIT/ML Solution, Subcutaneous) Active. Mirtazapine (15MG  Tablet, Oral) Active. Probiotic & Acidophilus Ex St (Oral) Active. Imodium (2MG  Capsule, Oral as needed) Active. Medications Reconciled  Vitals Claiborne Billings Dockery LPN; 624THL 579FGE AM) 05/03/2015 10:01 AM Weight: 190.2 lb Height: 56in Body Surface Area: 1.74 m Body Mass Index: 42.64 kg/m  Temp.: 97.59F(Oral)  Pulse: 56 (Regular)  BP: 140/80 (Sitting, Left Arm, Standard)       Physical Exam Gabriel Ruff MD; [id number] 10:42 AM) General Mental Status-Alert. General Appearance-Not in acute distress. Build & Nutrition-Well nourished. Posture-Normal posture. Gait-Normal.  Head and Neck Head-normocephalic, atraumatic with no lesions or palpable masses. Trachea-midline.  Chest and Lung Exam Chest and lung exam reveals -on auscultation, normal breath sounds, no adventitious sounds and normal vocal resonance.  Cardiovascular Cardiovascular examination reveals -normal heart sounds, regular rate and rhythm with no murmurs.  Abdomen Inspection Inspection of the abdomen reveals - No Hernias and Incision healing well without signs of infections or  seroma(no obvious hernias). Palpation/Percussion Palpation and Percussion of the abdomen reveal - Soft, Non Tender, No Rigidity (guarding), No hepatosplenomegaly and No Palpable abdominal masses.  Neurologic Neurologic evaluation reveals -  alert and oriented x 3 with no impairment of recent or remote memory, normal attention span and ability to concentrate, normal sensation and normal coordination.  Musculoskeletal Normal Exam - Bilateral-Upper Extremity Strength Normal and Lower Extremity Strength Normal.    Assessment & Plan Gabriel Ruff MD; [id number] 10:23 AM) Gabriel Lynch ULCERATIVE COLITIS WITHOUT COMPLICATION ([id number]) Impression: 70 year old male with ulcerative colitis status post total abdominal colectomy and diverting ileostomy. He developed an episode of cholecystitis that required drain placement. He is ready for his cholecystectomy now. He is in optimal medical condition currently. We discussed today that we will plan on doing a laparoscopic cholecystectomy. We will then use the laparoscope to look into the pelvis. If there is a large amount of scar tissue and we are unable to find the rectal stump we will stop. If the rectal stump appears readily available we will perform an ileorectal anastomosis. We discussed that this will require that he will restart his medication for ulcerative colitis. We discussed the risk of leak and complications with wound infection and abdominal abscess. He is agreement to proceed if the procedure will be simple The patient agrees that he would rather keep the ileostomy then undergo a difficult procedure with a prolonged recovery.    Signed by Gabriel Ruff, MD ([id number] 10:43 AM)  Georganna Skeans, MD, MPH, FACS Trauma: [phone] General Surgery: [phone]

## 2015-06-16 NOTE — Progress Notes (Signed)
RN made aware that patient had a blood sugar of 52. RN assessed patient, patient asymptomatic. Patient given two cartons of apple juice. On recheck, blood sugar was 119. Patient had not eaten since the morning time. Will continue to monitor patient.

## 2015-06-16 NOTE — Progress Notes (Signed)
Patient ID: Gabriel Lynch, male   DOB: [birth year], 70 y.o.   MRN: [id number] See H/P note. Admitted for pre-op IV fluids. Doing well.  A&O CV reg Lungs CTA Abd soft, ileostomy functional, perc chole tube in place. L shin chronic wound.  For laparoscopic cholecystectomy, cholangiogram, possible ileostomy takedown in AM. Procedure, risks, and benefits discussed with him and his sister. I answered their questions and discussed the expected post-op course. He is agreeable.  Georganna Skeans, MD, MPH, FACS Trauma: [phone] General Surgery: [phone]

## 2015-06-17 ENCOUNTER — Inpatient Hospital Stay (HOSPITAL_COMMUNITY): Payer: Medicare Other | Admitting: Emergency Medicine

## 2015-06-17 ENCOUNTER — Inpatient Hospital Stay (HOSPITAL_COMMUNITY): Payer: Medicare Other | Admitting: Certified Registered"

## 2015-06-17 ENCOUNTER — Encounter (HOSPITAL_COMMUNITY): Payer: Self-pay | Admitting: Certified Registered Nurse Anesthetist

## 2015-06-17 ENCOUNTER — Encounter (HOSPITAL_COMMUNITY)
Admission: RE | Disposition: A | Discharge status: Home / Self Care | Payer: Self-pay | Source: Ambulatory Visit | Attending: General Surgery

## 2015-06-17 ENCOUNTER — Inpatient Hospital Stay (HOSPITAL_COMMUNITY): Payer: Medicare Other

## 2015-06-17 HISTORY — PX: LAPAROSCOPIC LYSIS OF ADHESIONS: SHX5905

## 2015-06-17 HISTORY — PX: CHOLECYSTECTOMY: SHX55

## 2015-06-17 LAB — GLUCOSE, CAPILLARY
GLUCOSE-CAPILLARY: 128 mg/dL — AB (ref 65–99)
GLUCOSE-CAPILLARY: 171 mg/dL — AB (ref 65–99)
Glucose-Capillary: 110 mg/dL — ABNORMAL HIGH (ref 65–99)
Glucose-Capillary: 182 mg/dL — ABNORMAL HIGH (ref 65–99)
Glucose-Capillary: 147 mg/dL — ABNORMAL HIGH (ref 65–99)

## 2015-06-17 SURGERY — LYSIS, ADHESIONS, LAPAROSCOPIC
Anesthesia: General | Site: Abdomen

## 2015-06-17 MED ORDER — FENTANYL CITRATE (PF) 100 MCG/2ML IJ SOLN
INTRAMUSCULAR | Status: DC | PRN
  Administered 2015-06-17: 50 ug via INTRAVENOUS
  Administered 2015-06-17: 100 ug via INTRAVENOUS
  Administered 2015-06-17 (×3): 50 ug via INTRAVENOUS

## 2015-06-17 MED ORDER — MORPHINE SULFATE (PF) 2 MG/ML IV SOLN
2.0000 mg | INTRAVENOUS | Status: DC | PRN
  Administered 2015-06-17 (×2): 4 mg via INTRAVENOUS
  Filled 2015-06-17 (×2): qty 2

## 2015-06-17 MED ORDER — ONDANSETRON HCL 4 MG/2ML IJ SOLN
INTRAMUSCULAR | Status: AC
  Filled 2015-06-17: qty 2

## 2015-06-17 MED ORDER — EPHEDRINE SULFATE 50 MG/ML IJ SOLN
INTRAMUSCULAR | Status: DC | PRN
  Administered 2015-06-17 (×4): 5 mg via INTRAVENOUS

## 2015-06-17 MED ORDER — LIDOCAINE HCL (CARDIAC) 20 MG/ML IV SOLN
INTRAVENOUS | Status: DC | PRN
  Administered 2015-06-17: 60 mg via INTRAVENOUS

## 2015-06-17 MED ORDER — 0.9 % SODIUM CHLORIDE (POUR BTL) OPTIME
TOPICAL | Status: DC | PRN
  Administered 2015-06-17: 2000 mL

## 2015-06-17 MED ORDER — OXYCODONE HCL 5 MG PO TABS
5.0000 mg | ORAL_TABLET | ORAL | Status: DC | PRN
  Administered 2015-06-17 – 2015-06-18 (×8): 10 mg via ORAL
  Filled 2015-06-17 (×7): qty 2

## 2015-06-17 MED ORDER — IOPAMIDOL (ISOVUE-300) INJECTION 61%
INTRAVENOUS | Status: AC
  Filled 2015-06-17: qty 50

## 2015-06-17 MED ORDER — SODIUM CHLORIDE 0.9 % IV SOLN
INTRAVENOUS | Status: DC | PRN
  Administered 2015-06-17 (×2): via INTRAVENOUS

## 2015-06-17 MED ORDER — BUPIVACAINE-EPINEPHRINE (PF) 0.25% -1:200000 IJ SOLN
INTRAMUSCULAR | Status: AC
  Filled 2015-06-17: qty 30

## 2015-06-17 MED ORDER — FENTANYL CITRATE (PF) 250 MCG/5ML IJ SOLN
INTRAMUSCULAR | Status: AC
  Filled 2015-06-17: qty 5

## 2015-06-17 MED ORDER — PANTOPRAZOLE SODIUM 40 MG PO TBEC
40.0000 mg | DELAYED_RELEASE_TABLET | Freq: Every day | ORAL | Status: DC
  Administered 2015-06-17 – 2015-06-18 (×2): 40 mg via ORAL
  Filled 2015-06-17 (×2): qty 1

## 2015-06-17 MED ORDER — PROPOFOL 10 MG/ML IV BOLUS
INTRAVENOUS | Status: DC | PRN
  Administered 2015-06-17: 150 mg via INTRAVENOUS

## 2015-06-17 MED ORDER — LIDOCAINE 2% (20 MG/ML) 5 ML SYRINGE
INTRAMUSCULAR | Status: AC
  Filled 2015-06-17: qty 5

## 2015-06-17 MED ORDER — SODIUM CHLORIDE 0.9 % IR SOLN
Status: DC | PRN
  Administered 2015-06-17: 1000 mL

## 2015-06-17 MED ORDER — ENOXAPARIN SODIUM 30 MG/0.3ML ~~LOC~~ SOLN
30.0000 mg | SUBCUTANEOUS | Status: DC
  Administered 2015-06-18 – 2015-06-27 (×10): 30 mg via SUBCUTANEOUS
  Filled 2015-06-17 (×11): qty 0.3

## 2015-06-17 MED ORDER — MIDAZOLAM HCL 2 MG/2ML IJ SOLN
INTRAMUSCULAR | Status: AC
  Filled 2015-06-17: qty 2

## 2015-06-17 MED ORDER — OXYCODONE HCL 5 MG PO TABS
ORAL_TABLET | ORAL | Status: AC
  Filled 2015-06-17: qty 2

## 2015-06-17 MED ORDER — SODIUM CHLORIDE 0.9 % IV SOLN
INTRAVENOUS | Status: DC | PRN
  Administered 2015-06-17: 20 mL

## 2015-06-17 MED ORDER — LACTATED RINGERS IV SOLN: INTRAVENOUS | Status: DC

## 2015-06-17 MED ORDER — SUGAMMADEX SODIUM 200 MG/2ML IV SOLN
INTRAVENOUS | Status: DC | PRN
  Administered 2015-06-17: 200 mg via INTRAVENOUS

## 2015-06-17 MED ORDER — ONDANSETRON HCL 4 MG/2ML IJ SOLN
INTRAMUSCULAR | Status: DC | PRN
  Administered 2015-06-17: 4 mg via INTRAVENOUS

## 2015-06-17 MED ORDER — INSULIN ASPART 100 UNIT/ML ~~LOC~~ SOLN: 0.0000 [IU] | SUBCUTANEOUS | Status: DC

## 2015-06-17 MED ORDER — BUPIVACAINE-EPINEPHRINE 0.25% -1:200000 IJ SOLN
INTRAMUSCULAR | Status: DC | PRN
  Administered 2015-06-17: 22 mL

## 2015-06-17 MED ORDER — HYDROMORPHONE HCL 1 MG/ML IJ SOLN
INTRAMUSCULAR | Status: AC
  Administered 2015-06-17: 0.5 mg via INTRAVENOUS
  Filled 2015-06-17: qty 1

## 2015-06-17 MED ORDER — HYDROMORPHONE HCL 1 MG/ML IJ SOLN
0.2500 mg | INTRAMUSCULAR | Status: DC | PRN
  Administered 2015-06-17 (×2): 0.5 mg via INTRAVENOUS

## 2015-06-17 MED ORDER — MIDAZOLAM HCL 5 MG/5ML IJ SOLN
INTRAMUSCULAR | Status: DC | PRN
  Administered 2015-06-17: 2 mg via INTRAVENOUS

## 2015-06-17 MED ORDER — PROPOFOL 10 MG/ML IV BOLUS
INTRAVENOUS | Status: AC
  Filled 2015-06-17: qty 20

## 2015-06-17 MED ORDER — ROCURONIUM BROMIDE 100 MG/10ML IV SOLN
INTRAVENOUS | Status: DC | PRN
  Administered 2015-06-17: 50 mg via INTRAVENOUS

## 2015-06-17 SURGICAL SUPPLY — 47 items
APPLIER CLIP 5 13 M/L LIGAMAX5 (MISCELLANEOUS) ×3
BLADE SURG ROTATE 9660 (MISCELLANEOUS) IMPLANT
CANISTER SUCTION 2500CC (MISCELLANEOUS) ×3 IMPLANT
CHLORAPREP W/TINT 26ML (MISCELLANEOUS) ×3 IMPLANT
CLIP APPLIE 5 13 M/L LIGAMAX5 (MISCELLANEOUS) ×2 IMPLANT
COVER SURGICAL LIGHT HANDLE (MISCELLANEOUS) ×3 IMPLANT
DRAPE WARM FLUID 44X44 (DRAPE) ×3 IMPLANT
DRSG TEGADERM 4X4.75 (GAUZE/BANDAGES/DRESSINGS) ×3 IMPLANT
ELECT REM PT RETURN 9FT ADLT (ELECTROSURGICAL) ×3
ELECTRODE REM PT RTRN 9FT ADLT (ELECTROSURGICAL) ×2 IMPLANT
FILTER SMOKE EVAC LAPAROSHD (FILTER) IMPLANT
GLOVE BIO SURGEON STRL SZ 6.5 (GLOVE) ×3 IMPLANT
GLOVE BIO SURGEON STRL SZ8 (GLOVE) ×3 IMPLANT
GLOVE BIOGEL PI IND STRL 6.5 (GLOVE) ×2 IMPLANT
GLOVE BIOGEL PI IND STRL 7.0 (GLOVE) ×4 IMPLANT
GLOVE BIOGEL PI IND STRL 8 (GLOVE) ×2 IMPLANT
GLOVE BIOGEL PI INDICATOR 6.5 (GLOVE) ×1
GLOVE BIOGEL PI INDICATOR 7.0 (GLOVE) ×2
GLOVE BIOGEL PI INDICATOR 8 (GLOVE) ×1
GLOVE SURG SS PI 6.5 STRL IVOR (GLOVE) ×3 IMPLANT
GOWN STRL REUS W/ TWL LRG LVL3 (GOWN DISPOSABLE) ×4 IMPLANT
GOWN STRL REUS W/ TWL XL LVL3 (GOWN DISPOSABLE) ×2 IMPLANT
GOWN STRL REUS W/TWL LRG LVL3 (GOWN DISPOSABLE) ×2
GOWN STRL REUS W/TWL XL LVL3 (GOWN DISPOSABLE) ×1
KIT BASIN OR (CUSTOM PROCEDURE TRAY) ×3 IMPLANT
KIT OSTOMY DRAINABLE 2.75 STR (WOUND CARE) ×3 IMPLANT
KIT ROOM TURNOVER OR (KITS) ×3 IMPLANT
L-HOOK LAP DISP 36CM (ELECTROSURGICAL) ×3
LHOOK LAP DISP 36CM (ELECTROSURGICAL) ×2 IMPLANT
LIQUID BAND (GAUZE/BANDAGES/DRESSINGS) ×3 IMPLANT
NEEDLE 22X1 1/2 (OR ONLY) (NEEDLE) ×3 IMPLANT
NS IRRIG 1000ML POUR BTL (IV SOLUTION) ×3 IMPLANT
PAD ARMBOARD 7.5X6 YLW CONV (MISCELLANEOUS) ×3 IMPLANT
PENCIL BUTTON HOLSTER BLD 10FT (ELECTRODE) ×3 IMPLANT
POUCH RETRIEVAL ECOSAC 10 (ENDOMECHANICALS) ×2 IMPLANT
POUCH RETRIEVAL ECOSAC 10MM (ENDOMECHANICALS) ×1
SCISSORS LAP 5X35 DISP (ENDOMECHANICALS) ×3 IMPLANT
SET IRRIG TUBING LAPAROSCOPIC (IRRIGATION / IRRIGATOR) ×3 IMPLANT
SLEEVE ENDOPATH XCEL 5M (ENDOMECHANICALS) ×6 IMPLANT
SPECIMEN JAR SMALL (MISCELLANEOUS) ×3 IMPLANT
SUT VIC AB 4-0 PS2 27 (SUTURE) ×3 IMPLANT
TOWEL OR 17X24 6PK STRL BLUE (TOWEL DISPOSABLE) ×3 IMPLANT
TOWEL OR 17X26 10 PK STRL BLUE (TOWEL DISPOSABLE) ×3 IMPLANT
TRAY LAPAROSCOPIC MC (CUSTOM PROCEDURE TRAY) ×3 IMPLANT
TROCAR XCEL BLUNT TIP 100MML (ENDOMECHANICALS) ×3 IMPLANT
TROCAR XCEL NON-BLD 5MMX100MML (ENDOMECHANICALS) ×3 IMPLANT
TUBING INSUFFLATION (TUBING) ×3 IMPLANT

## 2015-06-17 NOTE — Transfer of Care (Signed)
Immediate Anesthesia Transfer of Care Note  Patient: Gabriel Lynch  Procedure(s) Performed: Procedure(s): LAPAROSCOPIC LYSIS OF ADHESIONS for 30 minutes LAPAROSCOPIC CHOLECYSTECTOMY WITH INTRAOPERATIVE CHOLANGIOGRAM  Patient Location: PACU  Anesthesia Type:General  Level of Consciousness: awake and patient cooperative  Airway & Oxygen Therapy: Patient Spontanous Breathing/ 02 [place]   Post-op Assessment: Report given to RN  Post vital signs: stable  Last Vitals:  Filed Vitals:   06/17/15 0223 06/17/15 0435  BP: 163/68 147/71  Pulse: 52 50  Temp: 36.7 C 36.8 C  Resp: 15 16    Last Pain: There were no vitals filed for this visit.    Patients Stated Pain Goal: 0 ([id number])  Complications: No apparent anesthesia complications

## 2015-06-17 NOTE — Anesthesia Postprocedure Evaluation (Signed)
Anesthesia Post Note  Patient: Psychologist, clinical  Procedure(s) Performed: Procedure(s): LAPAROSCOPIC LYSIS OF ADHESIONS for 30 minutes LAPAROSCOPIC CHOLECYSTECTOMY WITH INTRAOPERATIVE CHOLANGIOGRAM  Patient location during evaluation: PACU Anesthesia Type: General Level of consciousness: awake and alert Pain management: pain level controlled Vital Signs Assessment: post-procedure vital signs reviewed and stable Respiratory status: spontaneous breathing, nonlabored ventilation, respiratory function stable and patient connected to nasal cannula oxygen Cardiovascular status: blood pressure returned to baseline and stable Postop Assessment: no signs of nausea or vomiting Anesthetic complications: no    Last Vitals:  Filed Vitals:   06/17/15 1000 06/17/15 1022  BP: 173/80 177/82  Pulse: 68 68  Temp: 36.7 C 36.7 C  Resp: 20 20    Last Pain:  Filed Vitals:   06/17/15 1032  PainSc: 2                  Darielys Giglia L

## 2015-06-17 NOTE — Interval H&P Note (Signed)
History and Physical Interval Note:  06/17/2015 6:54 AM  Gabriel Lynch  has presented today for surgery, with the diagnosis of cholecystitis ulcerative colitis s/p ileostomy to colectomy  The various methods of treatment have been discussed with the patient and family. After consideration of risks, benefits and other options for treatment, the patient has consented to  Procedure(s): LAPAROSCOPIC CHOLECYSTECTOMY (N/A) POSSIBLE ILEOSTOMY REVERSAL (N/A) as a surgical intervention .  The patient's history has been reviewed, patient examined, no change in status, stable for surgery.  I have reviewed the patient's chart and labs.  Questions were answered to the patient's satisfaction.     Hayde Kilgour E

## 2015-06-17 NOTE — Op Note (Addendum)
06/16/2015 - 06/17/2015  9:19 AM  PATIENT:  Gabriel Lynch  70 y.o. male  PRE-OPERATIVE DIAGNOSIS:  cholecystitis ulcerative colitis s/p ileostomy to colectomy  POST-OPERATIVE DIAGNOSIS:  cholecystitis ulcerative colitis s/p ileostomy to colectomy  PROCEDURE:  Procedure(s): LAPAROSCOPIC LYSIS OF ADHESIONS 30MIN LAPAROSCOPIC CHOLECYSTECTOMY WITH INTRAOPERATIVE CHOLANGIOGRAM  SURGEON:  Georganna Skeans, MD  ASSISTANTS: [name], M.D.  ANESTHESIA:   local and general  EBL:  Total I/O In: -  Out: 190 [Urine:190]  BLOOD ADMINISTERED:none  DRAINS: none   SPECIMEN:  Excision  DISPOSITION OF SPECIMEN:  PATHOLOGY  COUNTS:  YES  DICTATION: .Dragon Dictation Gabriel Lynch presents for laparoscopic cholecystectomy with cholangiogram and possible ileostomy takedown. He was admitted yesterday for preoperative intravenous hydration per the recommendations of Dr. Posey Pronto from [facility]. He was identified in the preop holding area. Informed consent was obtained. He received intravenous antibiotics. He was brought to the operating room and general endotracheal anesthesia was administered by the anesthesia staff. He was placed in lithotomy position. Percutaneous cholecystostomy tube was removed without difficulty. Ileostomy appliance was removed and the site was covered with a Ray-Tec and Tegaderm. Abdomen and perineum were all prepped and draped in a sterile fashion. We did time out procedure. Supraumbilical area was infiltrated with local. Supraumbilical incision was made. Subcutaneous tissues were dissected down revealing the anterior fascia. This was divided sharply along the midline and then careful dissection was used to gain entry to the perineal cavity. A pursestring was placed around the fascial opening of 0 Vicryl. Hassan trocar was inserted. Abdomen was insufflated with carbon dioxide in standard fashion. Laparoscopic exploration revealed adhesions around the ileostomy site  and lower midline. The gallbladder was easily visualized. A 5 mm epigastric and 5 mm lateral port 2 were placed. Local was used at each port site. The dome the gallbladder was dissected free from the scarred tract of the previous cholecystostomy tube. Abdomen the gallbladder was retracted superior medially. Infundibulum was retracted inferolaterally. Dissection began laterally and progressed medially easily identifying the cystic duct. Dissection continued until critical view between the cystic duct, the infundibulum and the liver. Once we had excellent visualization, a clip was placed on the infundibular cystic duct junction. A small nick was made in the cystic duct and a cholangiocatheter was inserted. Intraoperative cholangiogram demonstrated good flow of contrast into the duodenum with no common bile duct filling defects. Cholangiogram catheter was removed. 3 clips were placed proximally on the cystic duct and it was divided. Further dissection revealed the cystic artery which was clipped twice proximally, once distally and divided. Gallbladder was taken off the liver bed using cautery and getting excellent hemostasis. It was placed in a bag and removed from the abdomen via the supraumbilical port site. Liver bed was copiously irrigated. Irrigation returned clear. Excellent hemostasis was obtained. Clips were in good position and liver bed was dry. At this point Dr. Marcello Moores proceeded with her portion of the procedure with laparoscopic lysis of adhesions. Adhesions of the small bowel to the anterior abdominal wall were carefully taken down. Once we were able to visualize the pelvis, the rectum was not visible at all. There were also further small bowel adhesions in the pelvis. We were unable to even see a hint of the rectum so we did not proceed with ileostomy takedown. The small bowel was closely inspected and there were no complications from the adhesiolysis. At the completion of that, 4 quadrant inspection was  done and no other complicating features were noted. Pneumoperitoneum  was released. Ports were removed and the supraumbilical fascia was closed by tying the pursestring suture. An additional 0 Vicryl figure-of-eight suture was placed to reinforce the closure. All 4 wounds were copiously irrigated and the skin of each was closed with running 4 Vicryl subcuticular followed by liquid band. The buttock granulation tissue from the cholecystostomy tube exit site was excised and cauterized. Hemostasis was obtained there. Sterile dressings and a new ostomy appliance were applied. He tolerated the procedure without apparent complication was taken recovery in stable condition. PATIENT DISPOSITION:  PACU - hemodynamically stable.   Delay start of Pharmacological VTE agent (>24hrs) due to surgical blood loss or risk of bleeding:  no  Georganna Skeans, MD, MPH, FACS Pager: [phone]  6/1/20179:19 AM

## 2015-06-17 NOTE — Anesthesia Procedure Notes (Signed)
Procedure Name: Intubation Date/Time: 06/17/2015 7:33 AM Performed by: Oletta Lamas Pre-anesthesia Checklist: Patient identified, Emergency Drugs available, Suction available, Patient being monitored and Timeout performed Patient Re-evaluated:Patient Re-evaluated prior to inductionOxygen Delivery Method: Circle System Utilized Preoxygenation: Pre-oxygenation with 100% oxygen Intubation Type: IV induction Ventilation: Mask ventilation without difficulty Laryngoscope Size: Mac and 3 Grade View: Grade II Tube type: Oral Tube size: 8.0 mm Number of attempts: 1 Airway Equipment and Method: Stylet and Oral airway Placement Confirmation: ETT inserted through vocal cords under direct vision,  positive ETCO2 and breath sounds checked- equal and bilateral Secured at: 22 cm Tube secured with: Tape Dental Injury: Teeth and Oropharynx as per pre-operative assessment

## 2015-06-18 ENCOUNTER — Encounter (HOSPITAL_COMMUNITY): Payer: Self-pay | Admitting: General Surgery

## 2015-06-18 LAB — GLUCOSE, CAPILLARY
GLUCOSE-CAPILLARY: 119 mg/dL — AB (ref 65–99)
Glucose-Capillary: 176 mg/dL — ABNORMAL HIGH (ref 65–99)
Glucose-Capillary: 189 mg/dL — ABNORMAL HIGH (ref 65–99)
Glucose-Capillary: 158 mg/dL — ABNORMAL HIGH (ref 65–99)

## 2015-06-18 LAB — BASIC METABOLIC PANEL
ANION GAP: 7 (ref 5–15)
BUN: 21 mg/dL — ABNORMAL HIGH (ref 6–20)
CALCIUM: 8.8 mg/dL — AB (ref 8.9–10.3)
CO2: 22 mmol/L (ref 22–32)
Chloride: 107 mmol/L (ref 101–111)
Creatinine, Ser: 2.34 mg/dL — ABNORMAL HIGH (ref 0.61–1.24)
GFR, EST AFRICAN AMERICAN: 31 mL/min — AB (ref >60)
GFR, EST NON AFRICAN AMERICAN: 27 mL/min — AB (ref >60)
GLUCOSE: 172 mg/dL — AB (ref 65–99)
Potassium: 4.5 mmol/L (ref 3.5–5.1)
SODIUM: 136 mmol/L (ref 135–145)

## 2015-06-18 MED ORDER — OXYCODONE HCL 5 MG PO TABS: 5.0000 mg | ORAL_TABLET | ORAL | Status: AC | PRN

## 2015-06-18 MED ORDER — METHOCARBAMOL 500 MG PO TABS: 500.0000 mg | ORAL_TABLET | Freq: Three times a day (TID) | ORAL | Status: AC | PRN

## 2015-06-18 MED ORDER — METHOCARBAMOL 500 MG PO TABS: 500.0000 mg | ORAL_TABLET | Freq: Three times a day (TID) | ORAL | Status: DC | PRN

## 2015-06-18 NOTE — Progress Notes (Signed)
1 Day Post-Op  Subjective: Having a lot of back pain. Has not gotten UOB. Mild abdominal soreness.  Objective: Vital signs in last 24 hours: Temp:  [97.6 F (36.4 C)-100.1 F (37.8 C)] 99.3 F (37.4 C) (06/02 0612) Pulse Rate:  [62-78] 76 (06/02 0612) Resp:  [10-20] 18 (06/02 0612) BP: (152-177)/(73-86) 164/76 mmHg (06/02 0612) SpO2:  [94 %-100 %] 95 % (06/02 0612) Last BM Date: 06/15/16  Intake/Output from previous day: 06/01 0701 - 06/02 0700 In: 4109 [P.O.:1150; I.V.:2959] Out: 2820 [Urine:2780; Blood:40] Intake/Output this shift:    General appearance: cooperative Resp: clear to auscultation bilaterally Cardio: S1, S2 normal GI: incisions CDI, ileostomy functional  Lab Results:   Recent Labs  06/16/15 1358  WBC 13.3*  HGB 9.8*  HCT 31.9*  PLT 458*   BMET  Recent Labs  06/16/15 1358 06/18/15 0541  NA 137 136  K 4.4 4.5  CL 108 107  CO2 22 22  GLUCOSE 151* 172*  BUN 34* 21*  CREATININE 2.64* 2.34*  CALCIUM 9.1 8.8*   PT/INR No results for input(s): LABPROT, INR in the last 72 hours. ABG No results for input(s): PHART, HCO3 in the last 72 hours.  Invalid input(s): PCO2, PO2  Studies/Results: Dg Cholangiogram Operative  06/17/2015  CLINICAL DATA:  Cholelithiasis EXAM: INTRAOPERATIVE CHOLANGIOGRAM TECHNIQUE: Cholangiographic images from the C-arm fluoroscopic device were submitted for interpretation post-operatively. Please see the procedural report for the amount of contrast and the fluoroscopy time utilized. COMPARISON:  None. FINDINGS: Injection in the cystic duct remnant reveals visualization of the intrahepatic and extrahepatic biliary tree. Free flow contrast into the duodenum is noted. No definitive filling defect is seen. 19 seconds of fluoroscopy was utilized. IMPRESSION: No definitive filling defect is noted. Electronically Signed   By: Inez Catalina M.D.   On: 06/17/2015 09:13    Anti-infectives: Anti-infectives    Start     Dose/Rate Route  Frequency Ordered Stop   06/17/15 0700  cefoTEtan (CEFOTAN) 2 g in dextrose 5 % 50 mL IVPB     2 g 100 mL/hr over 30 Minutes Intravenous To Surgery 06/16/15 1307 06/17/15 0755   06/16/15 1400  metroNIDAZOLE (FLAGYL) tablet 1,000 mg     1,000 mg Oral Every 8 hours 06/16/15 1306 06/17/15 0625   06/16/15 1400  neomycin (MYCIFRADIN) tablet 1,000 mg     1,000 mg Oral Every 8 hours 06/16/15 1306 06/17/15 0625      Assessment/Plan: s/p Procedure(s): LAPAROSCOPIC LYSIS OF ADHESIONS for 30 minutes LAPAROSCOPIC CHOLECYSTECTOMY WITH INTRAOPERATIVE CHOLANGIOGRAM POD#1 Back pain - add Robaxin PRN, air overlay. PT to eval today. CRI - CRT down, KVO IVF VTE - Lovenox Dispo - hopefully can D/C tomorrow if does well with PT  LOS: 2 days    Gabriel Lynch 06/18/2015

## 2015-06-19 ENCOUNTER — Inpatient Hospital Stay (HOSPITAL_COMMUNITY): Payer: Medicare Other

## 2015-06-19 LAB — GLUCOSE, CAPILLARY
GLUCOSE-CAPILLARY: 192 mg/dL — AB (ref 65–99)
GLUCOSE-CAPILLARY: 144 mg/dL — AB (ref 65–99)
GLUCOSE-CAPILLARY: 153 mg/dL — AB (ref 65–99)

## 2015-06-19 LAB — CBC
HCT: 35.6 % — ABNORMAL LOW (ref 39.0–52.0)
Hemoglobin: 11.1 g/dL — ABNORMAL LOW (ref 13.0–17.0)
MCH: 31.9 pg (ref 26.0–34.0)
MCHC: 31.2 g/dL (ref 30.0–36.0)
MCV: 102.3 fL — AB (ref 78.0–100.0)
PLATELETS: 458 10*3/uL — AB (ref 150–400)
RBC: 3.48 MIL/uL — ABNORMAL LOW (ref 4.22–5.81)
RDW: 12.9 % (ref 11.5–15.5)
WBC: 25.5 10*3/uL — ABNORMAL HIGH (ref 4.0–10.5)

## 2015-06-19 LAB — COMPREHENSIVE METABOLIC PANEL
ALT: 25 U/L (ref 17–63)
ANION GAP: 9 (ref 5–15)
AST: 21 U/L (ref 15–41)
Albumin: 3 g/dL — ABNORMAL LOW (ref 3.5–5.0)
Alkaline Phosphatase: 93 U/L (ref 38–126)
BUN: 31 mg/dL — ABNORMAL HIGH (ref 6–20)
CALCIUM: 9.2 mg/dL (ref 8.9–10.3)
CHLORIDE: 102 mmol/L (ref 101–111)
CO2: 24 mmol/L (ref 22–32)
Creatinine, Ser: 2.82 mg/dL — ABNORMAL HIGH (ref 0.61–1.24)
GFR, EST AFRICAN AMERICAN: 25 mL/min — AB (ref >60)
GFR, EST NON AFRICAN AMERICAN: 21 mL/min — AB (ref >60)
Glucose, Bld: 200 mg/dL — ABNORMAL HIGH (ref 65–99)
Potassium: 4.5 mmol/L (ref 3.5–5.1)
SODIUM: 135 mmol/L (ref 135–145)
Total Bilirubin: 0.6 mg/dL (ref 0.3–1.2)
Total Protein: 7 g/dL (ref 6.5–8.1)

## 2015-06-19 LAB — LIPASE, BLOOD: Lipase: 16 U/L (ref 11–51)

## 2015-06-19 MED ORDER — INSULIN ASPART 100 UNIT/ML ~~LOC~~ SOLN
0.0000 [IU] | Freq: Four times a day (QID) | SUBCUTANEOUS | Status: DC
  Administered 2015-06-19: 3 [IU] via SUBCUTANEOUS
  Administered 2015-06-20 (×3): 2 [IU] via SUBCUTANEOUS
  Administered 2015-06-21: 3 [IU] via SUBCUTANEOUS
  Administered 2015-06-21: 2 [IU] via SUBCUTANEOUS
  Administered 2015-06-21 – 2015-06-22 (×4): 3 [IU] via SUBCUTANEOUS
  Administered 2015-06-23: 5 [IU] via SUBCUTANEOUS
  Administered 2015-06-23 – 2015-06-24 (×4): 3 [IU] via SUBCUTANEOUS
  Administered 2015-06-24: 5 [IU] via SUBCUTANEOUS
  Administered 2015-06-24 – 2015-06-25 (×5): 3 [IU] via SUBCUTANEOUS
  Administered 2015-06-26: 2 [IU] via SUBCUTANEOUS
  Administered 2015-06-26 (×2): 3 [IU] via SUBCUTANEOUS

## 2015-06-19 MED ORDER — DIATRIZOATE MEGLUMINE & SODIUM 66-10 % PO SOLN
ORAL | Status: AC
  Administered 2015-06-19: 19:00:00
  Filled 2015-06-19: qty 30

## 2015-06-19 MED ORDER — LEVOTHYROXINE SODIUM 100 MCG IV SOLR
50.0000 ug | Freq: Every day | INTRAVENOUS | Status: DC
  Administered 2015-06-20 – 2015-06-22 (×3): 50 ug via INTRAVENOUS
  Filled 2015-06-19 (×3): qty 5

## 2015-06-19 MED ORDER — FAMOTIDINE IN NACL 20-0.9 MG/50ML-% IV SOLN
20.0000 mg | Freq: Two times a day (BID) | INTRAVENOUS | Status: DC
  Administered 2015-06-19 – 2015-06-21 (×5): 20 mg via INTRAVENOUS
  Filled 2015-06-19 (×7): qty 50

## 2015-06-19 MED ORDER — MORPHINE SULFATE (PF) 2 MG/ML IV SOLN
1.0000 mg | INTRAVENOUS | Status: DC | PRN
  Administered 2015-06-19 (×3): 1 mg via INTRAVENOUS
  Filled 2015-06-19 (×3): qty 1

## 2015-06-19 MED ORDER — METOPROLOL TARTRATE 5 MG/5ML IV SOLN
5.0000 mg | Freq: Four times a day (QID) | INTRAVENOUS | Status: DC | PRN
  Administered 2015-06-19 – 2015-06-27 (×7): 5 mg via INTRAVENOUS
  Filled 2015-06-19 (×7): qty 5

## 2015-06-19 MED ORDER — SODIUM CHLORIDE 0.9 % IV SOLN
INTRAVENOUS | Status: DC
Start: 2015-06-19 — End: 2015-06-21
  Administered 2015-06-19 – 2015-06-20 (×3): via INTRAVENOUS
  Filled 2015-06-19: qty 1000

## 2015-06-19 MED ORDER — PIPERACILLIN-TAZOBACTAM 3.375 G IVPB
3.3750 g | Freq: Three times a day (TID) | INTRAVENOUS | Status: DC
  Administered 2015-06-19 – 2015-06-23 (×12): 3.375 g via INTRAVENOUS
  Filled 2015-06-19 (×13): qty 50

## 2015-06-19 MED ORDER — ALUM & MAG HYDROXIDE-SIMETH 200-200-20 MG/5ML PO SUSP
30.0000 mL | Freq: Four times a day (QID) | ORAL | Status: DC | PRN
  Administered 2015-06-19: 30 mL via ORAL
  Filled 2015-06-19: qty 30

## 2015-06-19 NOTE — Progress Notes (Signed)
2 Days Post-Op  Subjective: Alert and pleasant. Not much pain Nauseated and feels like he is going to vomit Doesn't feel that ileostomy output is normal.  Much less than usual Up to chair yesterday but did not ambulate in hall CBGs well-controlled, range 119-189. Otherwise no labs today.  Objective: Vital signs in last 24 hours: Temp:  [98.8 F (37.1 C)-99.3 F (37.4 C)] 98.9 F (37.2 C) (06/03 0516) Pulse Rate:  [73-79] 79 (06/03 0516) Resp:  [17-18] 17 (06/03 0516) BP: (152-174)/(70-77) 167/70 mmHg (06/03 0516) SpO2:  [93 %-96 %] 93 % (06/03 0516) Last BM Date: 06/18/15  Intake/Output from previous day: 06/02 0701 - 06/03 0700 In: 1607 [P.O.:1260; I.V.:347] Out: 975 [Urine:925; Stool:50] Intake/Output this shift: Total I/O In: 220 [P.O.:220] Out: 475 [Urine:475]  General appearance: Alert.  Mental status normal.  Cooperative.  Mild distress from nausea Resp: clear to auscultation bilaterally GI: Abdomen soft with minimal tenderness but does seem distended, a little tympanitic.  Hypoactive bowel sounds.  Wounds look fine.  Ileostomy pink.  Small amount of stool and air in bag.  Lab Results:  Results for orders placed or performed during the hospital encounter of 06/16/15 (from the past 24 hour(s))  Glucose, capillary     Status: Abnormal   Collection Time: 06/18/15  7:42 AM  Result Value Ref Range   Glucose-Capillary 176 (H) 65 - 99 mg/dL  Glucose, capillary     Status: Abnormal   Collection Time: 06/18/15 11:48 AM  Result Value Ref Range   Glucose-Capillary 189 (H) 65 - 99 mg/dL  Glucose, capillary     Status: Abnormal   Collection Time: 06/18/15  5:13 PM  Result Value Ref Range   Glucose-Capillary 119 (H) 65 - 99 mg/dL  Glucose, capillary     Status: Abnormal   Collection Time: 06/18/15  9:46 PM  Result Value Ref Range   Glucose-Capillary 158 (H) 65 - 99 mg/dL     Studies/Results: No results found.  Marland Kitchen amLODipine  10 mg Oral Daily  . carvedilol  12.5 mg  Oral BID WC  . enoxaparin (LOVENOX) injection  30 mg Subcutaneous Q24H  . finasteride  5 mg Oral Daily  . insulin aspart  0-15 Units Subcutaneous TID WC  . levothyroxine  100 mcg Oral QAC breakfast  . mirtazapine  15 mg Oral QHS  . pantoprazole  40 mg Oral QHS  . tamsulosin  0.8 mg Oral QODAY     Assessment/Plan: s/p Procedure(s): LAPAROSCOPIC LYSIS OF ADHESIONS for 30 minutes LAPAROSCOPIC CHOLECYSTECTOMY WITH INTRAOPERATIVE CHOLANGIOGRAM  POD #2.  Laparoscopic cholecystectomy,IOC, LOA Seems to have an ileus.  Possibly related to extent of adhesion lysis I told him not to eat anything if he is nauseated Resume IV fluids Check labs and abdominal x-rays  Ileostomy in place CK D, stage III Diabetes mellitus Hypertension Ulcerative colitis, status post subtotal colectomy History osteomyelitis of sacrum    @PROBHOSP @  LOS: 3 days    Nathali Vent M 06/19/2015  . .prob

## 2015-06-19 NOTE — Evaluation (Signed)
Physical Therapy Evaluation Patient Details Name: Gabriel Lynch MRN: [id number] DOB: 02/25/1945 Today's Date: 06/19/2015   History of Present Illness  70 y/o male s/p laparoscopic cholecystectomy with IOC, laparoscopic lysis of adhesions and aborted take down ileostomy, 06/17/15. Pt now with NG tube.  PMH includes colostomy and ileostomy, HTN, DM  Clinical Impression  Pt admitted with above diagnosis. Pt currently with functional limitations due to the deficits listed below (see PT Problem List).  Pt will benefit from skilled PT to increase their independence and safety with mobility to allow discharge to the venue listed below.  Pt moving well, but required encouragement to ambulate.  He reports he normally walks 1 mile a day several days a week.  Education on importance of mobility within hospital setting to prevent strength loss.  Spoke with nursing regarding ambulating with pt and they are in agreement.  Will follow acutely for gait and stair training, but will not need any PT after dc.     Follow Up Recommendations No PT follow up    Equipment Recommendations  None recommended by PT    Recommendations for Other Services       Precautions / Restrictions Precautions Precautions: Other (comment) Precaution Comments: NG tube, ostomy bag      Mobility  Bed Mobility               General bed mobility comments: up in recliner upon arrival  Transfers Overall transfer level: Modified independent                  Ambulation/Gait Ambulation/Gait assistance: Min guard;Supervision Ambulation Distance (Feet): 510 Feet Assistive device:  (IV pole) Gait Pattern/deviations: Step-through pattern     General Gait Details: Pt amb with min/guard to S with IV pole with good step through pattern.  Slight unsteadiness at times, but no LOB and probably due to not being up much .  Stairs            Wheelchair Mobility    Modified Rankin (Stroke Patients Only)        Balance Overall balance assessment: No apparent balance deficits (not formally assessed)                                           Pertinent Vitals/Pain Pain Assessment: 0-10 Pain Score: 4  Pain Location: back Pain Descriptors / Indicators: Aching Pain Intervention(s): Premedicated before session    Home Living Family/patient expects to be discharged to:: Private residence Living Arrangements: Alone   Type of Home: House Home Access: Stairs to enter Entrance Stairs-Rails: Right Entrance Stairs-Number of Steps: 3 Home Layout: One level        Prior Function Level of Independence: Independent               Hand Dominance   Dominant Hand: Right    Extremity/Trunk Assessment   Upper Extremity Assessment: Overall WFL for tasks assessed           Lower Extremity Assessment: Overall WFL for tasks assessed      Cervical / Trunk Assessment: Normal  Communication   Communication: No difficulties  Cognition Arousal/Alertness: Awake/alert Behavior During Therapy: WFL for tasks assessed/performed Overall Cognitive Status: Within Functional Limits for tasks assessed                      General Comments General comments (skin  integrity, edema, etc.): Nurse clamped NG tube    Exercises        Assessment/Plan    PT Assessment Patient needs continued PT services  PT Diagnosis Difficulty walking   PT Problem List Decreased activity tolerance;Decreased mobility  PT Treatment Interventions DME instruction;Gait training;Stair training;Functional mobility training;Balance training   PT Goals (Current goals can be found in the Care Plan section) Acute Rehab PT Goals Patient Stated Goal: go home PT Goal Formulation: With patient Time For Goal Achievement: 06/26/15 Potential to Achieve Goals: Good    Frequency Min 3X/week   Barriers to discharge        Co-evaluation               End of Session   Activity Tolerance:  Patient tolerated treatment well Patient left: in chair;with call bell/phone within reach Nurse Communication: Mobility status         Time: [id number] PT Time Calculation (min) (ACUTE ONLY): 18 min   Charges:   PT Evaluation $PT Eval Low Complexity: 1 Procedure     PT G Codes:        Blayne Garlick LUBECK 06/19/2015, 5:08 PM

## 2015-06-19 NOTE — Progress Notes (Signed)
2 Days Post-Op  Subjective: No having pain but very little out of the ileostomy.  Not really tender and tolerating clears.    Objective: Vital signs in last 24 hours: Temp:  [98.8 F (37.1 C)-98.9 F (37.2 C)] 98.9 F (37.2 C) 07/09/22 0516) Pulse Rate:  [73-79] 79 07-09-2022 0516) Resp:  [17-18] 17 2022/07/09 0516) BP: (152-174)/(70-77) 167/70 mmHg 07-09-2022 0516) SpO2:  [93 %-96 %] 93 % 2022/07/09 0516) Last BM Date: 06/18/15 Only 50 ml recorded from ostomy 925 urine 1260 PO yesterday, 300 recorded for this AM.   Afebrile, VSS Creatinine is up to 2.82 LFT's are normal WBC is up to 25.5 H/H is also up Film this AM shows dilated SB loops with air fluid levers in abd and pelvis   Intake/Output from previous day: 06/02 0701 - 2022/07/09 0700 In: [id number] [P.O.:1260; I.V.:347] Out: 975 [Urine:925; Stool:50] Intake/Output this shift: Total I/O In: 300 [P.O.:300] Out: 250 [Urine:250]  General appearance: alert, cooperative and no distress GI: soft, distended, sites OK , very little coming from the ileostomy.  No BS. He is not really having significant pain.    Lab Results:   Recent Labs  06/16/15 1358 Jul 09, 2015 0718  WBC 13.3* 25.5*  HGB 9.8* 11.1*  HCT 31.9* 35.6*  PLT 458* 458*    BMET  Recent Labs  06/18/15 0541 2015-07-09 0718  NA 136 135  K 4.5 4.5  CL 107 102  CO2 22 24  GLUCOSE 172* 200*  BUN 21* 31*  CREATININE 2.34* 2.82*  CALCIUM 8.8* 9.2   PT/INR No results for input(s): LABPROT, INR in the last 72 hours.   Recent Labs Lab 07-09-2015 0718  AST 21  ALT 25  ALKPHOS 93  BILITOT 0.6  PROT 7.0  ALBUMIN 3.0*     Lipase     Component Value Date/Time   LIPASE 16 09-Jul-2015 0718     Studies/Results: Dg Abd 2 Views  07-09-15  CLINICAL DATA:  Postop nausea, vomiting.  Recent cholecystectomy. EXAM: ABDOMEN - 2 VIEW COMPARISON:  None. FINDINGS: Dilated small bowel loops throughout the abdomen and pelvis. Scattered air-fluid levels on upright view. Prior  cholecystectomy. No free air organomegaly. IMPRESSION: Dilated small bowel loops with air-fluid levels in the abdomen and pelvis. This could reflect postoperative ileus or small bowel obstruction. Electronically Signed   By: Rolm Baptise M.D.   On: July 09, 2015 10:11    Medications: . amLODipine  10 mg Oral Daily  . carvedilol  12.5 mg Oral BID WC  . enoxaparin (LOVENOX) injection  30 mg Subcutaneous Q24H  . finasteride  5 mg Oral Daily  . insulin aspart  0-15 Units Subcutaneous TID WC  . levothyroxine  100 mcg Oral QAC breakfast  . mirtazapine  15 mg Oral QHS  . pantoprazole  40 mg Oral QHS  . tamsulosin  0.8 mg Oral QODAY   . sodium chloride 0.9 % 1,000 mL infusion 100 mL/hr at Jul 09, 2015 [id number]    Assessment/Plan Hx of ulcerative colitis with subtotal colectomy and ileostomy Hx of cholecystostomy 02/2014, C diff colitis 07/2014, Decubitus ulcers 07/2014 S/p laparoscopic cholecystectomy with IOC, laparoscopic lysis of adhesions and aborted take down ileostomy, 06/17/15, Dr. Grandville Silos and Marcello Moores   Leukocytosis  Chronic renal disease  Stage III Hypertension AODM Hx of osteomyelitis of sacrum    Plan:  Discussed with Dr. Dalbert Batman, starting Zosyn, NG to decompress, increase fluids, Ct later this afternoon without IV contrast.  Hold Coreg, Norvasc, synthroid, flomax, and finastride for now.  Lopressor 5 mg IV q6h while he is NPO. IV synthroid.  IV pepcid for now.    LOS: 3 days    Trina Asch 06/19/2015 [phone]

## 2015-06-19 NOTE — Progress Notes (Signed)
Pharmacy Antibiotic Note  Gabriel Lynch is a 70 y.o. male admitted on 06/16/2015 s/p lap chole and lysis of adhesions. Hx of ulcerative colitis with colectomy and and ileostomy. Also has hx of osteo of sacrum.  Pharmacy has been consulted for Zosyn dosing.  Plan: Start Zosyn 3.375 gm IV q8h (4 hour infusion) Monitor clinical picture, renal function F/U C&S, abx deescalation / LOT   Height: 6\' 1"  (185.4 cm) Weight: 192 lb 4.8 oz (87.227 kg) IBW/kg (Calculated) : 79.9  Temp (24hrs), Avg:98.9 F (37.2 C), Min:98.8 F (37.1 C), Max:98.9 F (37.2 C)   Recent Labs Lab 06/16/15 1358 06/18/15 0541 06/19/15 0718  WBC 13.3*  --  25.5*  CREATININE 2.64* 2.34* 2.82*    Estimated Creatinine Clearance: 28.3 mL/min (by C-G formula based on Cr of 2.82).    Allergies  Allergen Reactions  . Influenza Vaccines Anaphylaxis    ON MAR  . Other Other (See Comments)    Seasonal (sinus) allergies    Antimicrobials this admission: Zosyn 6/3 >>   Dose adjustments this admission: n/a  Microbiology results: n/a  Thank you for allowing pharmacy to be a part of this patient's care.  Reginia Naas 06/19/2015 11:18 AM

## 2015-06-20 ENCOUNTER — Inpatient Hospital Stay (HOSPITAL_COMMUNITY): Payer: Medicare Other

## 2015-06-20 LAB — COMPREHENSIVE METABOLIC PANEL
ALBUMIN: 2.4 g/dL — AB (ref 3.5–5.0)
ALK PHOS: 94 U/L (ref 38–126)
ALT: 18 U/L (ref 17–63)
AST: 32 U/L (ref 15–41)
Anion gap: 8 (ref 5–15)
BUN: 33 mg/dL — AB (ref 6–20)
CALCIUM: 8.7 mg/dL — AB (ref 8.9–10.3)
CHLORIDE: 107 mmol/L (ref 101–111)
CO2: 22 mmol/L (ref 22–32)
CREATININE: 2.73 mg/dL — AB (ref 0.61–1.24)
GFR calc non Af Amer: 22 mL/min — ABNORMAL LOW (ref >60)
GFR, EST AFRICAN AMERICAN: 26 mL/min — AB (ref >60)
GLUCOSE: 113 mg/dL — AB (ref 65–99)
Potassium: 4.9 mmol/L (ref 3.5–5.1)
SODIUM: 137 mmol/L (ref 135–145)
Total Bilirubin: 1.7 mg/dL — ABNORMAL HIGH (ref 0.3–1.2)
Total Protein: 5.9 g/dL — ABNORMAL LOW (ref 6.5–8.1)

## 2015-06-20 LAB — GLUCOSE, CAPILLARY
GLUCOSE-CAPILLARY: 140 mg/dL — AB (ref 65–99)
GLUCOSE-CAPILLARY: 145 mg/dL — AB (ref 65–99)
Glucose-Capillary: 133 mg/dL — ABNORMAL HIGH (ref 65–99)
Glucose-Capillary: 122 mg/dL — ABNORMAL HIGH (ref 65–99)

## 2015-06-20 LAB — CBC
HCT: 32.6 % — ABNORMAL LOW (ref 39.0–52.0)
HEMOGLOBIN: 10.2 g/dL — AB (ref 13.0–17.0)
MCH: 32.3 pg (ref 26.0–34.0)
MCHC: 31.3 g/dL (ref 30.0–36.0)
MCV: 103.2 fL — ABNORMAL HIGH (ref 78.0–100.0)
PLATELETS: ADEQUATE 10*3/uL (ref 150–400)
RBC: 3.16 MIL/uL — AB (ref 4.22–5.81)
RDW: 13 % (ref 11.5–15.5)
WBC: 21.5 10*3/uL — AB (ref 4.0–10.5)

## 2015-06-20 MED ORDER — CHLORHEXIDINE GLUCONATE CLOTH 2 % EX PADS
6.0000 | MEDICATED_PAD | Freq: Every day | CUTANEOUS | Status: AC
  Administered 2015-06-20 – 2015-06-24 (×5): 6 via TOPICAL

## 2015-06-20 MED ORDER — MUPIROCIN 2 % EX OINT
1.0000 "application " | TOPICAL_OINTMENT | Freq: Two times a day (BID) | CUTANEOUS | Status: AC
  Administered 2015-06-20 – 2015-06-24 (×10): 1 via NASAL
  Filled 2015-06-20 (×2): qty 22

## 2015-06-20 MED ORDER — TECHNETIUM TC 99M MEBROFENIN IV KIT
5.4800 | PACK | Freq: Once | INTRAVENOUS | Status: AC | PRN
  Administered 2015-06-20: 5 via INTRAVENOUS

## 2015-06-20 MED ORDER — CETYLPYRIDINIUM CHLORIDE 0.05 % MT LIQD
7.0000 mL | Freq: Two times a day (BID) | OROMUCOSAL | Status: DC
  Administered 2015-06-20 – 2015-06-25 (×8): 7 mL via OROMUCOSAL

## 2015-06-20 NOTE — Progress Notes (Signed)
3 Days Post-Op  Subjective: Alert.  Cooperative.  States he feels better because there is much less pressure on his abdomen No pain at rest.  Abdomen a little bit uncomfortable when he ambulates. No stool or flatus NG tube in and has drained almost 2000 mL in last 24 hours.  Passing some stool and flatus per ileostomy, but much less than normal according to him.  CT scan shows what looks like SBO with some proximal dilatation and distal collapse, but unfortunately the CT scan did not include the pelvis.  There is some free fluid and a little bit of postop free air.  Afebrile.  Heart rate 71.  WBC 21,500.  Hemoglobin 10.2. Liver function tests are basically normal other than total bilirubin 1.7.  Objective: Vital signs in last 24 hours: Temp:  [98.1 F (36.7 C)-99.4 F (37.4 C)] 98.7 F (37.1 C) (06/04 0400) Pulse Rate:  [65-73] 71 (06/04 0400) Resp:  [16-18] 16 (06/04 0400) BP: (162-182)/(70-89) 162/70 mmHg (06/04 0400) SpO2:  [93 %-96 %] 93 % (06/04 0400) Last BM Date: 06/20/15  Intake/Output from previous day: 06/03 0701 - 06/04 0700 In: 2816.5 [P.O.:300; I.V.:2516.5] Out: 2900 [Urine:1750; Emesis/NG output:1100; Stool:50] Intake/Output this shift: Total I/O In: 30 [NG/GT:30] Out: 375 [Urine:375]  General appearance:  Alert.  Pleasant.  Doesn't appear to be in any distress, despite NG tube.  Mental status normal Resp: clear to auscultation bilaterally GI: Soft.  Rare bowel sounds.  Slightly distended.  Wounds okay.  Ostomy has a little bit of gas and air in bag.  Lab Results:  Results for orders placed or performed during the hospital encounter of 06/16/15 (from the past 24 hour(s))  Glucose, capillary     Status: Abnormal   Collection Time: 06/19/15 12:17 PM  Result Value Ref Range   Glucose-Capillary 144 (H) 65 - 99 mg/dL  Glucose, capillary     Status: Abnormal   Collection Time: 06/19/15  5:21 PM  Result Value Ref Range   Glucose-Capillary 153 (H) 65 - 99 mg/dL   Glucose, capillary     Status: Abnormal   Collection Time: 06/19/15 11:59 PM  Result Value Ref Range   Glucose-Capillary 145 (H) 65 - 99 mg/dL  CBC     Status: Abnormal   Collection Time: 06/20/15  3:13 AM  Result Value Ref Range   WBC 21.5 (H) 4.0 - 10.5 K/uL   RBC 3.16 (L) 4.22 - 5.81 MIL/uL   Hemoglobin 10.2 (L) 13.0 - 17.0 g/dL   HCT 32.6 (L) 39.0 - 52.0 %   MCV 103.2 (H) 78.0 - 100.0 fL   MCH 32.3 26.0 - 34.0 pg   MCHC 31.3 30.0 - 36.0 g/dL   RDW 13.0 11.5 - 15.5 %   Platelets  150 - 400 K/uL    PLATELET CLUMPS NOTED ON SMEAR, COUNT APPEARS ADEQUATE  Comprehensive metabolic panel     Status: Abnormal   Collection Time: 06/20/15  3:13 AM  Result Value Ref Range   Sodium 137 135 - 145 mmol/L   Potassium 4.9 3.5 - 5.1 mmol/L   Chloride 107 101 - 111 mmol/L   CO2 22 22 - 32 mmol/L   Glucose, Bld 113 (H) 65 - 99 mg/dL   BUN 33 (H) 6 - 20 mg/dL   Creatinine, Ser 2.73 (H) 0.61 - 1.24 mg/dL   Calcium 8.7 (L) 8.9 - 10.3 mg/dL   Total Protein 5.9 (L) 6.5 - 8.1 g/dL   Albumin 2.4 (L) 3.5 - 5.0  g/dL   AST 32 15 - 41 U/L   ALT 18 17 - 63 U/L   Alkaline Phosphatase 94 38 - 126 U/L   Total Bilirubin 1.7 (H) 0.3 - 1.2 mg/dL   GFR calc non Af Amer 22 (L) >60 mL/min   GFR calc Af Amer 26 (L) >60 mL/min   Anion gap 8 5 - 15  Glucose, capillary     Status: Abnormal   Collection Time: 06/20/15  5:53 AM  Result Value Ref Range   Glucose-Capillary 133 (H) 65 - 99 mg/dL     Studies/Results: Ct Abdomen Wo Contrast  06/19/2015  CLINICAL DATA:  Cholecystectomy 2 days ago.  Abdominal distention. EXAM: CT ABDOMEN WITHOUT CONTRAST TECHNIQUE: Multidetector CT imaging of the abdomen was performed following the standard protocol without IV contrast. COMPARISON:  March 06, 2014 FINDINGS: There are small bilateral pleural effusions and atelectasis. Lung bases are otherwise normal. An NG tube terminates in the stomach. There is contrast in the stomach. Contrast reaches the level of the mid jejunum  but does not proceed more distally by the time of imaging. There are multiple dilated loops of small bowel. The small bowel is not visualized in its entirety as only the abdomen was performed and there are small bowel loops which extend into the pelvis. There is a right lower quadrant ileostomy. Several loops of small bowel are decompressed compared to the more proximal and mid small bowel. Free air is identified consistent with cholecystectomy 2 days ago. There is also ascites in the right side of the abdomen including in the gallbladder fossa and extending inferiorly into the pericolic gutter. The liver is unremarkable. The pancreas, adrenal glands, and spleen are normal. There is a cyst in the right kidney. The kidneys are otherwise normal. No aneurysm. Atherosclerosis is seen in the aorta. No adenopathy. Visualized bone are unremarkable other than degenerative changes in the spine. IMPRESSION: 1. This study is limited as only and abdomen was performed and loops of small bowel do extend into the pelvis. However, the dilated small bowel with decompressed loops distally is most consistent with small bowel obstruction rather than a postop ileus despite the recent surgery. 2. Free air is consistent with recent cholecystectomy. 3. There is ascites including fluid in the gallbladder fossa extending into the right pericolic gutter. This is nonspecific and could be postoperative. However, a bile leak cannot be excluded on this study. If there is concern for a bile leak, a nuclear medicine study would be more specific. Electronically Signed   By: Dorise Bullion III M.D   On: 06/19/2015 20:26    . antiseptic oral rinse  7 mL Mouth Rinse BID  . Chlorhexidine Gluconate Cloth  6 each Topical Q0600  . enoxaparin (LOVENOX) injection  30 mg Subcutaneous Q24H  . famotidine (PEPCID) IV  20 mg Intravenous Q12H  . insulin aspart  0-15 Units Subcutaneous Q6H  . levothyroxine  50 mcg Intravenous Daily  . mirtazapine  15 mg  Oral QHS  . mupirocin ointment  1 application Nasal BID  . piperacillin-tazobactam (ZOSYN)  IV  3.375 g Intravenous Q8H     Assessment/Plan: s/p Procedure(s): LAPAROSCOPIC LYSIS OF ADHESIONS for 30 minutes LAPAROSCOPIC CHOLECYSTECTOMY WITH INTRAOPERATIVE CHOLANGIOGRAM  POD #3.  Laparoscopic cholecystectomy with cholangiogram, extensive lysis of adhesions Post op abdominal distention, leukocytosis, and apparent SBO  I suspect the SBO may be due to acute adhesions, or may be just an ileus secondary to the extensive lysis of adhesions. He seems  perfectly stable and doesn't act like a bile leak, but I'm going to get hepatobiliary scan today anyway  Continue NG  Empiric antibiotics due to significant leukocytosis, which is not well explained Check lab and abdominal x-rays tomorrow  Ileostomy in place CK D, stage III Diabetes mellitus Hypertension Ulcerative colitis, status post subtotal colectomy History osteomyelitis of sacrum  @PROBHOSP @  LOS: 4 days    Imer Foxworth M 06/20/2015  . .prob

## 2015-06-21 LAB — CBC
HCT: 34 % — ABNORMAL LOW (ref 39.0–52.0)
Hemoglobin: 10.3 g/dL — ABNORMAL LOW (ref 13.0–17.0)
MCH: 31.4 pg (ref 26.0–34.0)
MCHC: 30.3 g/dL (ref 30.0–36.0)
MCV: 103.7 fL — ABNORMAL HIGH (ref 78.0–100.0)
PLATELETS: 432 10*3/uL — AB (ref 150–400)
RBC: 3.28 MIL/uL — ABNORMAL LOW (ref 4.22–5.81)
RDW: 12.8 % (ref 11.5–15.5)
WBC: 19.2 10*3/uL — AB (ref 4.0–10.5)

## 2015-06-21 LAB — COMPREHENSIVE METABOLIC PANEL
ALBUMIN: 2.8 g/dL — AB (ref 3.5–5.0)
ALT: 19 U/L (ref 17–63)
ANION GAP: 10 (ref 5–15)
AST: 19 U/L (ref 15–41)
Alkaline Phosphatase: 139 U/L — ABNORMAL HIGH (ref 38–126)
BUN: 32 mg/dL — ABNORMAL HIGH (ref 6–20)
CO2: 21 mmol/L — AB (ref 22–32)
Calcium: 9 mg/dL (ref 8.9–10.3)
Chloride: 111 mmol/L (ref 101–111)
Creatinine, Ser: 2.77 mg/dL — ABNORMAL HIGH (ref 0.61–1.24)
GFR calc non Af Amer: 22 mL/min — ABNORMAL LOW (ref >60)
GFR, EST AFRICAN AMERICAN: 25 mL/min — AB (ref >60)
GLUCOSE: 108 mg/dL — AB (ref 65–99)
POTASSIUM: 4.2 mmol/L (ref 3.5–5.1)
SODIUM: 142 mmol/L (ref 135–145)
Total Bilirubin: 1.6 mg/dL — ABNORMAL HIGH (ref 0.3–1.2)
Total Protein: 6.8 g/dL (ref 6.5–8.1)

## 2015-06-21 LAB — GLUCOSE, CAPILLARY
GLUCOSE-CAPILLARY: 95 mg/dL (ref 65–99)
GLUCOSE-CAPILLARY: 155 mg/dL — AB (ref 65–99)
GLUCOSE-CAPILLARY: 125 mg/dL — AB (ref 65–99)
Glucose-Capillary: 135 mg/dL — ABNORMAL HIGH (ref 65–99)
Glucose-Capillary: 178 mg/dL — ABNORMAL HIGH (ref 65–99)

## 2015-06-21 MED ORDER — SODIUM CHLORIDE 0.9 % IV SOLN
12.5000 mg | Freq: Four times a day (QID) | INTRAVENOUS | Status: DC | PRN
  Filled 2015-06-21: qty 0.5

## 2015-06-21 MED ORDER — DEXTROSE-NACL 5-0.9 % IV SOLN
INTRAVENOUS | Status: DC
  Administered 2015-06-21 – 2015-06-24 (×5): via INTRAVENOUS
  Filled 2015-06-21 (×12): qty 1000

## 2015-06-21 MED ORDER — FAMOTIDINE IN NACL 20-0.9 MG/50ML-% IV SOLN
20.0000 mg | INTRAVENOUS | Status: DC
  Administered 2015-06-22 – 2015-06-23 (×2): 20 mg via INTRAVENOUS
  Filled 2015-06-21 (×2): qty 50

## 2015-06-21 NOTE — Progress Notes (Signed)
4 Days Post-Op  Subjective: Some gas and stool from ileostomy  Objective: Vital signs in last 24 hours: Temp:  [98 F (36.7 C)-98.7 F (37.1 C)] 98 F (36.7 C) (06/05 0615) Pulse Rate:  [72-73] 73 (06/05 0615) Resp:  [17-18] 18 (06/05 0615) BP: (163-176)/(74-76) 176/76 mmHg (06/05 0615) SpO2:  [94 %-98 %] 98 % (06/05 0615) Last BM Date: 06/20/15  Intake/Output from previous day: 06/04 0701 - 06/05 0700 In: 3580.8 [I.V.:3170.8; NG/GT:60; IV Piggyback:350] Out: 3275 [Urine:2325; Emesis/NG output:700; Stool:250] Intake/Output this shift:    General appearance: cooperative Resp: clear to auscultation bilaterally Cardio: regular rate and rhythm GI: soft, mod distended, a few BS, air and stool in bag, incisions CDI  Lab Results:   Recent Labs  06/20/15 0313 06/21/15 0559  WBC 21.5* 19.2*  HGB 10.2* 10.3*  HCT 32.6* 34.0*  PLT PLATELET CLUMPS NOTED ON SMEAR, COUNT APPEARS ADEQUATE 432*   BMET  Recent Labs  06/19/15 0718 06/20/15 0313  NA 135 137  K 4.5 4.9  CL 102 107  CO2 24 22  GLUCOSE 200* 113*  BUN 31* 33*  CREATININE 2.82* 2.73*  CALCIUM 9.2 8.7*   Anti-infectives: Anti-infectives    Start     Dose/Rate Route Frequency Ordered Stop   06/19/15 1200  piperacillin-tazobactam (ZOSYN) IVPB 3.375 g     3.375 g 12.5 mL/hr over 240 Minutes Intravenous Every 8 hours 06/19/15 1118     06/17/15 0700  cefoTEtan (CEFOTAN) 2 g in dextrose 5 % 50 mL IVPB     2 g 100 mL/hr over 30 Minutes Intravenous To Surgery 06/16/15 1307 06/17/15 0755   06/16/15 1400  metroNIDAZOLE (FLAGYL) tablet 1,000 mg     1,000 mg Oral Every 8 hours 06/16/15 1306 06/17/15 0625   06/16/15 1400  neomycin (MYCIFRADIN) tablet 1,000 mg     1,000 mg Oral Every 8 hours 06/16/15 1306 06/17/15 0625      Assessment/Plan: s/p Procedure(s): LAPAROSCOPIC LYSIS OF ADHESIONS for 30 minutes LAPAROSCOPIC CHOLECYSTECTOMY WITH INTRAOPERATIVE CHOLANGIOGRAM POD#4 Ileus/SBO - likely ileus after LOA,  continue NGT, abdominal exam is not concerning, some bowel function returning CRI - IVF, BMET in AM FEN - change IVF to avoid hypoglycemia DM - SSI ID - WBC trending down, afeb, consider D/C Zosyn tomorrow Chronic wound LLE - dressing changes VTE - Lovenox Dispo - ileus  LOS: 5 days    Gabriel Lynch E 06/21/2015

## 2015-06-21 NOTE — Progress Notes (Addendum)
PT Cancellation Note  Patient Details Name: Gabriel Lynch MRN: [id number] DOB: November 05, 1945   Cancelled Treatment:    Reason Eval/Treat Not Completed: Patient declined stating he had visitors all AM and needs to rest.  Discussed importance of OOB.  Will check back as schedule permits.  Addendum: Checked back on pt later and sleeping soundly and did not awaken to PT knocking and speaking to him.  Will check on pt tomorrow.   Zakyra Kukuk LUBECK 06/21/2015, 1:16 PM

## 2015-06-22 LAB — CBC
HCT: 31.7 % — ABNORMAL LOW (ref 39.0–52.0)
HEMOGLOBIN: 9.7 g/dL — AB (ref 13.0–17.0)
MCH: 32 pg (ref 26.0–34.0)
MCHC: 30.6 g/dL (ref 30.0–36.0)
MCV: 104.6 fL — ABNORMAL HIGH (ref 78.0–100.0)
Platelets: 482 10*3/uL — ABNORMAL HIGH (ref 150–400)
RBC: 3.03 MIL/uL — AB (ref 4.22–5.81)
RDW: 12.9 % (ref 11.5–15.5)
WBC: 15.7 10*3/uL — ABNORMAL HIGH (ref 4.0–10.5)

## 2015-06-22 LAB — BASIC METABOLIC PANEL
Anion gap: 9 (ref 5–15)
BUN: 27 mg/dL — ABNORMAL HIGH (ref 6–20)
CHLORIDE: 112 mmol/L — AB (ref 101–111)
CO2: 21 mmol/L — ABNORMAL LOW (ref 22–32)
Calcium: 8.9 mg/dL (ref 8.9–10.3)
Creatinine, Ser: 2.51 mg/dL — ABNORMAL HIGH (ref 0.61–1.24)
GFR calc non Af Amer: 25 mL/min — ABNORMAL LOW (ref >60)
GFR, EST AFRICAN AMERICAN: 29 mL/min — AB (ref >60)
Glucose, Bld: 191 mg/dL — ABNORMAL HIGH (ref 65–99)
POTASSIUM: 3.7 mmol/L (ref 3.5–5.1)
SODIUM: 142 mmol/L (ref 135–145)

## 2015-06-22 LAB — GLUCOSE, CAPILLARY
GLUCOSE-CAPILLARY: 174 mg/dL — AB (ref 65–99)
Glucose-Capillary: 173 mg/dL — ABNORMAL HIGH (ref 65–99)
Glucose-Capillary: 167 mg/dL — ABNORMAL HIGH (ref 65–99)

## 2015-06-22 MED ORDER — AMLODIPINE BESYLATE 10 MG PO TABS
10.0000 mg | ORAL_TABLET | Freq: Every day | ORAL | Status: DC
  Administered 2015-06-22 – 2015-06-28 (×7): 10 mg via ORAL
  Filled 2015-06-22 (×7): qty 1

## 2015-06-22 MED ORDER — HYDRALAZINE HCL 20 MG/ML IJ SOLN
INTRAMUSCULAR | Status: AC
  Administered 2015-06-22: 10 mg via INTRAVENOUS
  Filled 2015-06-22: qty 1

## 2015-06-22 MED ORDER — HYDRALAZINE HCL 20 MG/ML IJ SOLN
10.0000 mg | Freq: Four times a day (QID) | INTRAMUSCULAR | Status: DC | PRN
  Administered 2015-06-22 – 2015-06-24 (×6): 10 mg via INTRAVENOUS
  Filled 2015-06-22 (×5): qty 1

## 2015-06-22 NOTE — Progress Notes (Signed)
Physical Therapy Treatment & discharge Patient Details Name: Gabriel Lynch MRN: [id number] DOB: Sep 23, 1945 Today's Date: 06/22/2015    History of Present Illness 70 y/o male s/p laparoscopic cholecystectomy with IOC, laparoscopic lysis of adhesions and aborted take down ileostomy, 06/17/15. Pt now with NG tube.  PMH includes colostomy and ileostomy, HTN, DM    PT Comments    Pt able to manage steps today with UE support and walked a lap of hospital with IV Pole.  Do not feel pt needs skilled PT intervention anymore and recommend transitioning to the [facility] mobility team to keep pt ambulating.  Discussed with mobility tech and she observed pt ambulating with PT.  Will d/c from PT at this time.   Follow Up Recommendations  No PT follow up     Equipment Recommendations  None recommended by PT    Recommendations for Other Services       Precautions / Restrictions Precautions Precautions: Other (comment) Precaution Comments: NG tube, ostomy bag    Mobility  Bed Mobility Overal bed mobility: Modified Independent                Transfers Overall transfer level: Modified independent                  Ambulation/Gait Ambulation/Gait assistance: Supervision;Modified independent (Device/Increase time) Ambulation Distance (Feet): 510 Feet Assistive device:  (IV pole) Gait Pattern/deviations: Step-through pattern     General Gait Details: Amb with S to MOD I a lap of hospital floor with IV pole and cues to keep it to side to not kick it.     Stairs Stairs: Yes Stairs assistance: Supervision Stair Management:  (held IV pole) Number of Stairs: 2 General stair comments: up to forwards and then 2 backwards due to IV pole  Wheelchair Mobility    Modified Rankin (Stroke Patients Only)       Balance Overall balance assessment: No apparent balance deficits (not formally assessed)                                  Cognition Arousal/Alertness:  Awake/alert Behavior During Therapy: WFL for tasks assessed/performed Overall Cognitive Status: Within Functional Limits for tasks assessed                      Exercises      General Comments General comments (skin integrity, edema, etc.): Pt declined to sit up in recliner due to chronic back pain      Pertinent Vitals/Pain Pain Assessment: Faces Faces Pain Scale: Hurts a little bit Pain Location: chronic back pain Pain Intervention(s): Repositioned    Home Living                      Prior Function            PT Goals (current goals can now be found in the care plan section) Acute Rehab PT Goals Patient Stated Goal: go home PT Goal Formulation: All assessment and education complete, DC therapy Potential to Achieve Goals: Good Progress towards PT goals: Goals met/education completed, patient discharged from PT    Frequency       PT Plan Current plan remains appropriate    Co-evaluation             End of Session   Activity Tolerance: Patient tolerated treatment well Patient left: in bed;with call bell/phone within reach  Time: 4103-0131 PT Time Calculation (min) (ACUTE ONLY): 17 min  Charges:  $Gait Training: 8-22 mins                    G Codes:      Gabriel Lynch 06/22/2015, 1:10 PM

## 2015-06-22 NOTE — Progress Notes (Signed)
Patient has a blood pressure of 203/85. PRN Lopressor given 4hours ago. Available Q6 PRN. PA made aware.

## 2015-06-22 NOTE — Progress Notes (Signed)
5 Days Post-Op  Subjective: More air and stool from ostomy. Feels better.  Objective: Vital signs in last 24 hours: Temp:  [98 F (36.7 C)-98.6 F (37 C)] 98.6 F (37 C) (06/06 0544) Pulse Rate:  [62-79] 62 (06/06 0544) Resp:  [18-19] 19 (06/06 0544) BP: (190-200)/(77-86) 190/86 mmHg (06/06 0544) SpO2:  [97 %-98 %] 98 % (06/06 0544) Last BM Date: 06/21/15  Intake/Output from previous day: 06/05 0701 - 06/06 0700 In: 2654.2 [I.V.:2654.2] Out: 2475 [Urine:1800; Emesis/NG output:575; Stool:100] Intake/Output this shift: Total I/O In: 2654.2 [I.V.:2654.2] Out: 1600 [Urine:1000; Emesis/NG output:500; Stool:100]  General appearance: cooperative Resp: clear to auscultation bilaterally Cardio: regular rate and rhythm GI: soft, less distended, NT, a lot of liquid stool in bag  Lab Results:   Recent Labs  06/20/15 0313 06/21/15 0559  WBC 21.5* 19.2*  HGB 10.2* 10.3*  HCT 32.6* 34.0*  PLT PLATELET CLUMPS NOTED ON SMEAR, COUNT APPEARS ADEQUATE 432*   BMET   Anti-infectives: Anti-infectives    Start     Dose/Rate Route Frequency Ordered Stop   06/19/15 1200  piperacillin-tazobactam (ZOSYN) IVPB 3.375 g     3.375 g 12.5 mL/hr over 240 Minutes Intravenous Every 8 hours 06/19/15 1118     06/17/15 0700  cefoTEtan (CEFOTAN) 2 g in dextrose 5 % 50 mL IVPB     2 g 100 mL/hr over 30 Minutes Intravenous To Surgery 06/16/15 1307 06/17/15 0755   06/16/15 1400  metroNIDAZOLE (FLAGYL) tablet 1,000 mg     1,000 mg Oral Every 8 hours 06/16/15 1306 06/17/15 0625   06/16/15 1400  neomycin (MYCIFRADIN) tablet 1,000 mg     1,000 mg Oral Every 8 hours 06/16/15 1306 06/17/15 0625      Assessment/Plan: s/p Procedure(s): LAPAROSCOPIC LYSIS OF ADHESIONS for 30 minutes LAPAROSCOPIC CHOLECYSTECTOMY WITH INTRAOPERATIVE CHOLANGIOGRAM POD#5 Ileus/SBO - likely ileus after LOA, clamp NGT for 4h and D/C if tolerates then try clears CRI - IVF, BMET in AM FEN - above, labs P DM - SSI ID - D/C  Zosyn HTN - PRN hydralazine while off home meds Chronic wound LLE - dressing changes VTE - Lovenox Dispo - ileus  LOS: 6 days    Hailie Searight E 06/22/2015

## 2015-06-22 NOTE — Progress Notes (Signed)
Patient's NG tube unclamped at 1100 to check residual. Residual at 150. Per order, NG tube can come out when residual is less than 200 however patient ",felt nauseous for a second but it went away". Patient's tube clamped until 1500. Residual rechecked, 68mL in canister. NG tube removed. Order placed for patient to be on a clear liquid diet. Will continue to monitor.

## 2015-06-23 LAB — CBC
HEMATOCRIT: 30.9 % — AB (ref 39.0–52.0)
Hemoglobin: 9.4 g/dL — ABNORMAL LOW (ref 13.0–17.0)
MCH: 31.4 pg (ref 26.0–34.0)
MCHC: 30.4 g/dL (ref 30.0–36.0)
MCV: 103.3 fL — ABNORMAL HIGH (ref 78.0–100.0)
Platelets: 480 10*3/uL — ABNORMAL HIGH (ref 150–400)
RBC: 2.99 MIL/uL — ABNORMAL LOW (ref 4.22–5.81)
RDW: 13 % (ref 11.5–15.5)
WBC: 15.4 10*3/uL — ABNORMAL HIGH (ref 4.0–10.5)

## 2015-06-23 LAB — GLUCOSE, CAPILLARY
GLUCOSE-CAPILLARY: 152 mg/dL — AB (ref 65–99)
GLUCOSE-CAPILLARY: 154 mg/dL — AB (ref 65–99)
GLUCOSE-CAPILLARY: 159 mg/dL — AB (ref 65–99)
GLUCOSE-CAPILLARY: 230 mg/dL — AB (ref 65–99)
Glucose-Capillary: 179 mg/dL — ABNORMAL HIGH (ref 65–99)

## 2015-06-23 MED ORDER — CARVEDILOL 12.5 MG PO TABS
12.5000 mg | ORAL_TABLET | Freq: Two times a day (BID) | ORAL | Status: DC
  Administered 2015-06-23 – 2015-06-28 (×11): 12.5 mg via ORAL
  Filled 2015-06-23 (×12): qty 1

## 2015-06-23 MED ORDER — TAMSULOSIN HCL 0.4 MG PO CAPS
0.4000 mg | ORAL_CAPSULE | ORAL | Status: DC
  Administered 2015-06-23 – 2015-06-27 (×3): 0.4 mg via ORAL
  Filled 2015-06-23 (×3): qty 1

## 2015-06-23 MED ORDER — FINASTERIDE 5 MG PO TABS
5.0000 mg | ORAL_TABLET | Freq: Every day | ORAL | Status: DC
  Administered 2015-06-23 – 2015-06-28 (×6): 5 mg via ORAL
  Filled 2015-06-23 (×6): qty 1

## 2015-06-23 MED ORDER — FAMOTIDINE 20 MG PO TABS
20.0000 mg | ORAL_TABLET | Freq: Every day | ORAL | Status: DC
  Administered 2015-06-24 – 2015-06-28 (×5): 20 mg via ORAL
  Filled 2015-06-23 (×5): qty 1

## 2015-06-23 NOTE — Progress Notes (Signed)
6 Days Post-Op  Subjective: Did well without NGT, ileostomy output through night  Objective: Vital signs in last 24 hours: Temp:  [98.1 F (36.7 C)-99.2 F (37.3 C)] 98.1 F (36.7 C) (06/07 [id number]) Pulse Rate:  [69-87] 85 (06/07 0638) Resp:  [18-19] 18 (06/07 0638) BP: (171-203)/(82-93) 171/93 mmHg (06/07 0638) SpO2:  [96 %-97 %] 96 % (06/07 [id number]) Weight:  [87.998 kg (194 lb)] 87.998 kg (194 lb) (06/07 [id number]) Last BM Date: 06/23/15  Intake/Output from previous day: 06/06 0701 - 06/07 0700 In: 3220.8 [I.V.:2720.8; NG/GT:100; IV Piggyback:400] Out: 2200 [Urine:1600; Emesis/NG output:250; Stool:350] Intake/Output this shift:    General appearance: alert and cooperative Resp: clear to auscultation bilaterally GI: soft, less distended, NT, incisions OK, stool in bag  Lab Results:   Recent Labs  06/22/15 0717 06/23/15 0410  WBC 15.7* 15.4*  HGB 9.7* 9.4*  HCT 31.7* 30.9*  PLT 482* 480*   BMET  Recent Labs  06/21/15 0559 06/22/15 0717  NA 142 142  K 4.2 3.7  CL 111 112*  CO2 21* 21*  GLUCOSE 108* 191*  BUN 32* 27*  CREATININE 2.77* 2.51*  CALCIUM 9.0 8.9   PT/INR No results for input(s): LABPROT, INR in the last 72 hours. ABG No results for input(s): PHART, HCO3 in the last 72 hours.  Invalid input(s): PCO2, PO2  Studies/Results: No results found.  Anti-infectives: Anti-infectives    Start     Dose/Rate Route Frequency Ordered Stop   06/19/15 1200  piperacillin-tazobactam (ZOSYN) IVPB 3.375 g  Status:  Discontinued     3.375 g 12.5 mL/hr over 240 Minutes Intravenous Every 8 hours 06/19/15 1118 06/23/15 0750   06/17/15 0700  cefoTEtan (CEFOTAN) 2 g in dextrose 5 % 50 mL IVPB     2 g 100 mL/hr over 30 Minutes Intravenous To Surgery 06/16/15 1307 06/17/15 0755   06/16/15 1400  metroNIDAZOLE (FLAGYL) tablet 1,000 mg     1,000 mg Oral Every 8 hours 06/16/15 1306 06/17/15 0625   06/16/15 1400  neomycin (MYCIFRADIN) tablet 1,000 mg     1,000 mg Oral Every 8  hours 06/16/15 1306 06/17/15 0625      Assessment/Plan: s/p Procedure(s): LAPAROSCOPIC LYSIS OF ADHESIONS for 30 minutes LAPAROSCOPIC CHOLECYSTECTOMY WITH INTRAOPERATIVE CHOLANGIOGRAM POD#6 Ileus/SBO - fulls CRI - decrease IVF FEN - above DM - SSI ID - D/C Zosyn HTN - home meds no that he is on fulls Chronic wound LLE - dressing changes VTE - Lovenox Dispo - doing well with therapies   LOS: 7 days    Denzell Colasanti E 06/23/2015

## 2015-06-24 LAB — GLUCOSE, CAPILLARY
GLUCOSE-CAPILLARY: 165 mg/dL — AB (ref 65–99)
Glucose-Capillary: 169 mg/dL — ABNORMAL HIGH (ref 65–99)
Glucose-Capillary: 202 mg/dL — ABNORMAL HIGH (ref 65–99)

## 2015-06-24 MED ORDER — PROCHLORPERAZINE EDISYLATE 5 MG/ML IJ SOLN
10.0000 mg | Freq: Four times a day (QID) | INTRAMUSCULAR | Status: DC | PRN
  Filled 2015-06-24 (×2): qty 2

## 2015-06-24 NOTE — Progress Notes (Signed)
7 Days Post-Op  Subjective: Vomited yesterday PM and this AM but feels better now. Took some fulls.  Objective: Vital signs in last 24 hours: Temp:  [98.1 F (36.7 C)-99.3 F (37.4 C)] 98.7 F (37.1 C) (06/08 0610) Pulse Rate:  [72-91] 72 (06/08 0610) Resp:  [18-20] 19 (06/08 0610) BP: (150-202)/(71-89) 175/83 mmHg (06/08 1123) SpO2:  [95 %-99 %] 97 % (06/08 0610) Last BM Date: 06/23/15  Intake/Output from previous day: 06/07 0701 - 06/08 0700 In: 1902.9 [P.O.:10; I.V.:1892.9] Out: 2125 [Urine:1650; Stool:475] Intake/Output this shift: Total I/O In: 50 [P.O.:50] Out: 500 [Urine:500]  General appearance: cooperative Resp: clear to auscultation bilaterally Cardio: S1, S2 normal GI: mild distention, large air and some stool in bag  Lab Results:   Recent Labs  06/22/15 0717 06/23/15 0410  WBC 15.7* 15.4*  HGB 9.7* 9.4*  HCT 31.7* 30.9*  PLT 482* 480*   BMET  Recent Labs  06/22/15 0717  NA 142  K 3.7  CL 112*  CO2 21*  GLUCOSE 191*  BUN 27*  CREATININE 2.51*  CALCIUM 8.9   PT/INR No results for input(s): LABPROT, INR in the last 72 hours. ABG No results for input(s): PHART, HCO3 in the last 72 hours.  Invalid input(s): PCO2, PO2  Studies/Results: No results found.  Anti-infectives: Anti-infectives    Start     Dose/Rate Route Frequency Ordered Stop   06/19/15 1200  piperacillin-tazobactam (ZOSYN) IVPB 3.375 g  Status:  Discontinued     3.375 g 12.5 mL/hr over 240 Minutes Intravenous Every 8 hours 06/19/15 1118 06/23/15 0750   06/17/15 0700  cefoTEtan (CEFOTAN) 2 g in dextrose 5 % 50 mL IVPB     2 g 100 mL/hr over 30 Minutes Intravenous To Surgery 06/16/15 1307 06/17/15 0755   06/16/15 1400  metroNIDAZOLE (FLAGYL) tablet 1,000 mg     1,000 mg Oral Every 8 hours 06/16/15 1306 06/17/15 0625   06/16/15 1400  neomycin (MYCIFRADIN) tablet 1,000 mg     1,000 mg Oral Every 8 hours 06/16/15 1306 06/17/15 0625      Assessment/Plan: s/p  Procedure(s): LAPAROSCOPIC LYSIS OF ADHESIONS for 30 minutes LAPAROSCOPIC CHOLECYSTECTOMY WITH INTRAOPERATIVE CHOLANGIOGRAM POD#7 Ileus/SBO - slow improvement. Antiemetics, fulls just as able CRI - BMET in AM, IVF 50cc/h FEN - above DM - SSI HTN - home meds Chronic wound LLE - dressing changes VTE - Lovenox Dispo - doing well with therapies   LOS: 8 days    Sage Kopera E 06/24/2015

## 2015-06-25 LAB — BASIC METABOLIC PANEL
ANION GAP: 8 (ref 5–15)
BUN: 36 mg/dL — AB (ref 6–20)
CALCIUM: 9.1 mg/dL (ref 8.9–10.3)
CO2: 21 mmol/L — AB (ref 22–32)
CREATININE: 2.4 mg/dL — AB (ref 0.61–1.24)
Chloride: 115 mmol/L — ABNORMAL HIGH (ref 101–111)
GFR calc Af Amer: 30 mL/min — ABNORMAL LOW (ref >60)
GFR, EST NON AFRICAN AMERICAN: 26 mL/min — AB (ref >60)
GLUCOSE: 137 mg/dL — AB (ref 65–99)
Potassium: 3.5 mmol/L (ref 3.5–5.1)
Sodium: 144 mmol/L (ref 135–145)

## 2015-06-25 LAB — CBC
HEMATOCRIT: 29.8 % — AB (ref 39.0–52.0)
Hemoglobin: 8.9 g/dL — ABNORMAL LOW (ref 13.0–17.0)
MCH: 31.3 pg (ref 26.0–34.0)
MCHC: 29.9 g/dL — AB (ref 30.0–36.0)
MCV: 104.9 fL — AB (ref 78.0–100.0)
PLATELETS: 488 10*3/uL — AB (ref 150–400)
RBC: 2.84 MIL/uL — ABNORMAL LOW (ref 4.22–5.81)
RDW: 13.6 % (ref 11.5–15.5)
WBC: 21.6 10*3/uL — AB (ref 4.0–10.5)

## 2015-06-25 LAB — GLUCOSE, CAPILLARY
GLUCOSE-CAPILLARY: 153 mg/dL — AB (ref 65–99)
GLUCOSE-CAPILLARY: 171 mg/dL — AB (ref 65–99)
Glucose-Capillary: 170 mg/dL — ABNORMAL HIGH (ref 65–99)
Glucose-Capillary: 173 mg/dL — ABNORMAL HIGH (ref 65–99)

## 2015-06-25 LAB — URINALYSIS, ROUTINE W REFLEX MICROSCOPIC
Bilirubin Urine: NEGATIVE
GLUCOSE, UA: NEGATIVE mg/dL
Hgb urine dipstick: NEGATIVE
Ketones, ur: NEGATIVE mg/dL
LEUKOCYTES UA: NEGATIVE
NITRITE: NEGATIVE
PH: 5.5 (ref 5.0–8.0)
Protein, ur: NEGATIVE mg/dL
SPECIFIC GRAVITY, URINE: 1.015 (ref 1.005–1.030)

## 2015-06-25 MED ORDER — HYDRALAZINE HCL 20 MG/ML IJ SOLN
10.0000 mg | INTRAMUSCULAR | Status: DC | PRN
  Administered 2015-06-25 – 2015-06-26 (×3): 10 mg via INTRAVENOUS
  Filled 2015-06-25 (×2): qty 1

## 2015-06-25 NOTE — Progress Notes (Signed)
   06/25/15 1230  Vitals  Temp 98.8 F (37.1 C)  Temp Source Oral  BP (!) 181/80 mmHg  BP Location Left Arm  BP Method Automatic  Patient Position (if appropriate) Lying  Pulse Rate 65  Pulse Rate Source Dinamap  Resp 18  Oxygen Therapy  SpO2 97 %  O2 Device Room Air  prn hydralazine administered for high blood pressure as ordered

## 2015-06-25 NOTE — Progress Notes (Signed)
8 Days Post-Op  Subjective: Nausea overnight but no more emesis. Taking sips of fulls. Lots of ileostomy output this AM - bag leaked. No abdominal pain.  Objective: Vital signs in last 24 hours: Temp:  [98.3 F (36.8 C)-99.1 F (37.3 C)] 98.5 F (36.9 C) (06/09 0629) Pulse Rate:  [54-76] 76 (06/09 0629) Resp:  [17-20] 17 (06/08 2256) BP: (175-200)/(74-83) 181/79 mmHg (06/09 0629) SpO2:  [97 %] 97 % (06/09 0629) Last BM Date: 06/23/15  Intake/Output from previous day: 06/08 0701 - 06/09 0700 In: 1160.8 [P.O.:290; I.V.:870.8] Out: 2450 [Urine:1200; Emesis/NG output:1200; Stool:50] Intake/Output this shift:    General appearance: cooperative Resp: clear to auscultation bilaterally Cardio: regular rate and rhythm GI: softer, less distended, NT, air and stool from ileostomy  Lab Results:   Recent Labs  06/23/15 0410 06/25/15 0311  WBC 15.4* 21.6*  HGB 9.4* 8.9*  HCT 30.9* 29.8*  PLT 480* 488*   BMET  Recent Labs  06/25/15 0311  NA 144  K 3.5  CL 115*  CO2 21*  GLUCOSE 137*  BUN 36*  CREATININE 2.40*  CALCIUM 9.1   PT/INR No results for input(s): LABPROT, INR in the last 72 hours. ABG No results for input(s): PHART, HCO3 in the last 72 hours.  Invalid input(s): PCO2, PO2  Studies/Results: No results found.  Anti-infectives: Anti-infectives    Start     Dose/Rate Route Frequency Ordered Stop   06/19/15 1200  piperacillin-tazobactam (ZOSYN) IVPB 3.375 g  Status:  Discontinued     3.375 g 12.5 mL/hr over 240 Minutes Intravenous Every 8 hours 06/19/15 1118 06/23/15 0750   06/17/15 0700  cefoTEtan (CEFOTAN) 2 g in dextrose 5 % 50 mL IVPB     2 g 100 mL/hr over 30 Minutes Intravenous To Surgery 06/16/15 1307 06/17/15 0755   06/16/15 1400  metroNIDAZOLE (FLAGYL) tablet 1,000 mg     1,000 mg Oral Every 8 hours 06/16/15 1306 06/17/15 0625   06/16/15 1400  neomycin (MYCIFRADIN) tablet 1,000 mg     1,000 mg Oral Every 8 hours 06/16/15 1306 06/17/15 0625       Assessment/Plan: s/p Procedure(s): LAPAROSCOPIC LYSIS OF ADHESIONS for 30 minutes LAPAROSCOPIC CHOLECYSTECTOMY WITH INTRAOPERATIVE CHOLANGIOGRAM POD#8 Ileus/SBO - slow improvement. Antiemetics, fulls just as able CRI - CRT better, IVF 50cc/h Leukocytosis - 15-24k since surgery. Check U/A and CX. CT A/P over weekend if worsens FEN - above DM - SSI HTN - home meds, increase PRN Hydralazine to Q4 Chronic wound LLE - dressing changes VTE - Lovenox Dispo - doing well with therapies  LOS: 9 days    Carmon Brigandi E 06/25/2015

## 2015-06-26 LAB — URINE CULTURE
CULTURE: NO GROWTH
Special Requests: NORMAL

## 2015-06-26 LAB — GLUCOSE, CAPILLARY
GLUCOSE-CAPILLARY: 186 mg/dL — AB (ref 65–99)
GLUCOSE-CAPILLARY: 116 mg/dL — AB (ref 65–99)
Glucose-Capillary: 141 mg/dL — ABNORMAL HIGH (ref 65–99)

## 2015-06-26 NOTE — Progress Notes (Addendum)
Patient eattng well today, good urine output. He accidentally pulled IV out at 1500 when up in room. Will hold fluids as intake good and no IV meds.

## 2015-06-26 NOTE — Progress Notes (Signed)
9 Days Post-Op  Subjective: Better, no N/V, good ileostomy output  Objective: Vital signs in last 24 hours: Temp:  [98.1 F (36.7 C)-98.8 F (37.1 C)] 98.1 F (36.7 C) (06/10 0506) Pulse Rate:  [65-76] 70 (06/10 0506) Resp:  [18-19] 19 (06/10 0506) BP: (156-183)/(71-80) 183/76 mmHg (06/10 0506) SpO2:  [97 %-98 %] 98 % (06/10 0506) Last BM Date: 06/24/15  Intake/Output from previous day: 06/09 0701 - 06/10 0700 In: 1350 [P.O.:200; I.V.:1150] Out: 1250 [Urine:1000; Stool:250] Intake/Output this shift: Total I/O In: 1150 [I.V.:1150] Out: 600 [Urine:500; Stool:100]  General appearance: cooperative Resp: clear to auscultation bilaterally Cardio: regular rate and rhythm GI: softer, NT, ileostomy output good  Lab Results:   Recent Labs  06/25/15 0311  WBC 21.6*  HGB 8.9*  HCT 29.8*  PLT 488*   BMET  Recent Labs  06/25/15 0311  NA 144  K 3.5  CL 115*  CO2 21*  GLUCOSE 137*  BUN 36*  CREATININE 2.40*  CALCIUM 9.1   PT/INR No results for input(s): LABPROT, INR in the last 72 hours. ABG No results for input(s): PHART, HCO3 in the last 72 hours.  Invalid input(s): PCO2, PO2  Studies/Results: No results found.  Anti-infectives: Anti-infectives    Start     Dose/Rate Route Frequency Ordered Stop   06/19/15 1200  piperacillin-tazobactam (ZOSYN) IVPB 3.375 g  Status:  Discontinued     3.375 g 12.5 mL/hr over 240 Minutes Intravenous Every 8 hours 06/19/15 1118 06/23/15 0750   06/17/15 0700  cefoTEtan (CEFOTAN) 2 g in dextrose 5 % 50 mL IVPB     2 g 100 mL/hr over 30 Minutes Intravenous To Surgery 06/16/15 1307 06/17/15 0755   06/16/15 1400  metroNIDAZOLE (FLAGYL) tablet 1,000 mg     1,000 mg Oral Every 8 hours 06/16/15 1306 06/17/15 0625   06/16/15 1400  neomycin (MYCIFRADIN) tablet 1,000 mg     1,000 mg Oral Every 8 hours 06/16/15 1306 06/17/15 0625      Assessment/Plan: s/p Procedure(s): LAPAROSCOPIC LYSIS OF ADHESIONS for 30 minutes LAPAROSCOPIC  CHOLECYSTECTOMY WITH INTRAOPERATIVE CHOLANGIOGRAM POD#9 Ileus/SBO - slow improvement. Soft diet as tolerated today CRI - CRT better, IVF 50cc/h Leukocytosis - 15-24k since surgery. CBC P, U/A neg, abdominal exam not concerning FEN - above DM - SSI HTN - home meds, increased PRN Hydralazine to Q4 6/9 Chronic wound LLE - dressing changes VTE - Lovenox Dispo - doing well with therapies, home once eating  LOS: 10 days    Teller Wakefield E 06/26/2015

## 2015-06-27 LAB — GLUCOSE, CAPILLARY
GLUCOSE-CAPILLARY: 132 mg/dL — AB (ref 65–99)
GLUCOSE-CAPILLARY: 141 mg/dL — AB (ref 65–99)
GLUCOSE-CAPILLARY: 169 mg/dL — AB (ref 65–99)
Glucose-Capillary: 122 mg/dL — ABNORMAL HIGH (ref 65–99)
Glucose-Capillary: 172 mg/dL — ABNORMAL HIGH (ref 65–99)
Glucose-Capillary: 185 mg/dL — ABNORMAL HIGH (ref 65–99)

## 2015-06-27 MED ORDER — INSULIN ASPART 100 UNIT/ML ~~LOC~~ SOLN
0.0000 [IU] | Freq: Three times a day (TID) | SUBCUTANEOUS | Status: DC
  Administered 2015-06-27: 2 [IU] via SUBCUTANEOUS
  Administered 2015-06-27: 3 [IU] via SUBCUTANEOUS
  Administered 2015-06-27: 2 [IU] via SUBCUTANEOUS
  Administered 2015-06-28: 3 [IU] via SUBCUTANEOUS

## 2015-06-27 NOTE — Progress Notes (Signed)
Patient ID: Gabriel Lynch, male   DOB: 04/01/1945, 70 y.o.   MRN: [id number] 10 Days Post-Op  Subjective: Did great with food, no nausea  Objective: Vital signs in last 24 hours: Temp:  [98 F (36.7 C)-98.7 F (37.1 C)] 98 F (36.7 C) (06/11 0550) Pulse Rate:  [64-75] 64 (06/11 0550) Resp:  [18-19] 19 (06/11 0550) BP: (144-182)/(63-74) 144/74 mmHg (06/11 0550) SpO2:  [97 %-98 %] 97 % (06/11 0550) Last BM Date: 06/26/15  Intake/Output from previous day: 06/10 0701 - 06/11 0700 In: 1480 [P.O.:1180; I.V.:300] Out: 3275 [Urine:1850; [id number] Intake/Output this shift: Total I/O In: 120 [P.O.:120] Out: -   A&O Lungs CTA CV reg Abd softer, NT, good ileostomy output  Lab Results: CBC   Recent Labs  06/25/15 0311  WBC 21.6*  HGB 8.9*  HCT 29.8*  PLT 488*   BMET  Recent Labs  06/25/15 0311  NA 144  K 3.5  CL 115*  CO2 21*  GLUCOSE 137*  BUN 36*  CREATININE 2.40*  CALCIUM 9.1   PT/INR No results for input(s): LABPROT, INR in the last 72 hours. ABG No results for input(s): PHART, HCO3 in the last 72 hours.  Invalid input(s): PCO2, PO2  Studies/Results: No results found.  Anti-infectives: Anti-infectives    Start     Dose/Rate Route Frequency Ordered Stop   06/19/15 1200  piperacillin-tazobactam (ZOSYN) IVPB 3.375 g  Status:  Discontinued     3.375 g 12.5 mL/hr over 240 Minutes Intravenous Every 8 hours 06/19/15 1118 06/23/15 0750   06/17/15 0700  cefoTEtan (CEFOTAN) 2 g in dextrose 5 % 50 mL IVPB     2 g 100 mL/hr over 30 Minutes Intravenous To Surgery 06/16/15 1307 06/17/15 0755   06/16/15 1400  metroNIDAZOLE (FLAGYL) tablet 1,000 mg     1,000 mg Oral Every 8 hours 06/16/15 1306 06/17/15 0625   06/16/15 1400  neomycin (MYCIFRADIN) tablet 1,000 mg     1,000 mg Oral Every 8 hours 06/16/15 1306 06/17/15 0625      Assessment/Plan: s/p Procedure(s): LAPAROSCOPIC LYSIS OF ADHESIONS for 30 minutes LAPAROSCOPIC CHOLECYSTECTOMY WITH INTRAOPERATIVE  CHOLANGIOGRAM POD#10 Ileus/SBO - slow improvement. Soft diet as tolerated today CRI - CRT better FEN - above DM - SSI HTN - home meds, increased PRN Hydralazine to Q4 6/9 Chronic wound LLE - dressing changes VTE - Lovenox Dispo - doing well with therapies, home in AM   LOS: 11 days    Georganna Skeans, MD, MPH, FACS Trauma: [phone] General Surgery: [phone]  06/27/2015

## 2015-06-27 NOTE — Progress Notes (Signed)
Pt. Stated his appetite went away

## 2015-06-28 LAB — GLUCOSE, CAPILLARY: Glucose-Capillary: 155 mg/dL — ABNORMAL HIGH (ref 65–99)

## 2015-06-28 NOTE — Progress Notes (Signed)
Patient discharged to home with instructions. 

## 2015-06-28 NOTE — Discharge Summary (Signed)
Physician Discharge Summary  Patient ID: Gabriel Lynch MRN: [id number] DOB/AGE: 03-17-1945 70 y.o.  Admit date: 06/16/2015 Discharge date: 06/28/2015  Admission Diagnoses:chronic cholecystitis, ileostomy in place  Discharge Diagnoses: S/P laparoscopic cholecystectomy and lysis of adhesions Active Problems:   Cholecystitis, chronic   Discharged Condition: good  Hospital Course: Demetruis underwent surgery as above. Pelvic scar tissue precluded ileostomy takedown. He did get a prolonged post-op ileus but this resolved and he is ready for D/C. His CKD was managed well with hydration.  Consults: None  Significant Diagnostic Studies: labs: CRT  Treatments: surgery: above  Discharge Exam: Blood pressure 151/72, pulse 65, temperature 98.7 F (37.1 C), temperature source Oral, resp. rate 16, height 6\' 1"  (1.854 m), weight 87.998 kg (194 lb), SpO2 97 %. General appearance: cooperative Resp: clear to auscultation bilaterally Cardio: S1, S2 normal GI: soft, incisions CDI, ileostomy working well  Disposition: 01-Home or Self Care  Discharge Instructions    Diet - low sodium heart healthy    Complete by:  As directed      Discharge instructions    Complete by:  As directed   [facility] LAPAROSCOPIC SURGERY: POST OP INSTRUCTIONS Always review your discharge instruction sheet given to you by the facility where your surgery was performed. IF YOU HAVE DISABILITY OR FAMILY LEAVE FORMS, YOU MUST BRING THEM TO THE OFFICE FOR PROCESSING.   DO NOT GIVE THEM TO YOUR DOCTOR.  A prescription for pain medication may be given to you upon discharge.  Take your pain medication as prescribed, if needed.  If narcotic pain medicine is not needed, then you may take acetaminophen (Tylenol) or ibuprofen (Advil) as needed. Take your usually prescribed medications unless otherwise directed. If you need a refill on your pain medication, please contact your pharmacy.  They will  contact our office to request authorization. Prescriptions will not be filled after 5pm or on week-ends. You should follow a light diet the first few days after arrival home, such as soup and crackers, etc.  Be sure to include lots of fluids daily. Most patients will experience some swelling and bruising in the area of the incisions.  Ice packs will help.  Swelling and bruising can take several days to resolve.  It is common to experience some constipation if taking pain medication after surgery.  Increasing fluid intake and taking a stool softener (such as Colace) will usually help or prevent this problem from occurring.  A mild laxative (Milk of Magnesia or Miralax) should be taken according to package instructions if there are no bowel movements after 48 hours. Unless discharge instructions indicate otherwise, you may remove your bandages 24-48 hours after surgery, and you may shower at that time.  You may have steri-strips (small skin tapes) in place directly over the incision.  These strips should be left on the skin for 7-10 days.  If your surgeon used skin glue on the incision, you may shower in 24 hours.  The glue will flake off over the next 2-3 weeks.  Any sutures or staples will be removed at the office during your follow-up visit. ACTIVITIES:  You may resume regular (light) daily activities beginning the next day-such as daily self-care, walking, climbing stairs-gradually increasing activities as tolerated.  You may have sexual intercourse when it is comfortable.  Refrain from any heavy lifting or straining until approved by your doctor. You may drive when you are no longer taking prescription pain medication, you can comfortably wear a seatbelt, and you  can safely maneuver your car and apply brakes. RETURN TO WORK:  __________________________________________________________ Dennis Bast should see your doctor in the office for a follow-up appointment approximately 2-3 weeks after your surgery.  Make sure  that you call for this appointment within a day or two after you arrive home to insure a convenient appointment time. OTHER INSTRUCTIONS: __________________________________________________________________________________________________________________________ __________________________________________________________________________________________________________________________ WHEN TO CALL YOUR DOCTOR: Fever over 101.0 Inability to urinate Continued bleeding from incision. Increased pain, redness, or drainage from the incision. Increasing abdominal pain  The clinic staff is available to answer your questions during regular business hours.  Please don't hesitate to call and ask to speak to one of the nurses for clinical concerns.  If you have a medical emergency, go to the nearest emergency room or call 911.  A surgeon from [facility] is always on call at the hospital. [address] [place], [place], [place]  [address] ? [address], [place], [place]   [address] [phone] ? [phone] ? FAX [phone] Web site: [email or web address]     Increase activity slowly    Complete by:  As directed      Lifting restrictions    Complete by:  As directed   Do not lift over 10lbs for 3 weeks     No dressing needed    Complete by:  As directed             Medication List    TAKE these medications        amLODipine 10 MG tablet  Commonly known as:  NORVASC  Take 10 mg by mouth daily.     calcium acetate 667 MG capsule  Commonly known as:  PHOSLO  Take 667 mg by mouth daily.     carvedilol 12.5 MG tablet  Commonly known as:  COREG  Take 12.5 mg by mouth 2 (two) times daily with a meal.     cholestyramine 4 g packet  Commonly known as:  QUESTRAN  Take 1 packet (4 g total) by mouth daily.     clobetasol cream 0.05 %  Commonly known as:  TEMOVATE  Apply 1 application topically daily.     ferrous sulfate 325 (65 FE) MG tablet  Take 325 mg by  mouth daily with breakfast.     finasteride 5 MG tablet  Commonly known as:  PROSCAR  Take 5 mg by mouth daily.     levothyroxine 100 MCG tablet  Commonly known as:  SYNTHROID, LEVOTHROID  Take 100 mcg by mouth daily before breakfast.     methocarbamol 500 MG tablet  Commonly known as:  ROBAXIN  Take 1 tablet (500 mg total) by mouth every 8 (eight) hours as needed for muscle spasms.     mirtazapine 15 MG tablet  Commonly known as:  REMERON  Take 15 mg by mouth at bedtime.     ondansetron 4 MG tablet  Commonly known as:  ZOFRAN  Take 4 mg by mouth every 4 (four) hours as needed for nausea or vomiting.     oxyCODONE 5 MG immediate release tablet  Commonly known as:  Oxy IR/ROXICODONE  Take 1-2 tablets (5-10 mg total) by mouth every 4 (four) hours as needed for moderate pain or severe pain.     tamsulosin 0.4 MG Caps capsule  Commonly known as:  FLOMAX  Take 2 capsules (0.8 mg total) by mouth every other day.           Follow-up Information  Follow up with [facility], MD In 2 weeks.   Specialty:  General Surgery   Why:  My office will call Monday to confirm F/U appointment   Contact information:   [place] [address] [phone]       Signed: Zenovia Jarred 06/28/2015, 6:17 AM

## 2015-07-06 ENCOUNTER — Other Ambulatory Visit: Payer: Self-pay | Admitting: Adult Health

## 2016-01-20 ENCOUNTER — Other Ambulatory Visit (HOSPITAL_COMMUNITY): Payer: Self-pay | Admitting: *Deleted

## 2016-01-21 ENCOUNTER — Inpatient Hospital Stay (HOSPITAL_COMMUNITY)
Admission: RE | Admit: 2016-01-21 | Discharge: 2016-01-21 | Disposition: A | Discharge status: Home / Self Care | Payer: Medicare Other | Source: Ambulatory Visit | Attending: Nephrology | Admitting: Nephrology

## 2016-06-04 IMAGING — CT CT HEAD W/O CM
3 of 10 series · 13 of 47 positions shown, 15 images · non-contrast
Comparison: None.

ADDENDUM:
On axial slice 56 series 302, there is a 1.6 x 1.3 cm mass in the
superficial soft tissues at the level of the angle of the mandible
on the left. Suspect sebaceous cyst, although a focal lymph node
could present in this manner.
CLINICAL DATA: Patient unresponsive

EXAM:
CT HEAD WITHOUT CONTRAST
CT CERVICAL SPINE WITHOUT CONTRAST
TECHNIQUE: Multidetector CT imaging of the head and cervical spine was
performed following the standard protocol without intravenous
contrast. Multiplanar CT image reconstructions of the cervical spine
were also generated.

[Series 302: soft tissue, idose (1) · axial · 0.39mm/px · z∈[+772,+904]mm · 4 of 110 slices shown]
[im 22/110  brain]
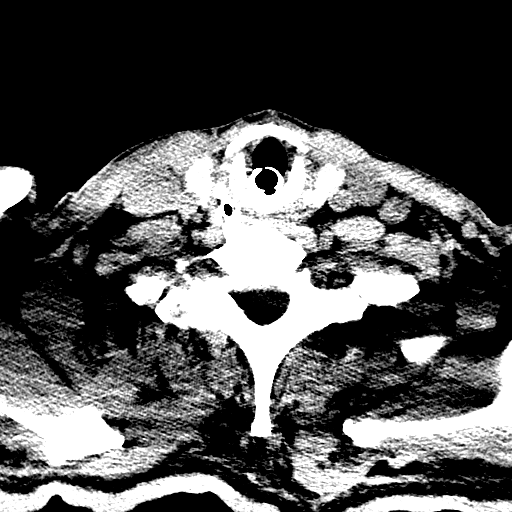
[im 44/110  brain]
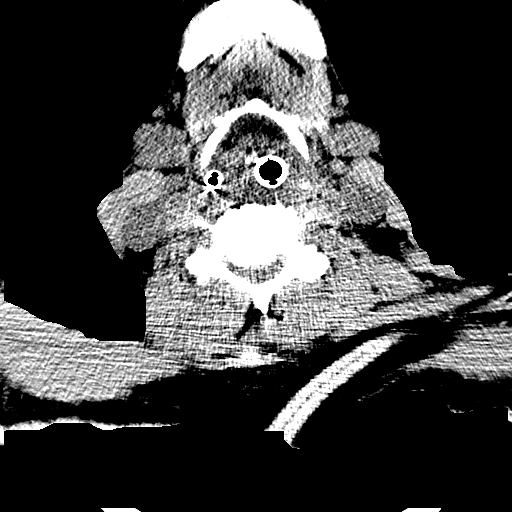
[im 66/110  brain]
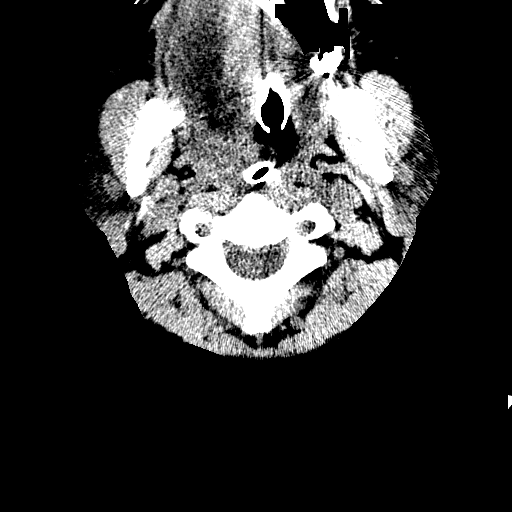
[im 88/110  brain]
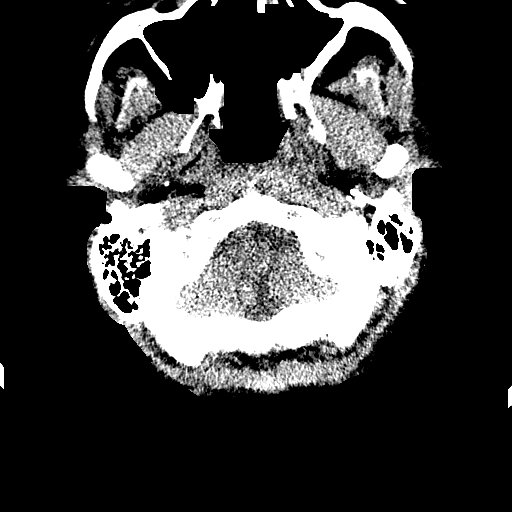

[Series 304: coronal, idose (1) · coronal · 0.34mm/px · 3 of 87 slices shown]
[im 22/87  brain]
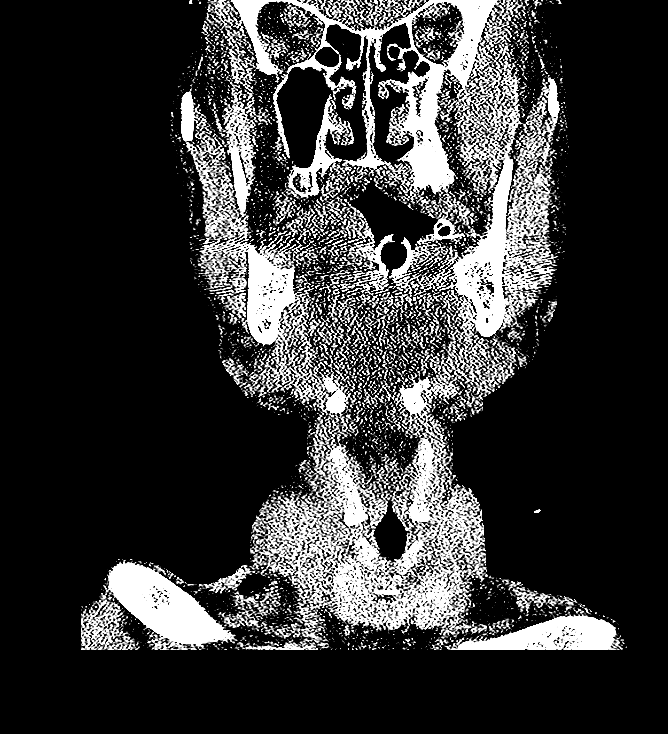
[im 44/87  brain]
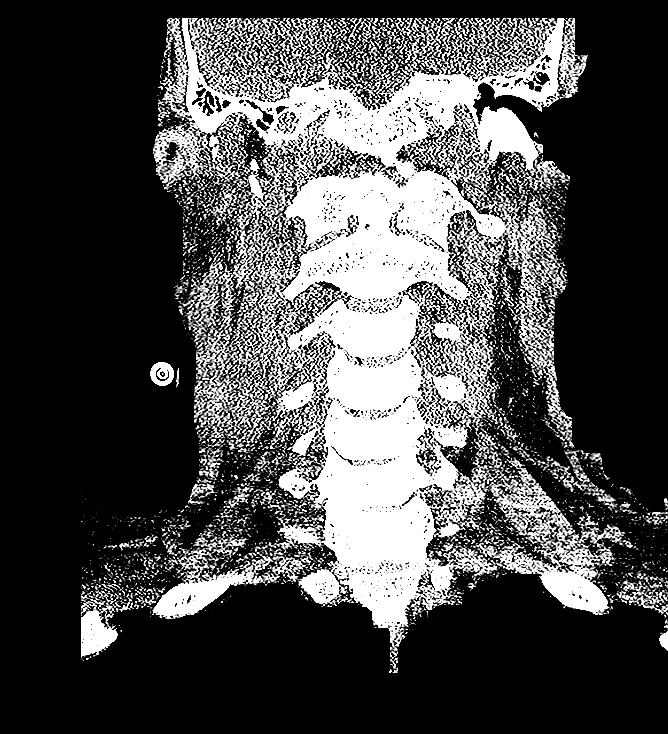
[im 65/87  brain]
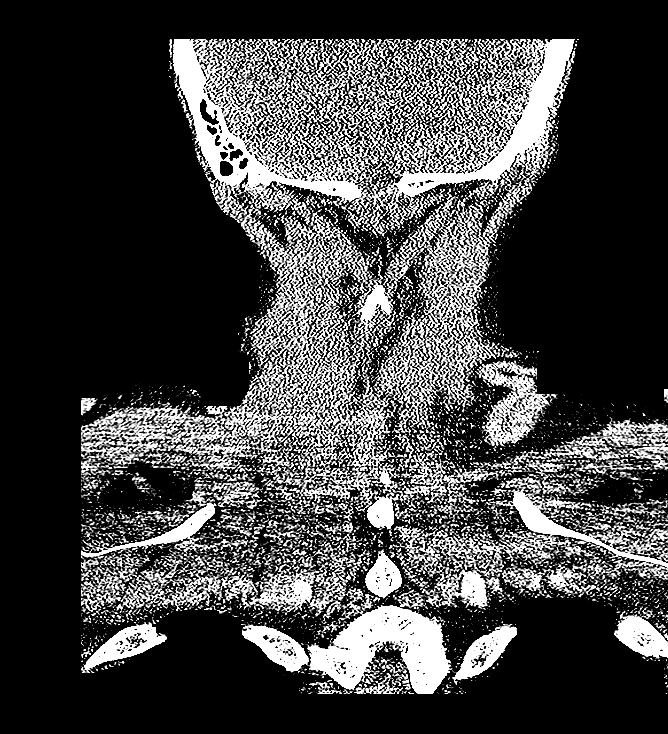

[Series 401: trauma cap, idose (2) · axial · 0.76mm/px · z∈[+216,+691]mm · 6 of 135 slices shown, 8 images]
[im 20/135  brain]
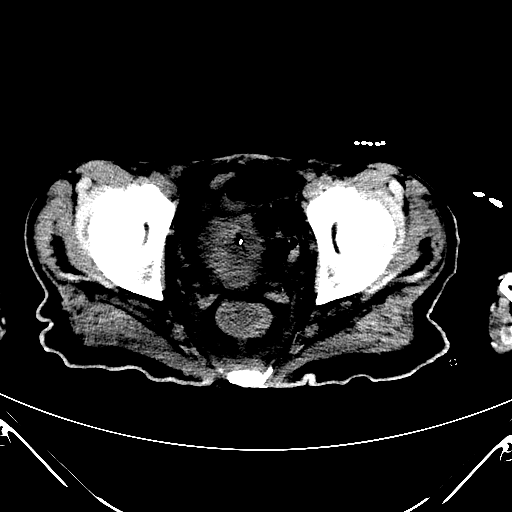
[im 20/135  bone]
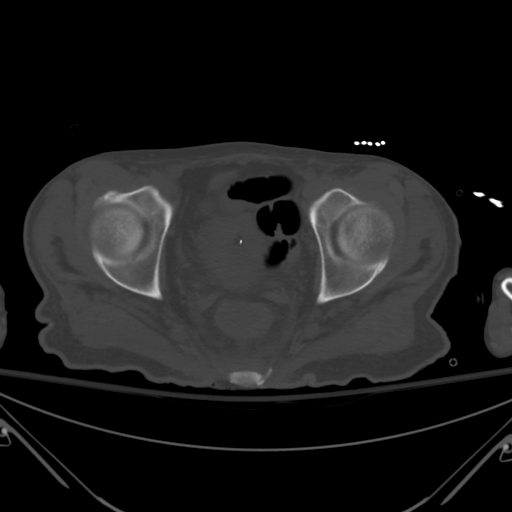
[im 39/135  brain]
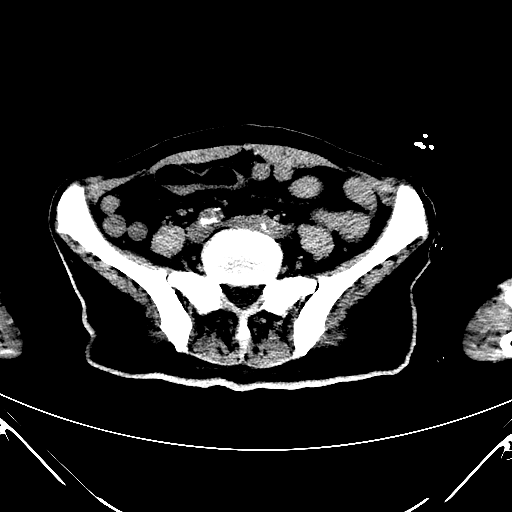
[im 58/135  brain]
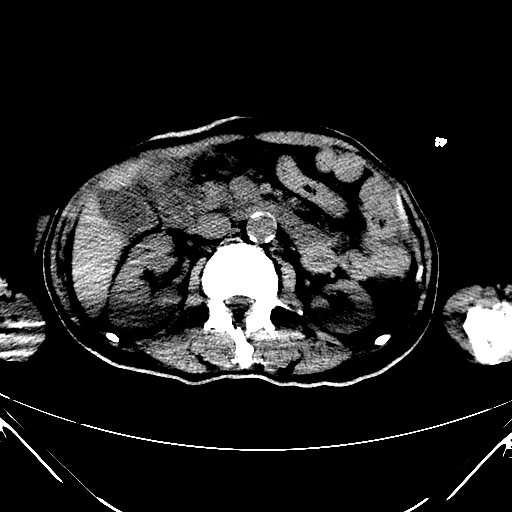
[im 77/135  brain]
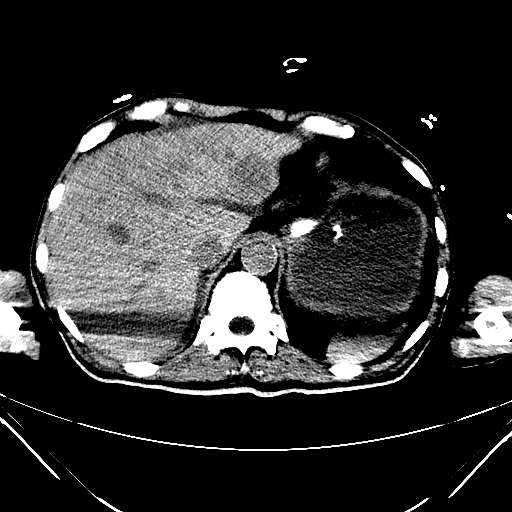
[im 96/135  brain]
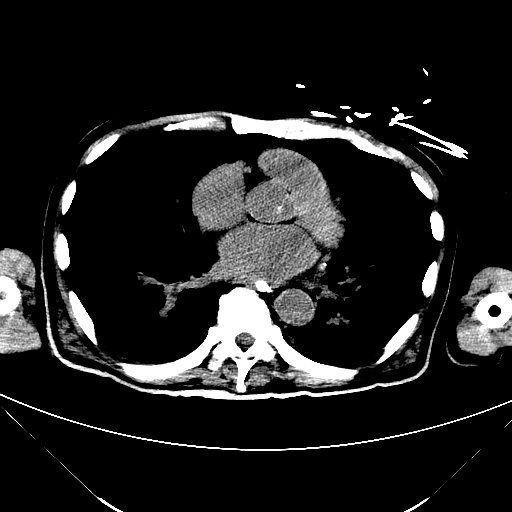
[im 96/135  bone]
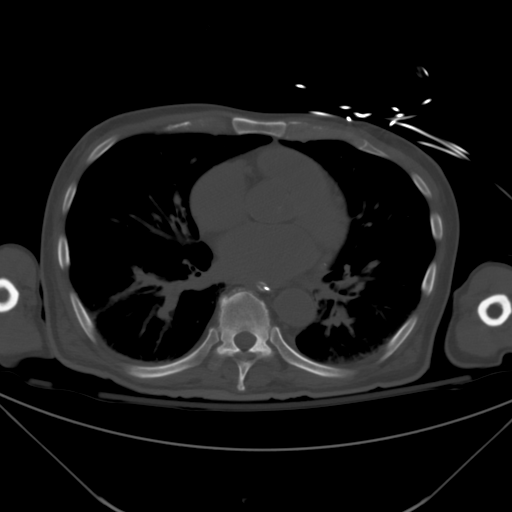
[im 115/135  brain]
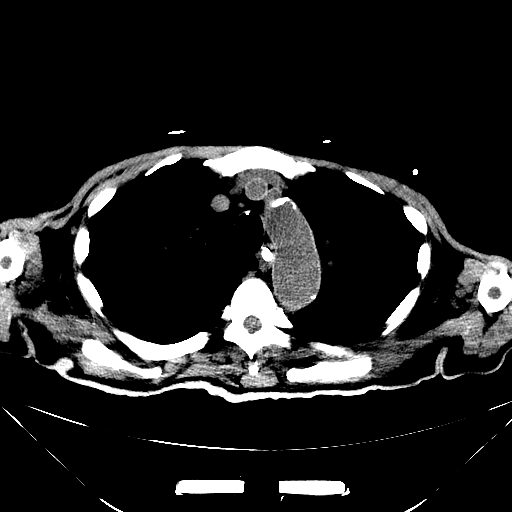

[13 of 47 positions shown; findings below may reference images not displayed]

FINDINGS: CT HEAD FINDINGS

The ventricles are normal in size and configuration. There is no
appreciable intracranial mass, hemorrhage, extra-axial fluid
collection, or midline shift. There is a probable prominent
prevascular space just lateral to the inferior aspect of the
posterior limb of the left internal capsule. Gray-white compartments
appear normal. No acute infarct apparent. Bony calvarium appears
intact. The mastoid air cells are clear. The patient has had an
antrostomy on the left. There is mucosal thickening in the inferior
left maxillary antrum. There is a well-circumscribed rounded bony
defect in the upper anterior hard palate. There is a prosthetic
globe on the right. There is evidence of previous surgery involving
the anterior aspect of the right maxillary antrum wall.

CT CERVICAL SPINE FINDINGS

There is no fracture or spondylolisthesis. Prevertebral soft tissues
and predental space regions are normal. Patient is intubated.

There is moderate disc space narrowing at C4-5, C5-6, and C6-7.
There is mild narrowing at C3-4. There are central disc protrusions
causing impression on the ventral cord at C3-4, C4-5, and C5-6. A
smaller central disc protrusion is noted at C2-3. There is a degree
of spinal stenosis due to the central disc protrusions at C3-4,
C4-5, and C5-6. No erosive change or bony destruction.
IMPRESSION: CT head: No intracranial mass, hemorrhage, or extra-axial fluid
collection. No focal gray-white compartment lesions/acute appearing
infarct. Postoperative change in the face. Prosthetic right globe.
Well-circumscribed bony defect anterior hard palate. Etiology for
this defect is uncertain. It should be amenable to visual
inspection.

CT cervical spine: Multifocal osteoarthritic change. There is a
degree of spinal stenosis due to central disc protrusion at C3-4,
C4-5, and C5-6. No fracture or spondylolisthesis. No erosive change
or bony destruction.

## 2016-07-01 IMAGING — CR DG CHEST 1V PORT
1 series · 1 of 1 positions shown · non-contrast
Comparison: [DATE]

CLINICAL DATA: Central line placement

EXAM:
PORTABLE CHEST - 1 VIEW

[portable]
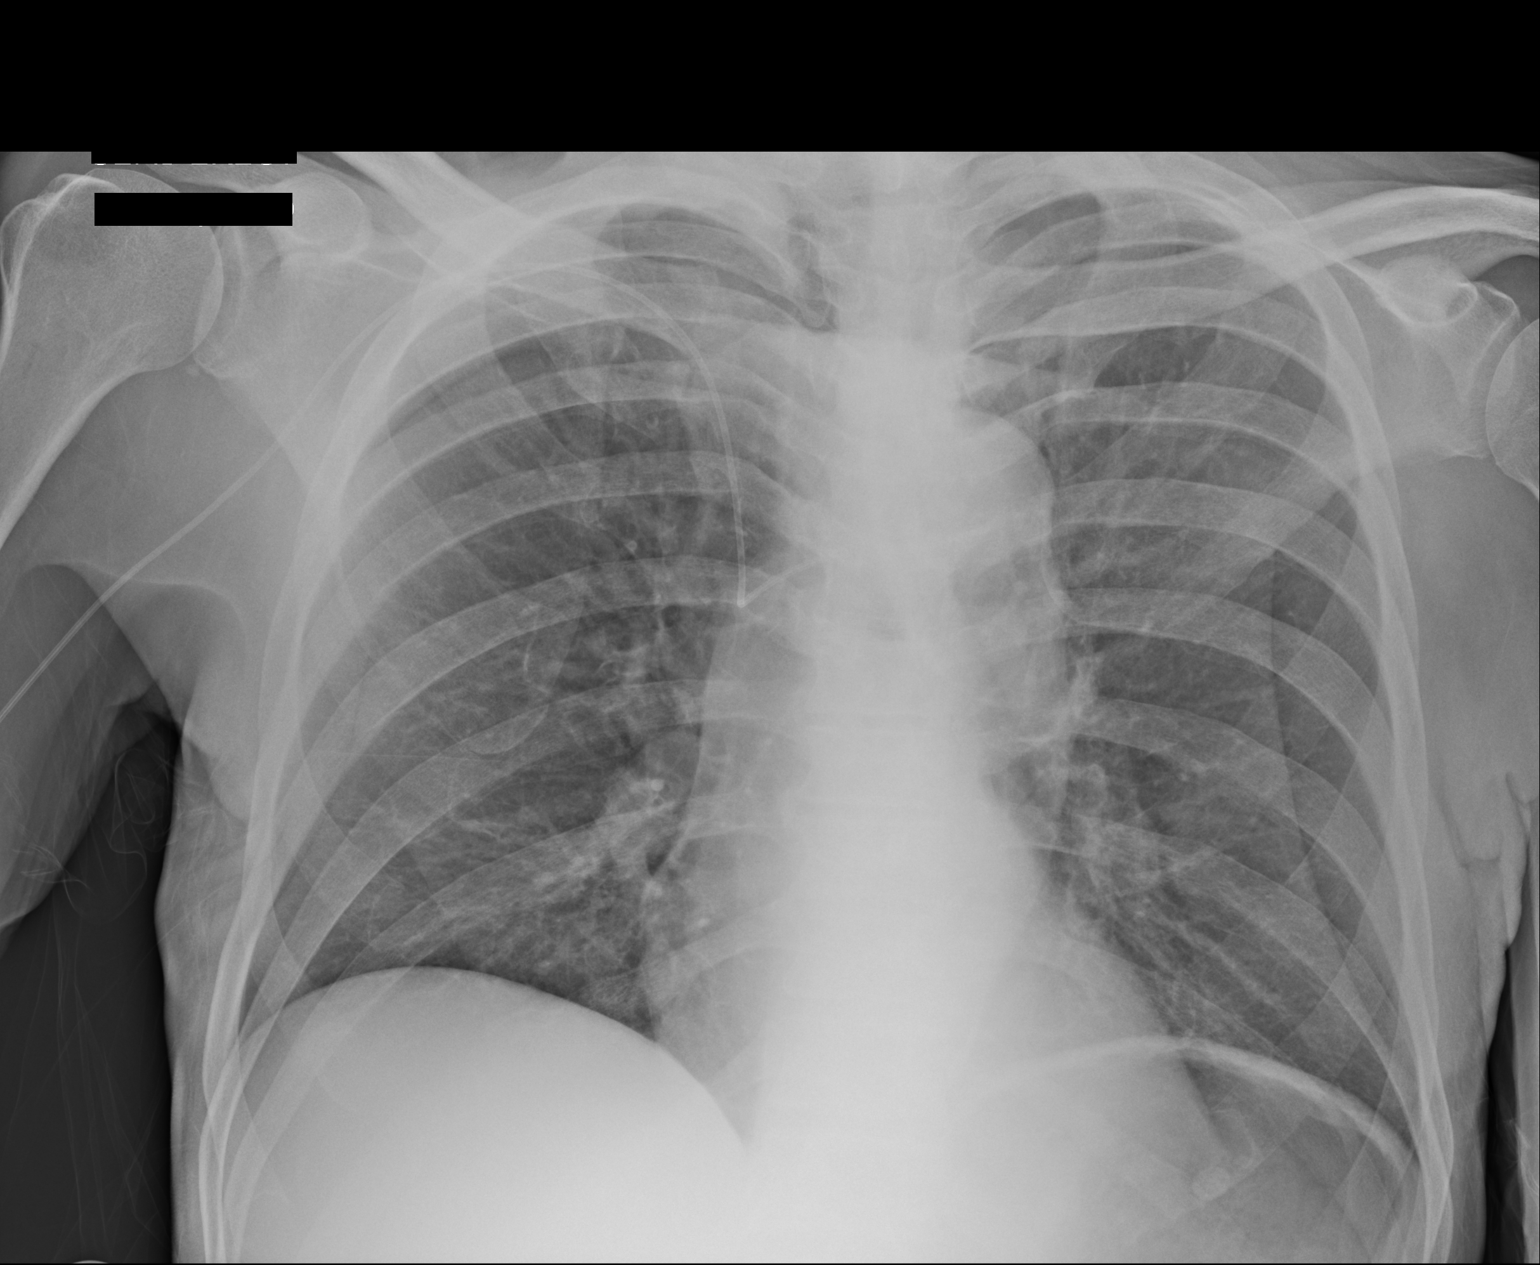

[1 of 1 positions shown; findings below may reference images not displayed]

FINDINGS: Right PICC line has been placed. It extends inferiorly in the
superior vena cava and then extends to the left for about 3. No
pneumothorax. Lungs clear. Heart size normal.
IMPRESSION: Right PICC line extends about 3 into the left brachiocephalic vein
based on appearance. The line should be retracted about 5 cm and
then re- advanced with repeat chest radiograph.

These results will be called to the ordering clinician or
representative by the Radiologist Assistant, and communication
documented in the PACS or zVision Dashboard.

## 2016-07-01 IMAGING — CR DG CHEST 1V PORT
1 series · 1 of 1 positions shown · non-contrast
Comparison: 04/02/2014

CLINICAL DATA: PICC placement

EXAM:
PORTABLE CHEST - 1 VIEW

[AP]
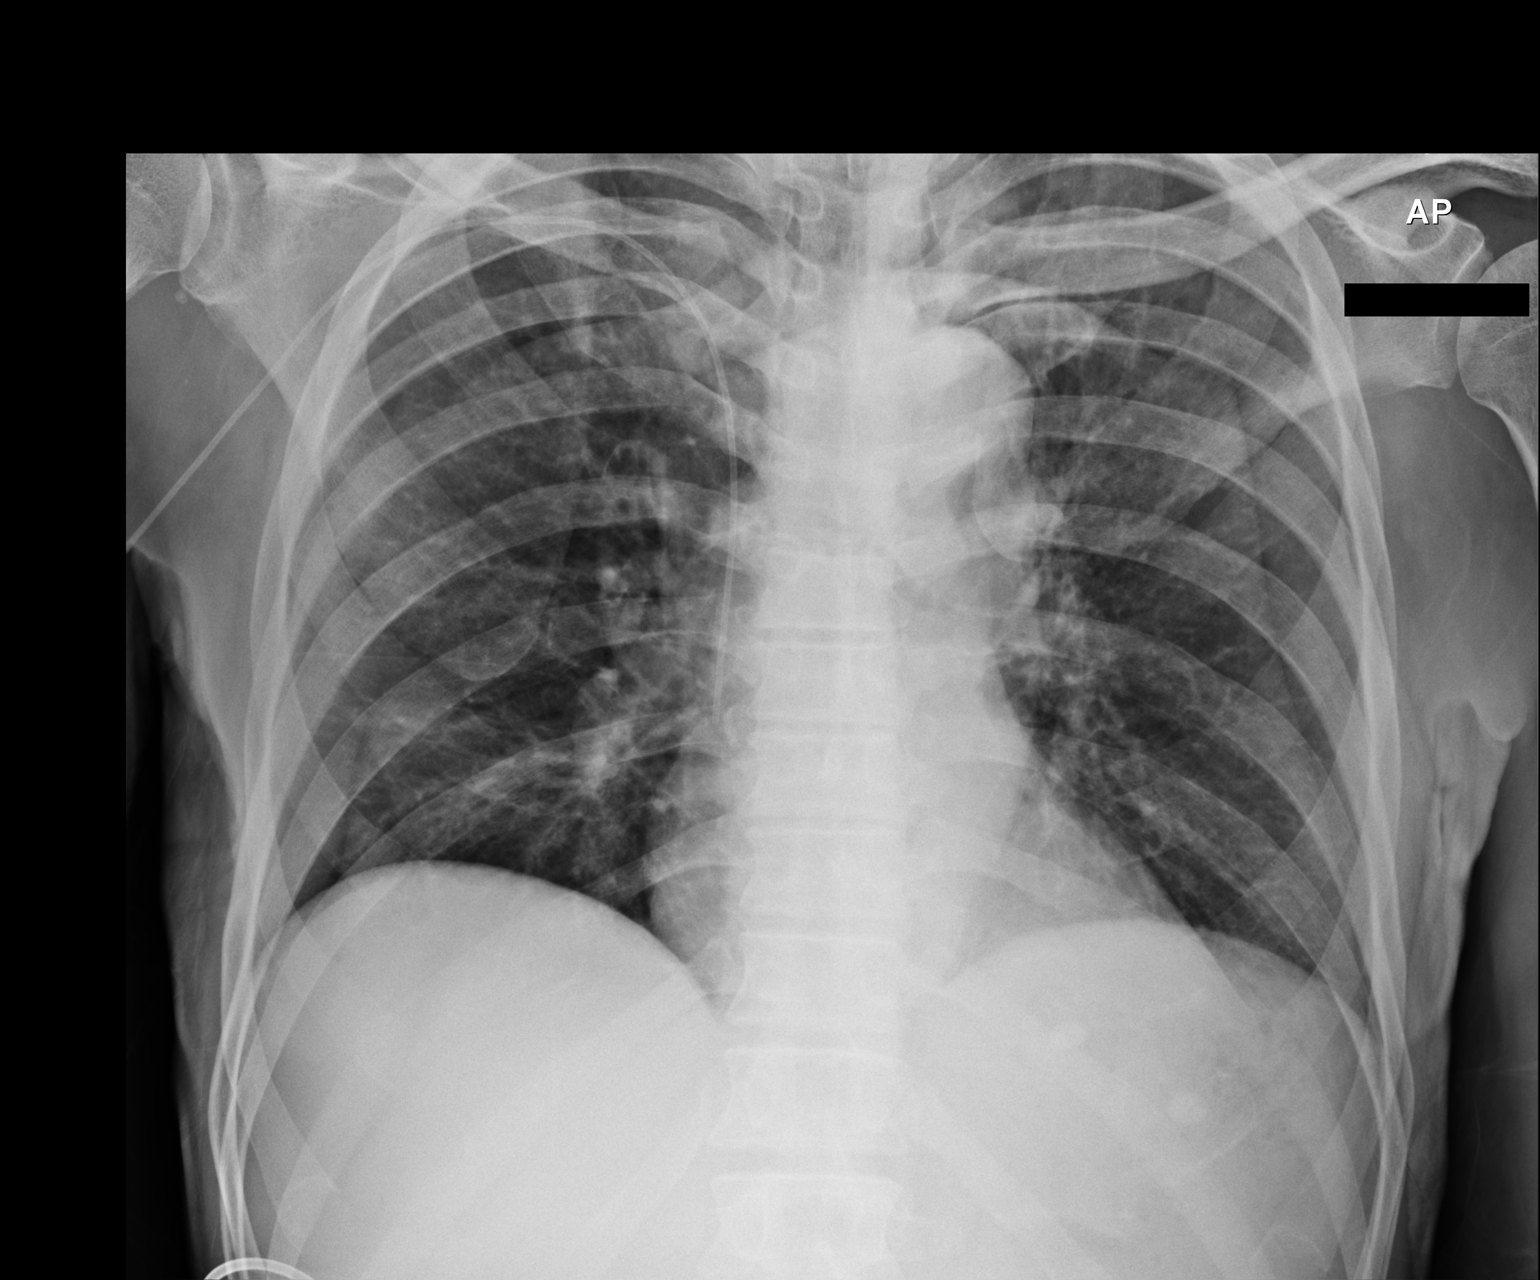

[1 of 1 positions shown; findings below may reference images not displayed]

FINDINGS: PICC line has been repositioned with the tip now at the cavoatrial
junction in good position. Tip no longer in the left brachiocephalic
vein.

Lungs are clear.
IMPRESSION: PICC line now in good position at the cavoatrial junction.

## 2016-07-01 IMAGING — CR DG CHEST 1V PORT
2 series · 2 of 2 positions shown · non-contrast
Comparison: Film performed [id number] hr

CLINICAL DATA: Re-evaluation of right PICC line placement

EXAM:
PORTABLE CHEST - 1 VIEW

[portable (1 of 2)]
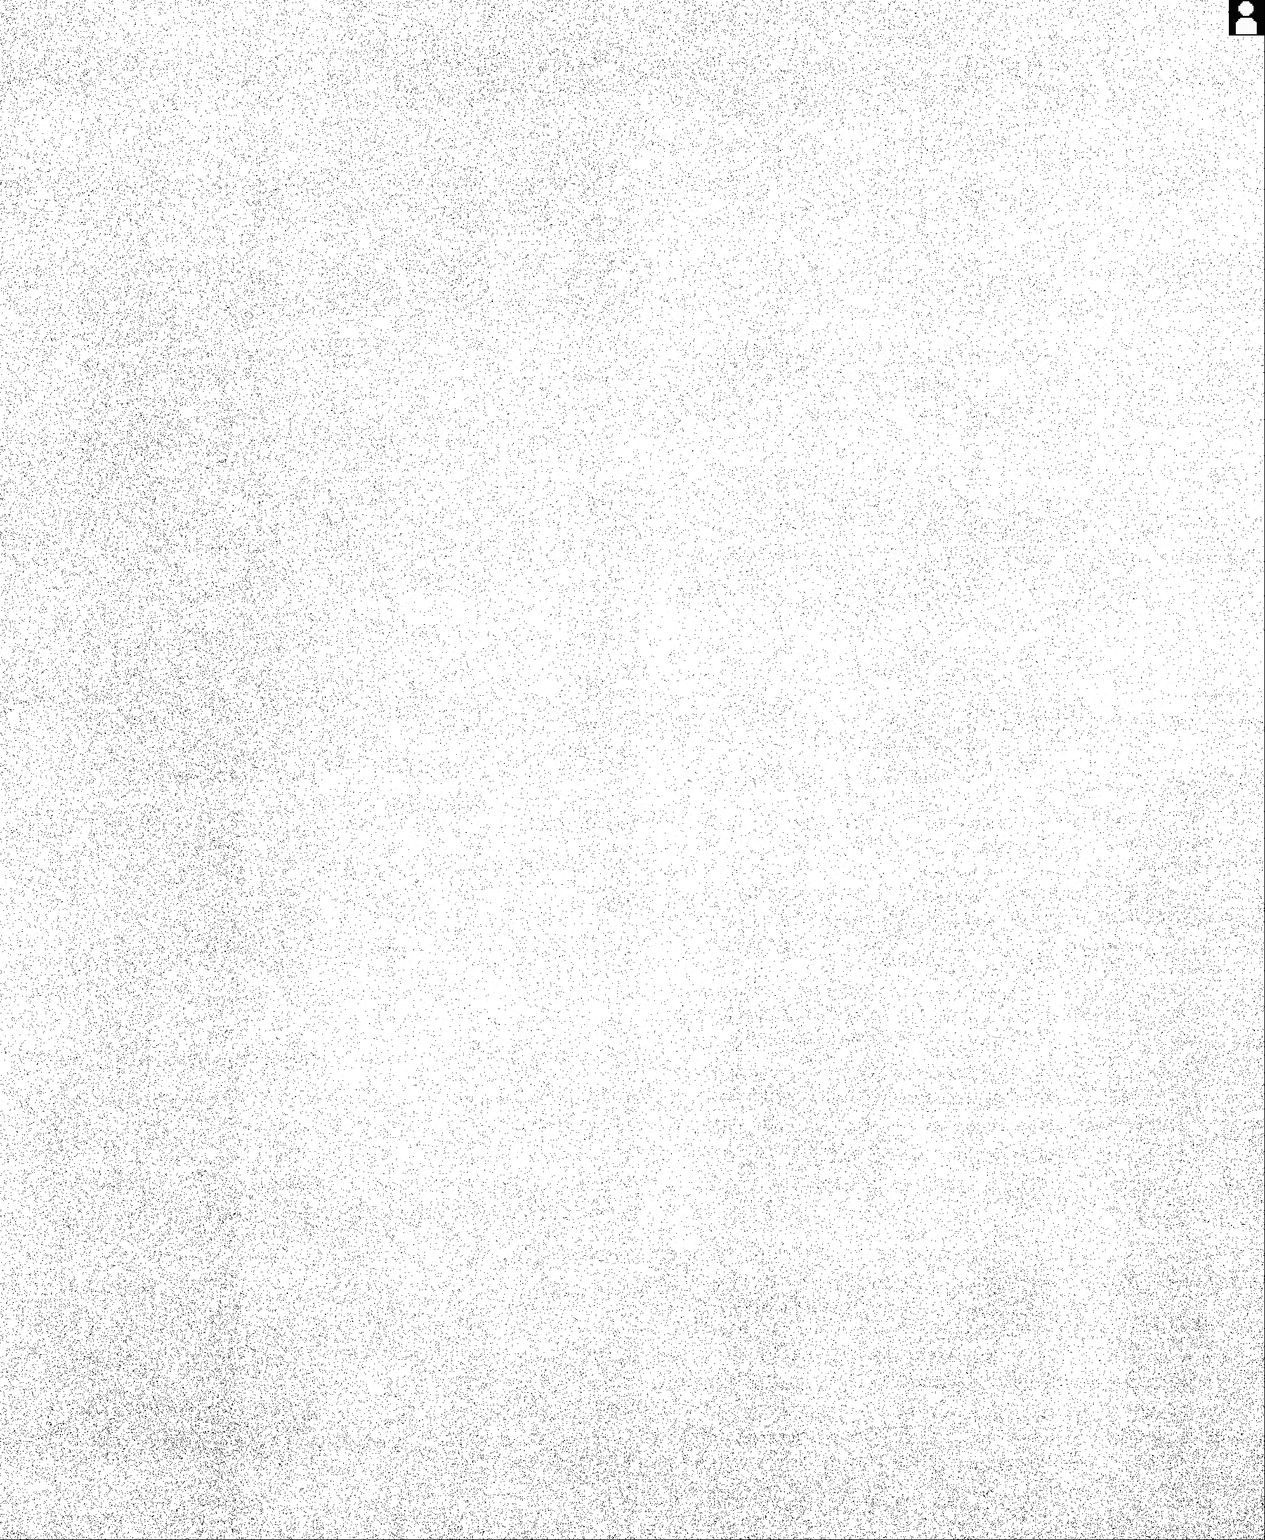

[portable (2 of 2)]
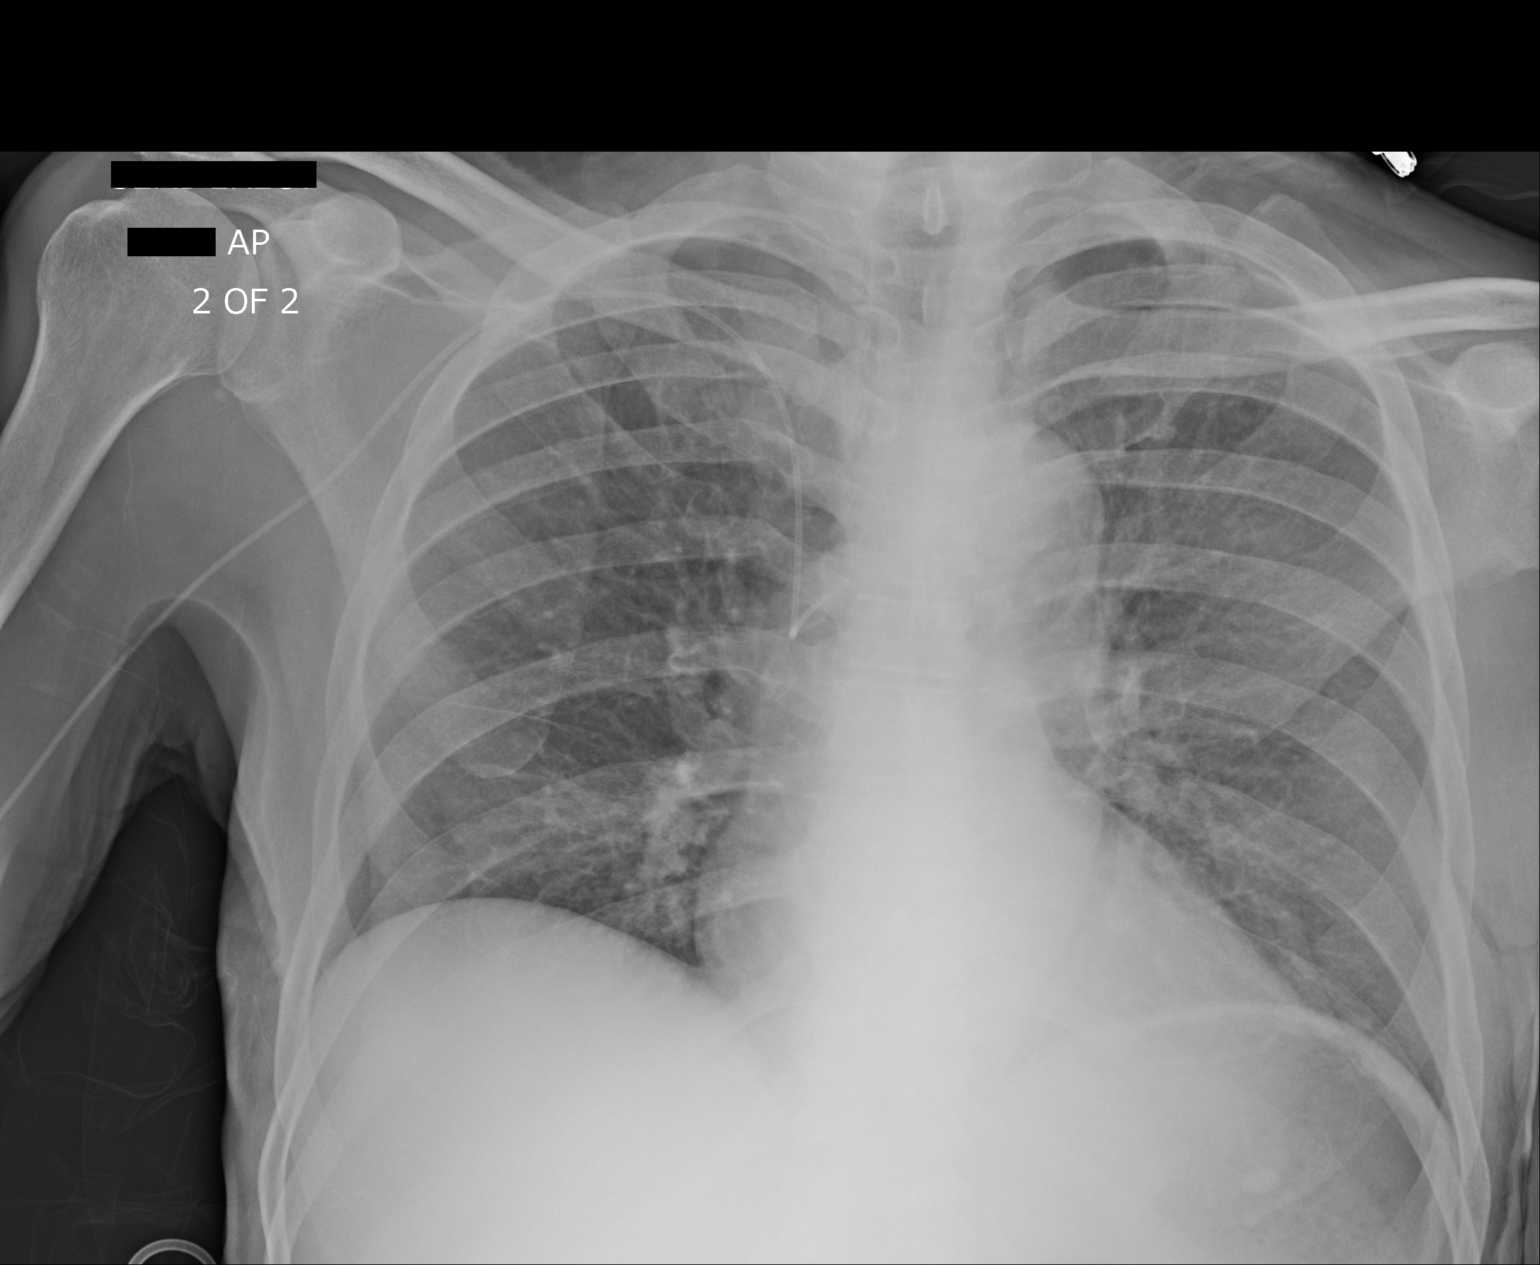

[2 of 2 positions shown; findings below may reference images not displayed]

FINDINGS: Right PICC line now extends 13 mm into left brachiocephalic vein as
opposed to 32 mm previously. No pneumothorax. Skin fold over the
lower right thorax noted.
IMPRESSION: PICC line again an in appropriate position should be retracted and
repositioned with repeat imaging.

These results will be called to the ordering clinician or
representative by the Radiologist Assistant, and communication
documented in the PACS or zVision Dashboard.
